# Patient Record
Sex: Female | Born: 1965
Health system: Southern US, Community
[De-identification: ages and names within clinical notes are randomized; demographics above are authoritative.]

## PROBLEM LIST (undated history)

## (undated) DIAGNOSIS — E7800 Pure hypercholesterolemia, unspecified: Secondary | ICD-10-CM

## (undated) DIAGNOSIS — R8789 Other abnormal findings in specimens from female genital organs: Secondary | ICD-10-CM

## (undated) DIAGNOSIS — M51369 Other intervertebral disc degeneration, lumbar region without mention of lumbar back pain or lower extremity pain: Secondary | ICD-10-CM

## (undated) DIAGNOSIS — M00062 Staphylococcal arthritis, left knee: Secondary | ICD-10-CM

## (undated) DIAGNOSIS — I2692 Saddle embolus of pulmonary artery without acute cor pulmonale: Secondary | ICD-10-CM

## (undated) DIAGNOSIS — M5136 Other intervertebral disc degeneration, lumbar region: Secondary | ICD-10-CM

## (undated) DIAGNOSIS — Z9289 Personal history of other medical treatment: Secondary | ICD-10-CM

## (undated) DIAGNOSIS — Z1211 Encounter for screening for malignant neoplasm of colon: Secondary | ICD-10-CM

## (undated) DIAGNOSIS — M5126 Other intervertebral disc displacement, lumbar region: Secondary | ICD-10-CM

## (undated) DIAGNOSIS — I10 Essential (primary) hypertension: Secondary | ICD-10-CM

## (undated) DIAGNOSIS — M1712 Unilateral primary osteoarthritis, left knee: Secondary | ICD-10-CM

## (undated) DIAGNOSIS — E118 Type 2 diabetes mellitus with unspecified complications: Secondary | ICD-10-CM

## (undated) DIAGNOSIS — I82409 Acute embolism and thrombosis of unspecified deep veins of unspecified lower extremity: Secondary | ICD-10-CM

## (undated) HISTORY — DX: Other intervertebral disc degeneration, lumbar region: M51.36

## (undated) HISTORY — DX: Other abnormal findings in specimens from female genital organs: R87.89

## (undated) HISTORY — DX: Other intervertebral disc degeneration, lumbar region without mention of lumbar back pain or lower extremity pain: M51.369

## (undated) HISTORY — DX: Encounter for screening for malignant neoplasm of colon: Z12.11

## (undated) HISTORY — DX: Essential (primary) hypertension: I10

## (undated) HISTORY — DX: Other intervertebral disc displacement, lumbar region: M51.26

## (undated) HISTORY — DX: Personal history of other medical treatment: Z92.89

---

## 2010-11-17 ENCOUNTER — Ambulatory Visit: Payer: Self-pay

## 2010-11-29 ENCOUNTER — Ambulatory Visit: Payer: Self-pay

## 2011-06-22 ENCOUNTER — Ambulatory Visit: Payer: Self-pay

## 2011-11-30 ENCOUNTER — Ambulatory Visit: Payer: Self-pay

## 2012-12-02 ENCOUNTER — Ambulatory Visit: Payer: Self-pay

## 2012-12-02 IMAGING — MG MM CAD DIAGNOSTIC MAMMO
1 series · 8 of 8 positions shown · non-contrast
Comparison: none

REASON FOR EXAM: LT BRST ASYMMETRY FU AND YRLY
COMMENTS:

PROCEDURE:     MAM - MAM DGTL DIAGNOSTIC MAMMO W/CAD  - [DATE]  [DATE]
RESULT:     COMPARISON:  [DATE], [DATE], [DATE]
TECHNIQUE: Digital screening mammograms were obtained. FDA approved
computer-aided detection (CAD) for mammography was utilized for this study.
BREAST COMPOSITION: The breast composition is SCATTERED FIBROGLANDULAR
TISSUE (glandular tissue is  25-50%)

[R CC · right · 8 of 8 slices shown]
[im 1/8]
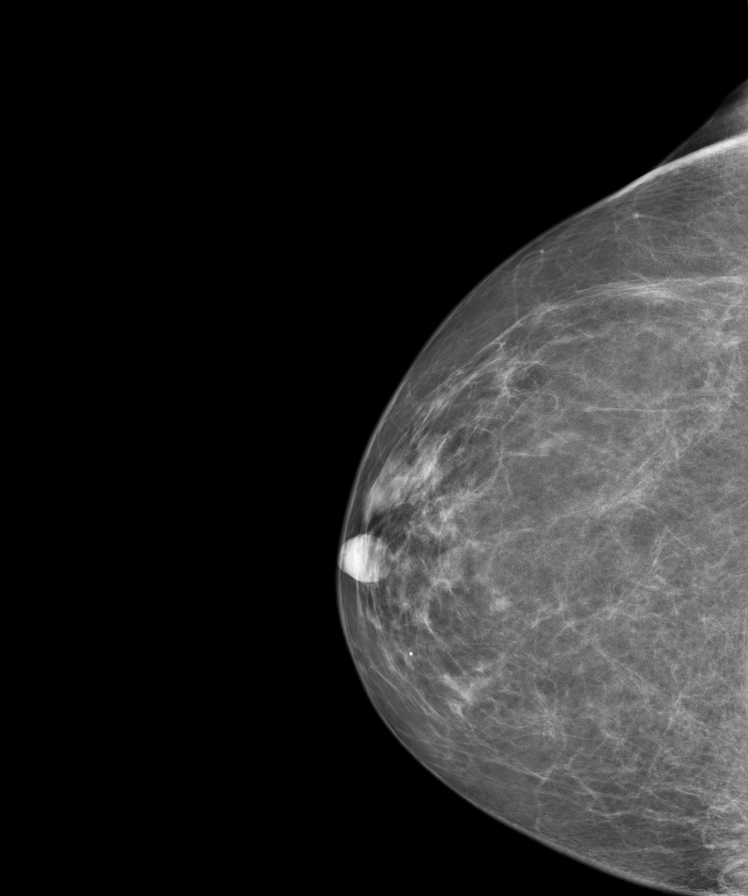
[im 2/8]
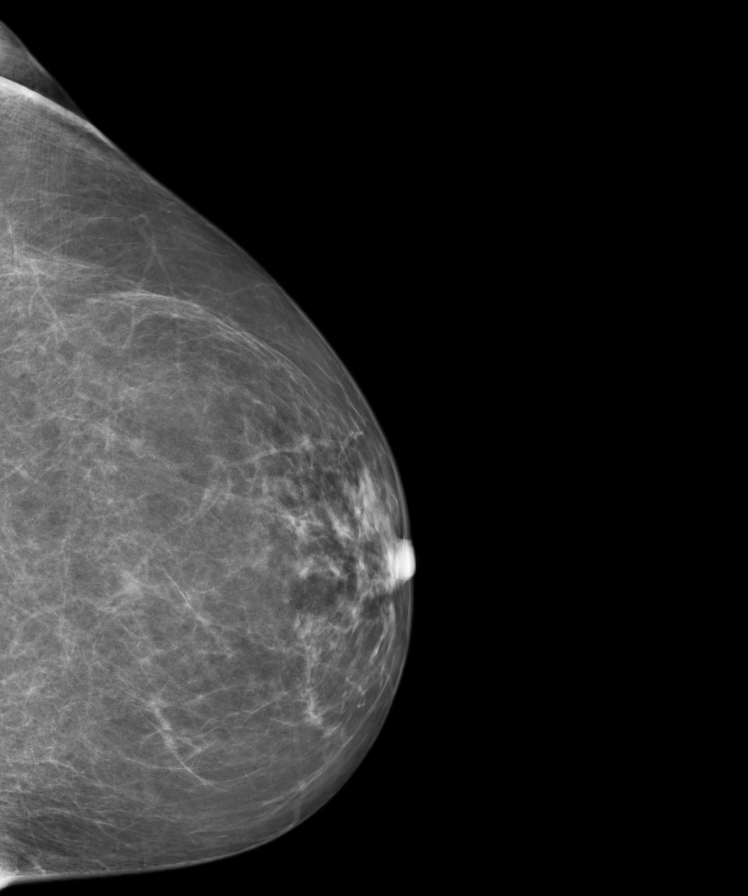
[im 3/8]
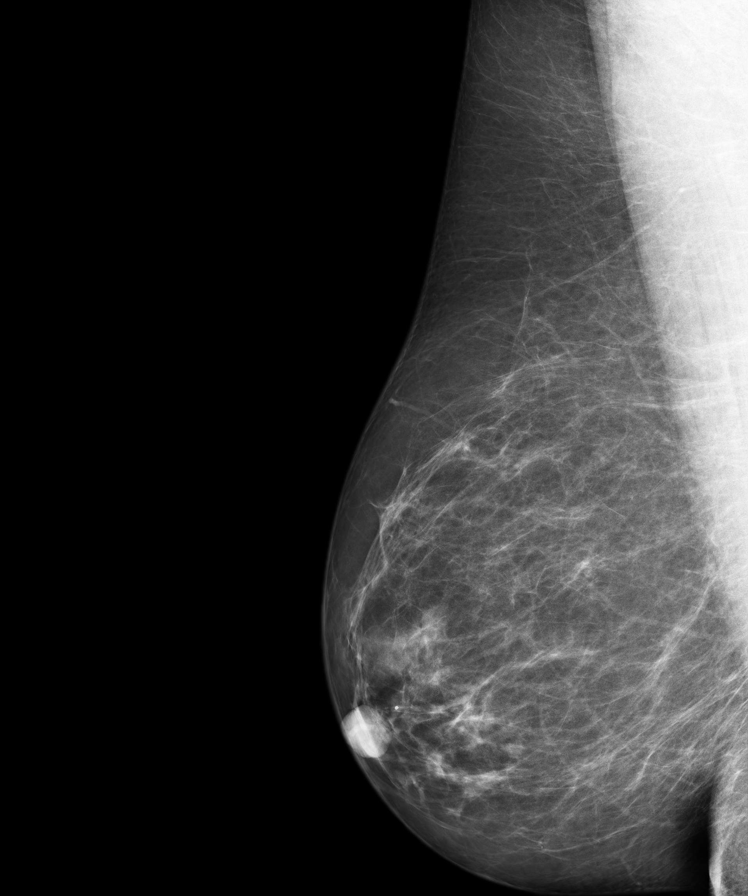
[im 4/8]
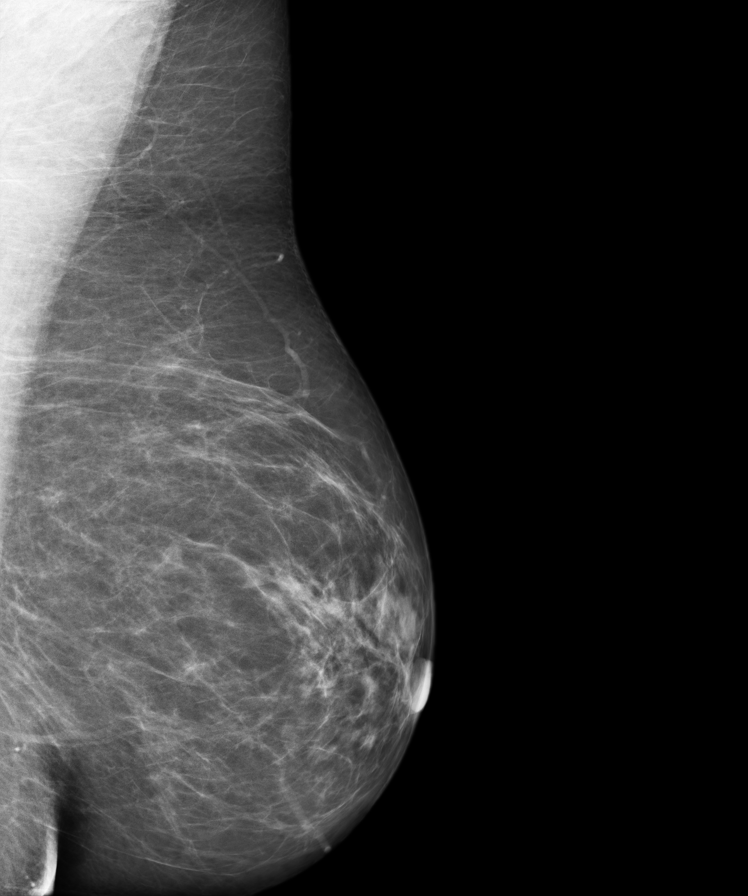
[im 5/8]
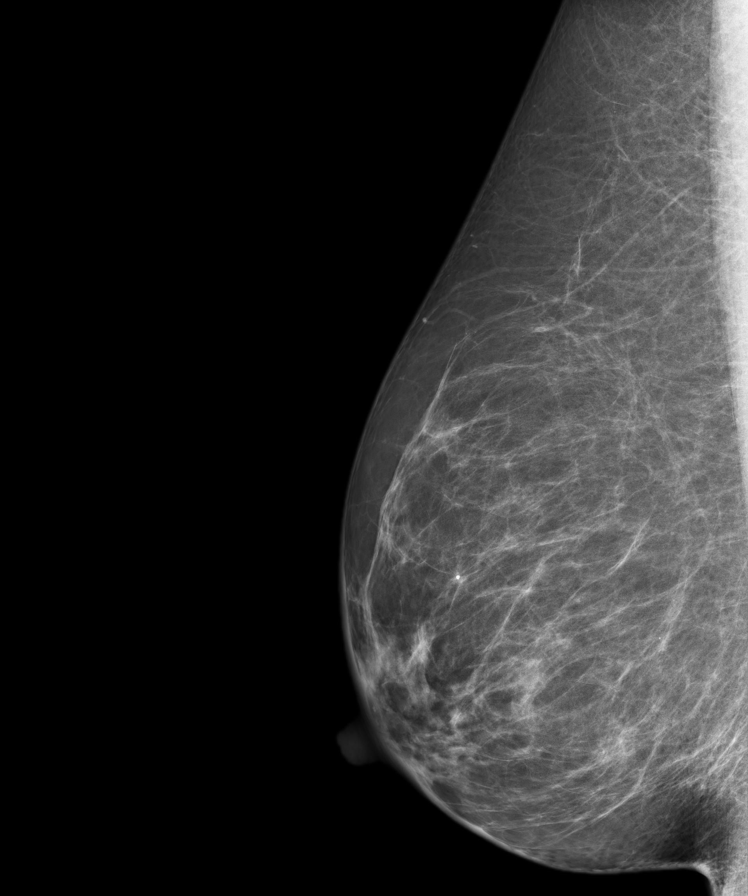
[im 6/8]
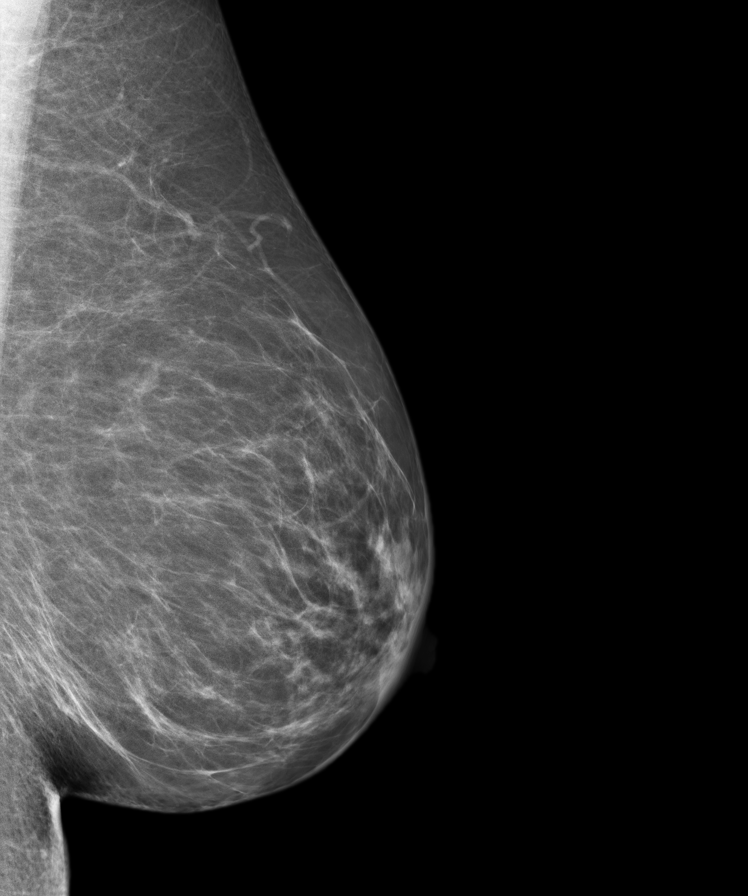
[im 7/8]
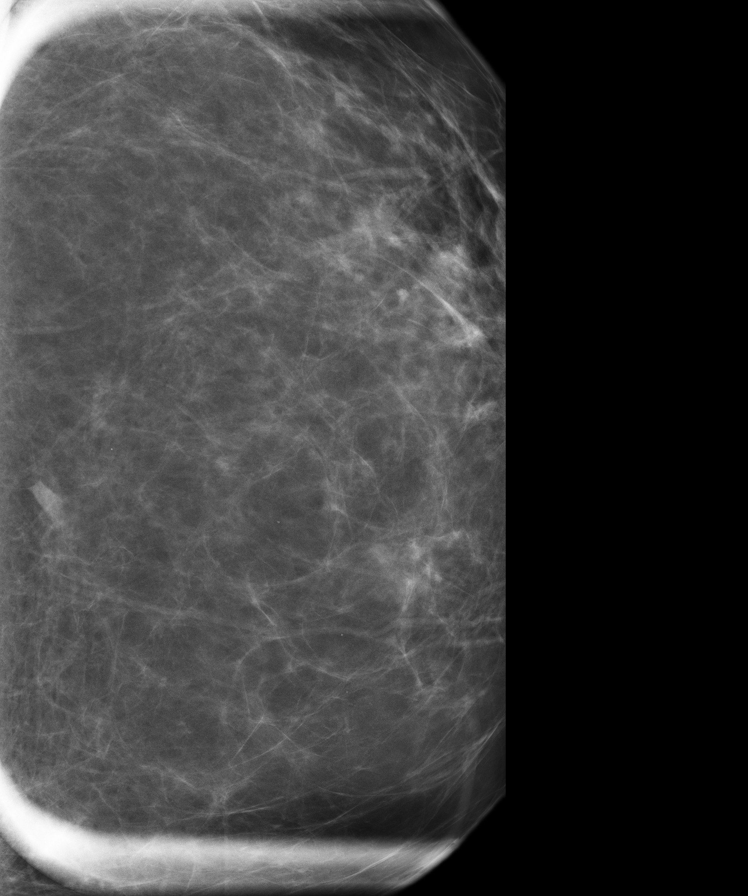
[im 8/8]
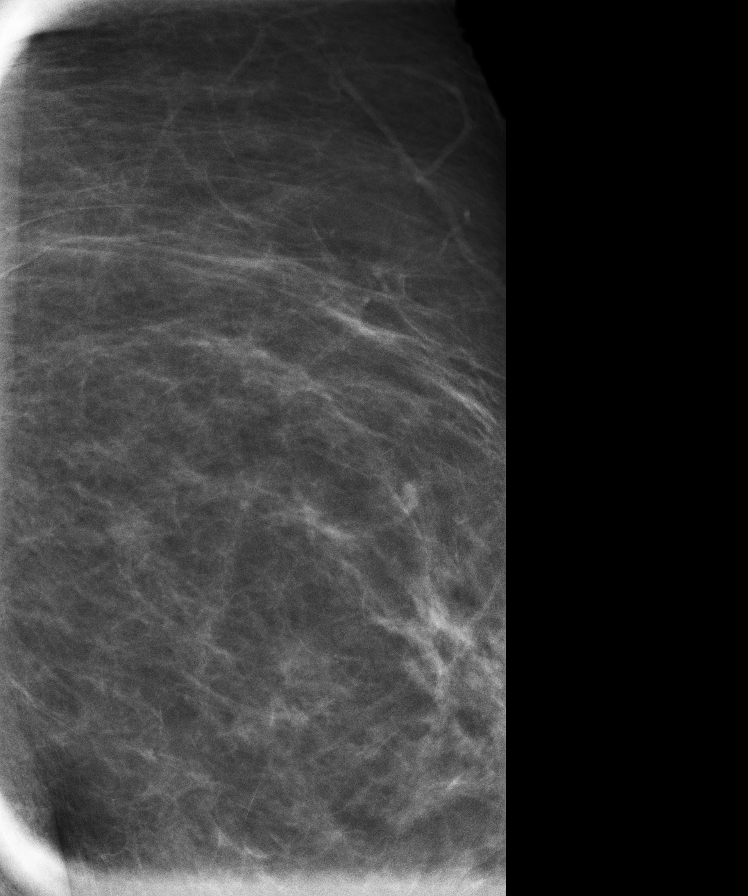

[8 of 8 positions shown; findings below may reference images not displayed]

FINDING: There is no dominant mass, architectural distortion or clusters of
suspicious microcalcifications.
IMPRESSION: 1.     Stable bilateral mammogram.
2.     Annual mammographic follow up recommended.

BI-RADS:  Category 1- Negative

A negative mammogram report does not preclude biopsy or other evaluation of
a clinically palpable or otherwise suspicious mass or lesion. Breast cancer
may not be detected by mammography in up to 10% of cases.

[REDACTED]

## 2014-05-31 ENCOUNTER — Ambulatory Visit: Payer: Self-pay | Admitting: Medical

## 2014-10-26 ENCOUNTER — Ambulatory Visit: Payer: Self-pay | Admitting: Podiatry

## 2014-11-04 ENCOUNTER — Ambulatory Visit (INDEPENDENT_AMBULATORY_CARE_PROVIDER_SITE_OTHER): Payer: BLUE CROSS/BLUE SHIELD

## 2014-11-04 ENCOUNTER — Encounter: Payer: Self-pay | Admitting: Podiatry

## 2014-11-04 ENCOUNTER — Ambulatory Visit (INDEPENDENT_AMBULATORY_CARE_PROVIDER_SITE_OTHER): Payer: BLUE CROSS/BLUE SHIELD | Admitting: Podiatry

## 2014-11-04 VITALS — BP 148/91 | HR 90 | Resp 16 | Ht 63.0 in | Wt 190.0 lb

## 2014-11-04 DIAGNOSIS — M2041 Other hammer toe(s) (acquired), right foot: Secondary | ICD-10-CM | POA: Diagnosis not present

## 2014-11-04 DIAGNOSIS — M21611 Bunion of right foot: Secondary | ICD-10-CM

## 2014-11-04 DIAGNOSIS — M2011 Hallux valgus (acquired), right foot: Secondary | ICD-10-CM

## 2014-11-04 DIAGNOSIS — I10 Essential (primary) hypertension: Secondary | ICD-10-CM | POA: Insufficient documentation

## 2014-11-04 NOTE — Progress Notes (Signed)
   Subjective:    Patient ID: Krista Blake, female    DOB: 1966/06/15, 49 y.o.   MRN: [id number]  HPI Comments: Right foot the second toe giving problems, i think my bunion is pushing my toe and now i have a hammertoe, been wrapping it   Foot Pain      Review of Systems  All other systems reviewed and are negative.      Objective:   Physical Exam: I have reviewed her past medical history medications allergies surgery social history and review of systems. Pulses are strongly palpable bilateral. Neurologic sensorium is intact per Semmes-Weinstein monofilament. Deep tendon reflexes intact bilateral and muscle strength +5 over 5 dorsiflexion plantar flexors and inverters and everters all intrinsic musculature is intact. Orthopedic evaluation demonstrates all joints distal to the ankle full range of motion without crepitation. 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 His hammertoe deformity is resulting in chronic deformity and early osteoarthritic changes of the PIPJ second digit right foot and that we can no longer straighten the toe to 180. Radiographic evaluation confirmed hallux abductovalgus deformity with an increase in the first intermetatarsal angle greater than normal value. An elongated metatarsal resulting in a plantarflexed second metatarsal and hammertoe deformity second right.        Assessment & Plan:  Assessment: Hallux abductovalgus deformity chronic capsulitis and hammertoe deformity second metatarsophalangeal joint and second toe right foot.  Plan: We discussed the etiology pathology conservative versus surgical therapies. We consented this patient today for an Grace Isaac with osteotomy with screw fixation a second metatarsal osteotomy with screw fixation and a hammertoe repair with screw fixation. I answered  all questions regarding these procedures to the best of my ability in layman's terms. We did discuss the possible postop complications which may include but are not limited to. Pain bleeding swelling infection overcorrection and under correction need for further surgery. I answered all of these questions. She was dispensed a cam walker today prior to her leaving and she would like to have this done in the near future. I will follow up with her as needed.

## 2014-11-05 ENCOUNTER — Telehealth: Payer: Self-pay | Admitting: *Deleted

## 2014-11-05 NOTE — Telephone Encounter (Signed)
"  I was in yesterday and scheduled a surgery for February 5th.  Anyway, I just have a couple more questions about the tingling, numbness sensation in my foot.  I didn't go over that with him at all.  I have questions about that, if someone would call me back sometime today.  I just had that on my mind, it's just a quick question."

## 2014-11-05 NOTE — Telephone Encounter (Signed)
Spoke to Krista Blake about the tingle in the toe , she wanted to know if that will ever go away, explained to her that it will go away after surgery , it may not happen right away but it will come back.  Her main concern was about the tingle,

## 2014-11-05 NOTE — Telephone Encounter (Signed)
"  I called and left a message earlier this morning and it has dawned on me that I have not heard from anyone.  It's about 3:00pm.  I'm just following up.  I had an appointment yesterday.  I had a few questions I forgot to ask."

## 2014-11-18 ENCOUNTER — Telehealth: Payer: Self-pay | Admitting: Podiatry

## 2014-11-18 NOTE — Telephone Encounter (Signed)
Patient called needing to cancel surgery for the time being. Will call back to reschedule.

## 2014-12-02 ENCOUNTER — Encounter: Payer: Self-pay | Admitting: Podiatry

## 2015-02-05 DIAGNOSIS — R87618 Other abnormal cytological findings on specimens from cervix uteri: Secondary | ICD-10-CM

## 2015-02-05 HISTORY — DX: Other abnormal cytological findings on specimens from cervix uteri: R87.618

## 2015-02-17 ENCOUNTER — Other Ambulatory Visit: Payer: Self-pay | Admitting: Obstetrics and Gynecology

## 2015-02-17 DIAGNOSIS — Z1231 Encounter for screening mammogram for malignant neoplasm of breast: Secondary | ICD-10-CM

## 2015-03-04 ENCOUNTER — Ambulatory Visit
Admission: RE | Admit: 2015-03-04 | Discharge: 2015-03-04 | Disposition: A | Discharge status: Home / Self Care | Payer: BLUE CROSS/BLUE SHIELD | Source: Ambulatory Visit | Attending: Obstetrics and Gynecology | Admitting: Obstetrics and Gynecology

## 2015-03-04 DIAGNOSIS — Z1231 Encounter for screening mammogram for malignant neoplasm of breast: Secondary | ICD-10-CM | POA: Insufficient documentation

## 2015-03-04 DIAGNOSIS — Z9289 Personal history of other medical treatment: Secondary | ICD-10-CM

## 2015-03-04 HISTORY — DX: Personal history of other medical treatment: Z92.89

## 2015-03-04 IMAGING — MG MM DIGITAL SCREENING BILAT W/ CAD
5 series · 5 of 5 positions shown · non-contrast
Comparison: Previous exam(s).

CLINICAL DATA: Screening.

EXAM:
DIGITAL SCREENING BILATERAL MAMMOGRAM WITH CAD

[L MLO (1 of 2)]
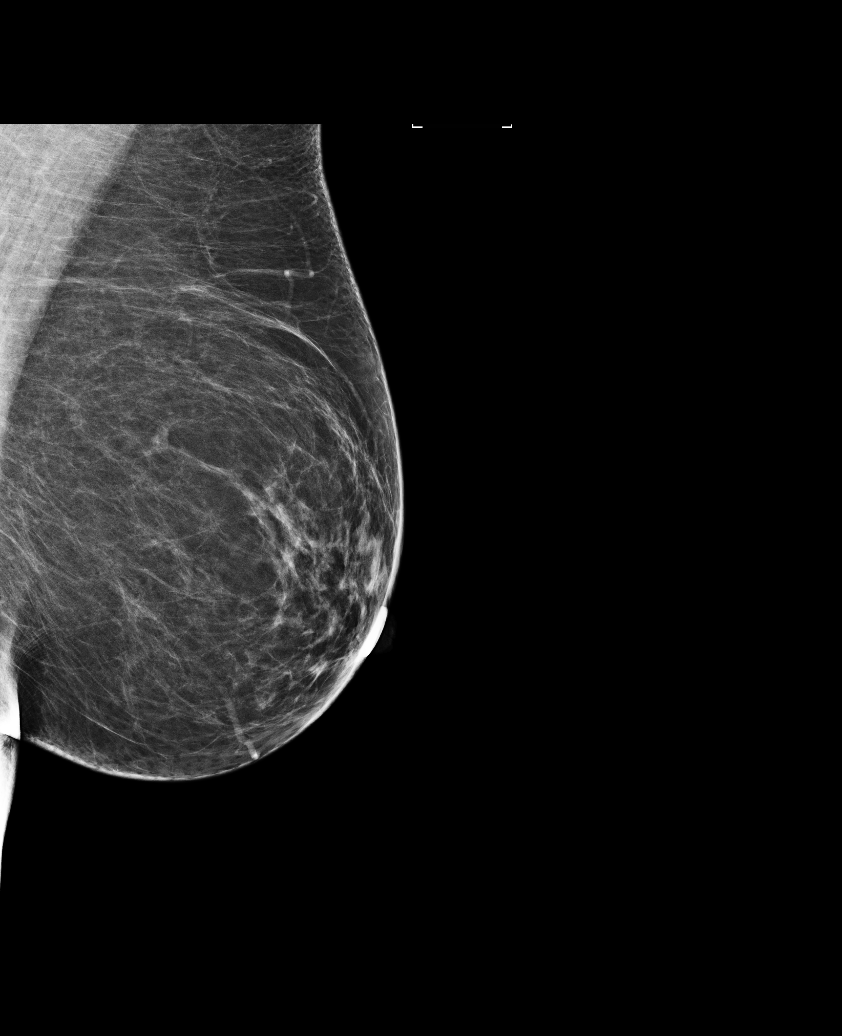

[R MLO]
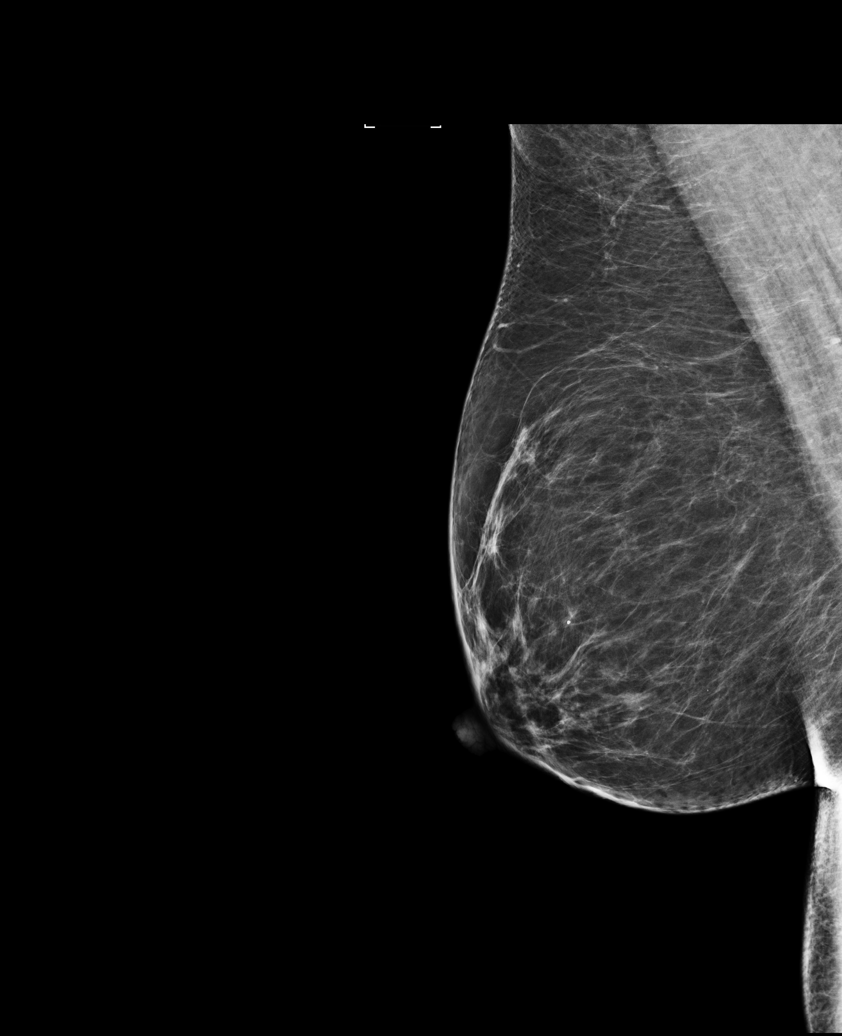

[L MLO (2 of 2)]
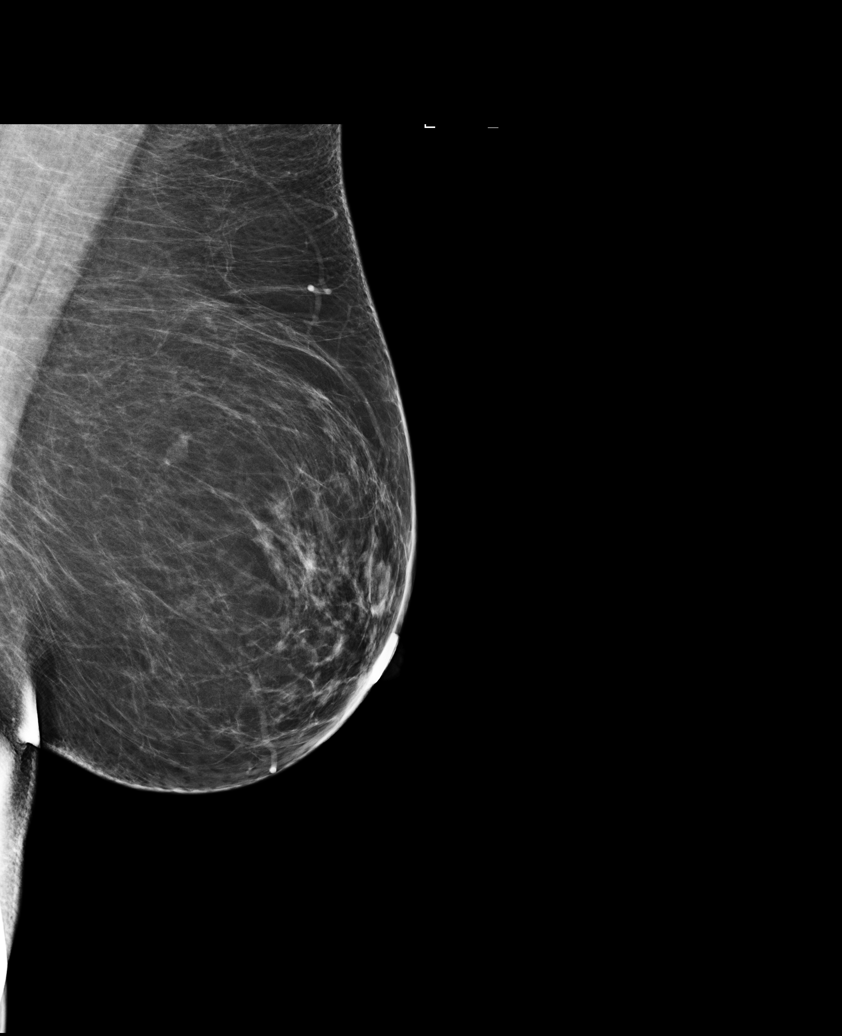

[R CC]
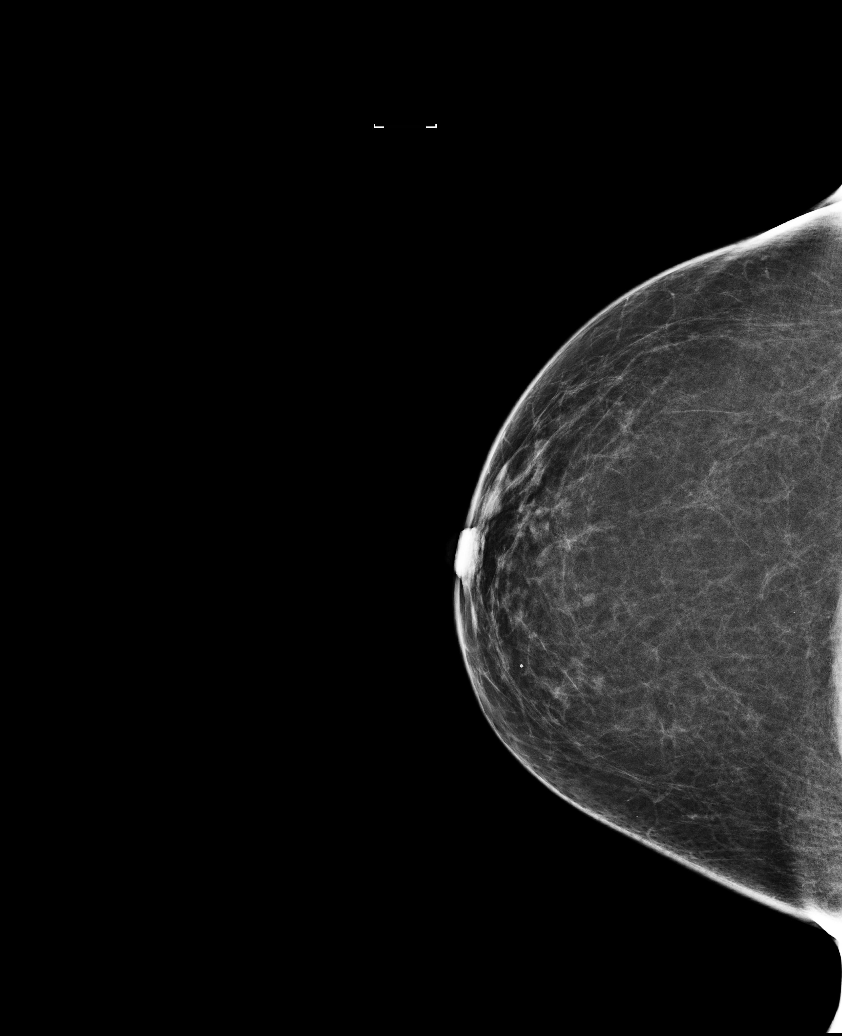

[L CC]
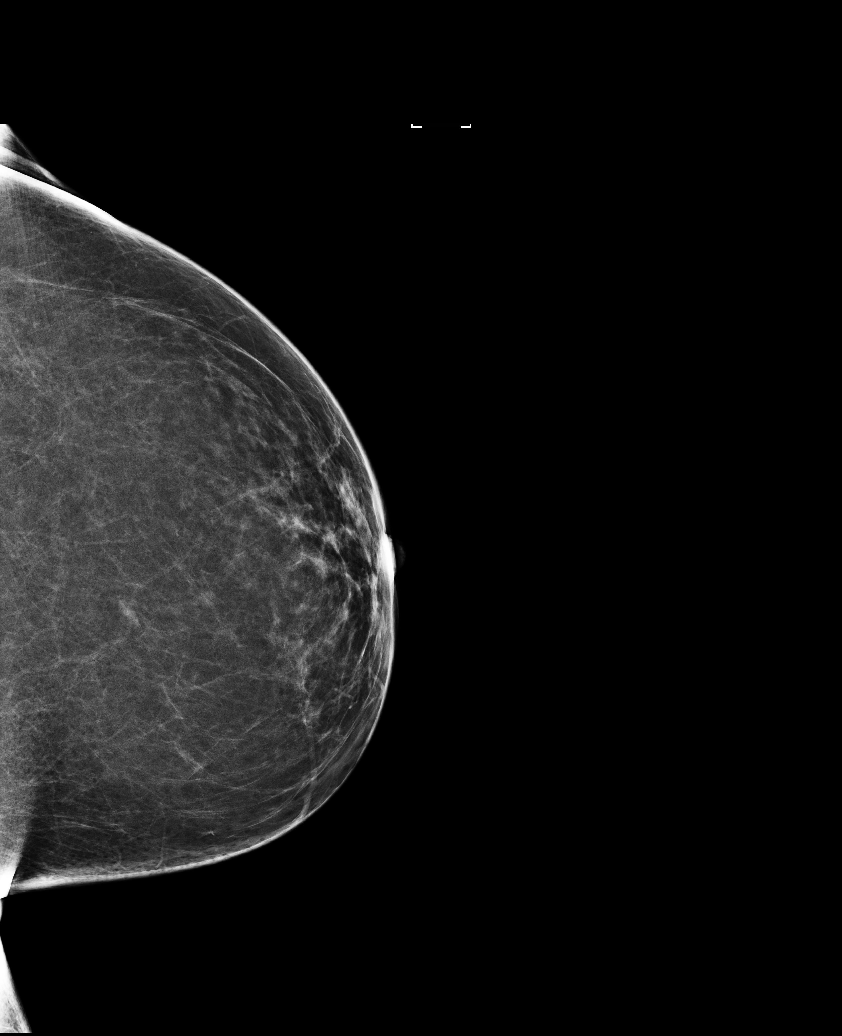

[5 of 5 positions shown; findings below may reference images not displayed]

ACR Breast Density Category b: There are scattered areas of
fibroglandular density.
FINDINGS: There are no findings suspicious for malignancy. Images were
processed with CAD.
IMPRESSION: No mammographic evidence of malignancy. A result letter of this
screening mammogram will be mailed directly to the patient.

RECOMMENDATION:
Screening mammogram in one year. (Code:[US])

BI-RADS CATEGORY  1: Negative.

## 2015-03-28 ENCOUNTER — Ambulatory Visit
Admission: EM | Admit: 2015-03-28 | Discharge: 2015-03-28 | Disposition: A | Discharge status: Home / Self Care | Payer: BLUE CROSS/BLUE SHIELD | Attending: Family Medicine | Admitting: Family Medicine

## 2015-03-28 DIAGNOSIS — M25512 Pain in left shoulder: Secondary | ICD-10-CM

## 2015-03-28 DIAGNOSIS — M7522 Bicipital tendinitis, left shoulder: Secondary | ICD-10-CM

## 2015-03-28 DIAGNOSIS — M6248 Contracture of muscle, other site: Secondary | ICD-10-CM | POA: Diagnosis not present

## 2015-03-28 DIAGNOSIS — M62838 Other muscle spasm: Secondary | ICD-10-CM

## 2015-03-28 MED ORDER — KETOROLAC TROMETHAMINE 60 MG/2ML IM SOLN
60.0000 mg | Freq: Once | INTRAMUSCULAR | Status: AC
  Administered 2015-03-28: 60 mg via INTRAMUSCULAR

## 2015-03-28 MED ORDER — NAPROXEN 500 MG PO TABS
500.0000 mg | ORAL_TABLET | Freq: Two times a day (BID) | ORAL | Status: DC
Start: 2015-03-28 — End: 2015-10-02

## 2015-03-28 MED ORDER — METAXALONE 800 MG PO TABS: 800.0000 mg | ORAL_TABLET | Freq: Three times a day (TID) | ORAL | Status: DC

## 2015-03-28 NOTE — ED Provider Notes (Signed)
CSN: [id number]     Arrival date & time 03/28/15  1146 History   First MD Initiated Contact with Patient 03/28/15 1252     Chief Complaint  Patient presents with  . 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  Has tried ice, massage, stretching, massage roller but still having trapezius spasms and shoulder/scapular pain with arm movement.  Crying due to pain at night.  Tingling down left arm.  Patient is a 49 y.o. female presenting with shoulder pain. The history is provided by the patient.  Shoulder Pain Location:  Shoulder Time since incident:  1 week Pain details:    Quality:  Aching, burning, tingling and shooting   Radiates to:  L arm and L fingers   Severity:  Moderate   Onset quality:  Gradual   Duration:  1 week   Timing:  Constant   Progression:  Worsening Chronicity:  New Handedness:  Right-handed Dislocation: no   Foreign body present:  No foreign bodies Tetanus status:  Out of date Prior injury to area:  No Relieved by:  Nothing Worsened by:  Movement Ineffective treatments:  Being still, NSAIDs, immobilization, rest and ice Associated symptoms: back pain, decreased range of motion, neck pain and tingling   Associated symptoms: no fatigue, no fever, no muscle weakness, no numbness, no stiffness and no swelling   Risk factors: no concern for non-accidental trauma, no known bone disorder, no frequent fractures and no recent illness     History reviewed. No pertinent past medical history. History reviewed. No pertinent past surgical history. Family History  Problem Relation Age of Onset  . Hypertension Father    History  Substance Use Topics  . Smoking status: Never Smoker   . Smokeless tobacco: Not on file  . Alcohol Use: 0.0 oz/week    0 Standard drinks or equivalent per week   OB History    No data  available     Review of Systems  Constitutional: Negative for fever, chills, diaphoresis, activity change, appetite change and fatigue.  HENT: Positive for postnasal drip. Negative for congestion, dental problem, drooling, ear discharge, ear pain, facial swelling, hearing loss, mouth sores, nosebleeds and trouble swallowing.   Eyes: Negative for pain, discharge, redness and itching.  Respiratory: Negative for cough, shortness of breath, wheezing and stridor.   Cardiovascular: Negative for chest pain, palpitations and leg swelling.  Gastrointestinal: Negative for nausea, vomiting, abdominal pain, diarrhea, constipation, blood in stool and abdominal distention.  Endocrine: Negative for cold intolerance and heat intolerance.  Genitourinary: Negative for hematuria and difficulty urinating.  Musculoskeletal: Positive for myalgias, back pain and neck pain. Negative for joint swelling, arthralgias, gait problem, stiffness and neck stiffness.  Allergic/Immunologic: Negative for environmental allergies and food allergies.  Neurological: Negative for dizziness, tremors, seizures, syncope, facial asymmetry, speech difficulty, weakness, light-headedness, numbness and headaches.  Hematological: Negative for adenopathy. Does not bruise/bleed easily.  Psychiatric/Behavioral: Positive for sleep disturbance. Negative for behavioral problems, confusion and agitation.    Allergies  Penicillins  Home Medications   Prior to Admission medications   Medication Sig Start Date End Date Taking? Authorizing Provider  CAMILA 0.35 MG tablet  10/29/14  Yes Historical Provider, MD  metaxalone (SKELAXIN) 800 MG tablet Take 1 tablet (800 mg total) by mouth 3 (three) times daily. 03/28/15   Olen Cordial, NP  naproxen (NAPROSYN) 500 MG tablet Take 1 tablet (500 mg total) by mouth 2 (two)  times daily with a meal. 03/28/15   Aura Fey Betancourt, NP   BP 141/91 mmHg  Pulse 80  Temp(Src) 98.4 F (36.9 C) (Tympanic)  Resp 16   Ht 5\' 4"  (2.951 m)  Wt 190 lb (86.183 kg)  BMI 32.60 kg/m2  SpO2 98%  LMP 02/22/2015 (Approximate) Physical Exam  Constitutional: She is oriented to person, place, and time. Vital signs are normal. She appears well-developed and well-nourished. No distress.  HENT:  Head: Normocephalic and atraumatic.  Right Ear: External ear normal.  Left Ear: External ear normal.  Nose: Nose normal.  Mouth/Throat: Oropharynx is clear and moist. No oropharyngeal exudate.  Eyes: Conjunctivae, EOM and lids are normal. Pupils are equal, round, and reactive to light. Right eye exhibits no discharge. Left eye exhibits no discharge. No scleral icterus.  Neck: Trachea normal and normal range of motion. Neck supple. No tracheal deviation present.  Cardiovascular: Normal rate, regular rhythm, normal heart sounds and intact distal pulses.  Exam reveals no gallop and no friction rub.   No murmur heard. Pulmonary/Chest: Effort normal and breath sounds normal. No respiratory distress. She has no wheezes. She has no rales. She exhibits no tenderness.  Abdominal: Soft. She exhibits no distension. There is no tenderness.  Musculoskeletal: She exhibits tenderness. She exhibits no edema.       Right shoulder: Normal.       Left shoulder: She exhibits decreased range of motion, tenderness and pain. She exhibits no bony tenderness, no swelling, no effusion, no crepitus, no deformity, no laceration, no spasm, normal pulse and normal strength.       Cervical back: She exhibits decreased range of motion, tenderness, pain and spasm. She exhibits no bony tenderness, no swelling, no edema, no deformity, no laceration and normal pulse.       Arms: ttp bilateral paraspinal muscles and trapezius muscles left greater than right; pain with abduction, adduction greater than 180, empty can left; pain with c-spine rotation right and decreased AROM  Lymphadenopathy:    She has no cervical adenopathy.  Neurological: She is alert and  oriented to person, place, and time. She has normal reflexes. She displays normal reflexes. Coordination normal.  Skin: Skin is warm, dry and intact. No rash noted. She is not diaphoretic. No erythema. No pallor.  Psychiatric: She has a normal mood and affect. Her speech is normal and behavior is normal. Judgment and thought content normal. Cognition and memory are normal.  Nursing note and vitals reviewed.   ED Course  Procedures (including critical care time) Labs Review Labs Reviewed - No data to display  Imaging Review No results found.  13:11 Medication Given LW  ketorolac (TORADOL) injection 60 mg - Dose: 60 mg ; Route: Intramuscular ; Site: Left Upper Outer Quadrant ; Scheduled Time: 8841      13:13:54 Orders Acknowledged LW  New - ketorolac (TORADOL) injection 60 mg     Patient reported decreased pain and feeling much better after toradol injection.  Discharged to home. MDM   1. Tendonitis, bicipital, left   2. Trapezius muscle spasm   3. Shoulder pain, acute, left    Naproxen 500mg  po BID x2 weeks.  Skelaxin po TID prn muscle spasms.  Home stretches demonstrated to patient-e.g. Arm circles, walking up wall, chest stretches, neck AROM, chin tucks, knee to chest and rock side to side on back.  Consider physical therapy referral if no improvement with prescribed therapy.  Ensure ergonomics correct desk at work avoid repetitive motions if  possible/holding phone/laptop in hand use desk/stand.  Patient was instructed to rest, ice, and ROM exercises.  Activity as tolerated.  Avoid alcohol intake while taking skelaxin.  Follow up if symptoms persist or worsen.  Patient verbalized agreement and understanding of treatment plan.  P2:  Injury Prevention and Fitness.    Olen Cordial, NP 03/28/15 1651

## 2015-03-28 NOTE — ED Notes (Signed)
Started last Sunday with left scapular pain radiating to left shoulder and radiates down to hand. Denies trauma

## 2015-03-28 NOTE — Discharge Instructions (Signed)
Shoulder Pain The shoulder is the joint that connects your arms to your body. The bones that form the shoulder joint include the upper arm bone (humerus), the shoulder blade (scapula), and the collarbone (clavicle). The top of the humerus is shaped like a ball and fits into a rather flat socket on the scapula (glenoid cavity). A combination of muscles and strong, fibrous tissues that connect muscles to bones (tendons) support your shoulder joint and hold the ball in the socket. Small, fluid-filled sacs (bursae) are located in different areas of the joint. They act as cushions between the bones and the overlying soft tissues and help reduce friction between the gliding tendons and the bone as you move your arm. Your shoulder joint allows a wide range of motion in your arm. This range of motion allows you to do things like scratch your back or throw a ball. However, this range of motion also makes your shoulder more prone to pain from overuse and injury. Causes of shoulder pain can originate from both injury and overuse and usually can be grouped in the following four categories:  Redness, swelling, and pain (inflammation) of the tendon (tendinitis) or the bursae (bursitis).  Instability, such as a dislocation of the joint.  Inflammation of the joint (arthritis).  Broken bone (fracture). HOME CARE INSTRUCTIONS   Apply ice to the sore area.  Put ice in a plastic bag.  Place a towel between your skin and the bag.  Leave the ice on for 15-20 minutes, 3-4 times per day for the first 2 days, or as directed by your health care provider.  Stop using cold packs if they do not help with the pain.  If you have a shoulder sling or immobilizer, wear it as long as your caregiver instructs. Only remove it to shower or bathe. Move your arm as little as possible, but keep your hand moving to prevent swelling.  Squeeze a soft ball or foam pad as much as possible to help prevent swelling.  Only take  over-the-counter or prescription medicines for pain, discomfort, or fever as directed by your caregiver. SEEK MEDICAL CARE IF:   Your shoulder pain increases, or new pain develops in your arm, hand, or fingers.  Your hand or fingers become cold and numb.  Your pain is not relieved with medicines. SEEK IMMEDIATE MEDICAL CARE IF:   Your arm, hand, or fingers are numb or tingling.  Your arm, hand, or fingers are significantly swollen or turn white or blue. MAKE SURE YOU:   Understand these instructions.  Will watch your condition.  Will get help right away if you are not doing well or get worse. Document Released: 07/19/2005 Document Revised: 02/23/2014 Document Reviewed: 09/23/2011 [facility] Patient Information 2015 [place], [place]. This information is not intended to replace advice given to you by your health care provider. Make sure you discuss any questions you have with your health care provider. Bicipital Tendonitis Bicipital tendonitis refers to redness, soreness, and swelling (inflammation) or irritation of the bicep tendon. The biceps muscle is located between the elbow and shoulder of the inner arm. The tendon heads, similar to pieces of rope, connect the bicep muscle to the shoulder socket. They are called short head and long head tendons. When tendonitis occurs, the long head tendon is inflamed and swollen, and may be thickened or partially torn.  Bicipital tendonitis can occur with other problems as well, such as arthritis in the shoulder or acromioclavicular joints, tears in the tendons, or other rotator  cuff problems.  CAUSES  Overuse of of the arms for overhead activities is the major cause of tendonitis. Many athletes, such as swimmers, baseball players, and tennis players are prone to bicipital tendonitis. Jobs that require manual labor or routine chores, especially chores involving overhead activities can result in overuse and tendonitis. SYMPTOMS Symptoms may  include:  Pain in and around the front of the shoulder. Pain may be worse with overhead motion.  Pain or aching that radiates down the arm.  Clicking or shifting sensations in the shoulder. DIAGNOSIS Your caregiver may perform the following:  Physical exam and tests of the biceps and shoulder to observe range of motion, strength, and stability.  X-rays or magnetic resonance imaging (MRI) to confirm the diagnosis. In most common cases, these tests are not necessary. Since other problems may exist in the shoulder or rotator cuff, additional tests may be recommended. TREATMENT Treatment may include the following:  Medications  Your caregiver may prescribe over-the-counter pain relievers.  Steroid injections, such as cortisone, may be recommended. These may help to reduce inflammation and pain.  Physical Therapy - Your caregiver may recommend gentle exercises with the arm. These can help restore strength and range of motion. They may be done at home or with a physical therapist's supervision and input.  Surgery - Arthroscopic or open surgery sometimes is necessary. Surgery may include:  Reattachment or repair of the tendon at the shoulder socket.  Removal of the damaged section of the tendon.  Anchoring the tendon to a different area of the shoulder (tenodesis). HOME CARE INSTRUCTIONS   Avoid overhead motion of the affected arm or any other motion that causes pain.  Take medication for pain as directed. Do not take these for more than 3 weeks, unless directed to do so by your caregiver.  Ice the affected area for 20 minutes at a time, 3-4 times per day. Place a towel on the skin over the painful area and the ice or cold pack over the towel. Do not place ice directly on the skin.  Perform gentle exercises at home as directed. These will increase strength and flexibility. PREVENTION  Modify your activities as much as possible to protect your arm. A physical therapist or sports  medicine physician can help you understand options for safe motion.  Avoid repetitive overhead pulling, lifting, reaching, and throwing until your caregiver tells you it is ok to resume these activities. SEEK MEDICAL CARE IF:  Your pain worsens.  You have difficulty moving the affected arm.  You have trouble performing any of the self-care instructions. MAKE SURE YOU:   Understand these instructions.  Will watch your condition.  Will get help right away if you are not doing well or get worse. Document Released: 11/11/2010 Document Revised: 01/01/2012 Document Reviewed: 11/11/2010 [facility] Patient Information 2015 [place], [place]. This information is not intended to replace advice given to you by your health care provider. Make sure you discuss any questions you have with your health care provider. Muscle Cramps and Spasms Muscle cramps and spasms occur when a muscle or muscles tighten and you have no control over this tightening (involuntary muscle contraction). They are a common problem and can develop in any muscle. The most common place is in the calf muscles of the leg. Both muscle cramps and muscle spasms are involuntary muscle contractions, but they also have differences:   Muscle cramps are sporadic and painful. They may last a few seconds to a quarter of an hour. Muscle cramps  are often more forceful and last longer than muscle spasms.  Muscle spasms may or may not be painful. They may also last just a few seconds or much longer. CAUSES  It is uncommon for cramps or spasms to be due to a serious underlying problem. In many cases, the cause of cramps or spasms is unknown. Some common causes are:   Overexertion.   Overuse from repetitive motions (doing the same thing over and over).   Remaining in a certain position for a long period of time.   Improper preparation, form, or technique while performing a sport or activity.   Dehydration.   Injury.   Side effects of  some medicines.   Abnormally low levels of the salts and ions in your blood (electrolytes), especially potassium and calcium. This could happen if you are taking water pills (diuretics) or you are pregnant.  Some underlying medical problems can make it more likely to develop cramps or spasms. These include, but are not limited to:   Diabetes.   Parkinson disease.   Hormone disorders, such as thyroid problems.   Alcohol abuse.   Diseases specific to muscles, joints, and bones.   Blood vessel disease where not enough blood is getting to the muscles.  HOME CARE INSTRUCTIONS   Stay well hydrated. Drink enough water and fluids to keep your urine clear or pale yellow.  It may be helpful to massage, stretch, and relax the affected muscle.  For tight or tense muscles, use a warm towel, heating pad, or hot shower water directed to the affected area.  If you are sore or have pain after a cramp or spasm, applying ice to the affected area may relieve discomfort.  Put ice in a plastic bag.  Place a towel between your skin and the bag.  Leave the ice on for 15-20 minutes, 03-04 times a day.  Medicines used to treat a known cause of cramps or spasms may help reduce their frequency or severity. Only take over-the-counter or prescription medicines as directed by your caregiver. SEEK MEDICAL CARE IF:  Your cramps or spasms get more severe, more frequent, or do not improve over time.  MAKE SURE YOU:   Understand these instructions.  Will watch your condition.  Will get help right away if you are not doing well or get worse. Document Released: 03/31/2002 Document Revised: 02/03/2013 Document Reviewed: 09/25/2012 [facility] Patient Information 2015 [place], [place]. This information is not intended to replace advice given to you by your health care provider. Make sure you discuss any questions you have with your health care provider.

## 2015-10-02 ENCOUNTER — Encounter: Payer: Self-pay | Admitting: Emergency Medicine

## 2015-10-02 ENCOUNTER — Ambulatory Visit
Admission: EM | Admit: 2015-10-02 | Discharge: 2015-10-02 | Disposition: A | Discharge status: Home / Self Care | Payer: BLUE CROSS/BLUE SHIELD | Attending: Family Medicine | Admitting: Family Medicine

## 2015-10-02 DIAGNOSIS — J019 Acute sinusitis, unspecified: Secondary | ICD-10-CM

## 2015-10-02 DIAGNOSIS — H6593 Unspecified nonsuppurative otitis media, bilateral: Secondary | ICD-10-CM

## 2015-10-02 DIAGNOSIS — N3090 Cystitis, unspecified without hematuria: Secondary | ICD-10-CM

## 2015-10-02 LAB — URINALYSIS COMPLETE WITH MICROSCOPIC (ARMC ONLY)
Glucose, UA: NEGATIVE mg/dL
KETONES UR: NEGATIVE mg/dL
Leukocytes, UA: NEGATIVE
Nitrite: NEGATIVE
PH: 7.5 (ref 5.0–8.0)
Specific Gravity, Urine: 1.02 (ref 1.005–1.030)
WBC, UA: NONE SEEN WBC/hpf (ref 0–5)

## 2015-10-02 MED ORDER — FLUTICASONE PROPIONATE 50 MCG/ACT NA SUSP: 1.0000 | Freq: Two times a day (BID) | NASAL | Status: DC

## 2015-10-02 MED ORDER — FLUCONAZOLE 200 MG PO TABS: 200.0000 mg | ORAL_TABLET | Freq: Every day | ORAL | Status: AC

## 2015-10-02 MED ORDER — SALINE SPRAY 0.65 % NA SOLN: 2.0000 | NASAL | Status: DC

## 2015-10-02 MED ORDER — ACETAMINOPHEN 500 MG PO TABS: 500.0000 mg | ORAL_TABLET | Freq: Four times a day (QID) | ORAL | Status: AC | PRN

## 2015-10-02 NOTE — ED Notes (Signed)
Patient c/o left lower abdominal pain that started yesterday.  Patient denies N/V/D.  Patient reports bowel movement yesterday and was soft not hard.  Patient denies fevers.

## 2015-10-02 NOTE — ED Provider Notes (Addendum)
CSN: [id number]     Arrival date & time 10/02/15  0804 History   First MD Initiated Contact with Patient 10/02/15 [phone]     Chief Complaint  Patient presents with  . Abdominal Pain   (Consider location/radiation/quality/duration/timing/severity/associated sxs/prior Treatment) HPI Comments: Single caucasian female here for evaluation of LLQ pain that came on suddenly 1 pm yesterday noticed increased bloating, belching and flatulence; had one stool unformed brown soft yesterday denied blood black or red "feels like you are wringing out/twisting a towel" "difficult to get in/out of car worsens pain"  Has been nonproductive cough for past week and sinus pain/pressure menstrual cycle due next week past few years has had intermittent cramps with menstrual cycle yesterday thought this was her menstrual cycle but not getting any better this am so came in for evaluation on oral contraceptives  Her stool pattern irregular every 3/5/or 7 days unsure how many days from previous BM this week usually formed/brown; pain worse at night denied urinary frequency/changes in urine color/smell/back pain/swelling hands/feet/fever/chills/rash/SOB PMHx kidney infections as a child  FHx: F-HTN, hyperlipidemia  Patient is a 49 y.o. female presenting with abdominal pain. The history is provided by the patient.  Abdominal Pain Pain location:  LLQ Pain quality: bloating, pressure, squeezing and tugging   Pain quality: not aching, not burning, not cramping, not dull, no fullness, not gnawing, not heavy, not sharp, not shooting, not stabbing, no stiffness, not tearing and not throbbing   Pain radiates to:  Does not radiate Pain severity:  Moderate Onset quality:  Sudden Duration:  1 day Timing:  Constant Progression:  Waxing and waning Chronicity:  New Context: alcohol use   Context: not awakening from sleep, not diet changes, not eating, not laxative use, not medication withdrawal, not previous surgeries, not recent  illness, not recent sexual activity, not recent travel, not retching, not sick contacts, not suspicious food intake and not trauma   Relieved by:  Nothing Worsened by:  Movement Ineffective treatments:  None tried Associated symptoms: belching, cough, diarrhea and flatus   Associated symptoms: no anorexia, no chest pain, no chills, no constipation, no dysuria, no fatigue, no fever, no hematemesis, no hematochezia, no hematuria, no melena, no nausea, no shortness of breath, no sore throat, no vaginal bleeding, no vaginal discharge and no vomiting   Cough:    Cough characteristics:  Non-productive   Severity:  Moderate   Duration:  1 week   Progression:  Unchanged   Chronicity:  Recurrent Diarrhea:    Quality:  Semi-solid   Number of occurrences:  1   Severity:  Moderate   Duration:  1 day   Timing:  Rare   Progression:  Resolved Risk factors: no alcohol abuse, no aspirin use, not elderly, has not had multiple surgeries, no NSAID use, not obese, not pregnant and no recent hospitalization     History reviewed. No pertinent past medical history. History reviewed. No pertinent past surgical history. Family History  Problem Relation Age of Onset  . Hypertension Father    Social History  Substance Use Topics  . Smoking status: Never Smoker   . Smokeless tobacco: None  . Alcohol Use: 0.0 oz/week    0 Standard drinks or equivalent per week   OB History    No data available     Review of Systems  Constitutional: Negative for fever, chills, diaphoresis, activity change, appetite change and fatigue.  HENT: Positive for congestion and sinus pressure. Negative for dental problem, drooling, ear discharge, ear  pain, facial swelling, hearing loss, mouth sores, nosebleeds, postnasal drip, rhinorrhea, sneezing, sore throat, tinnitus, trouble swallowing and voice change.   Eyes: Negative for photophobia, pain, discharge, redness, itching and visual disturbance.  Respiratory: Positive for  cough. Negative for choking, chest tightness, shortness of breath, wheezing and stridor.   Cardiovascular: Negative for chest pain and leg swelling.  Gastrointestinal: Positive for abdominal pain, diarrhea, abdominal distention and flatus. Negative for nausea, vomiting, constipation, blood in stool, melena, hematochezia, anal bleeding, rectal pain, anorexia and hematemesis.  Endocrine: Negative for cold intolerance and heat intolerance.  Genitourinary: Negative for dysuria, urgency, frequency, hematuria, flank pain, decreased urine volume, vaginal bleeding, vaginal discharge, enuresis, difficulty urinating, genital sores, vaginal pain, menstrual problem, pelvic pain and dyspareunia.  Musculoskeletal: Negative for myalgias, back pain, joint swelling, arthralgias, gait problem, neck pain and neck stiffness.  Skin: Negative for color change, pallor, rash and wound.  Allergic/Immunologic: Positive for environmental allergies. Negative for food allergies.  Neurological: Negative for dizziness, tremors, seizures, syncope, facial asymmetry, speech difficulty, weakness, light-headedness, numbness and headaches.  Hematological: Negative for adenopathy. Does not bruise/bleed easily.  Psychiatric/Behavioral: Negative for behavioral problems, confusion, sleep disturbance and agitation.    Allergies  Penicillins  Home Medications   Prior to Admission medications   Medication Sig Start Date End Date Taking? Authorizing Provider  Biotin 10 MG TABS Take 1 tablet by mouth daily.   Yes Historical Provider, MD  vitamin B-12 (CYANOCOBALAMIN) 1000 MCG tablet Take 1,000 mcg by mouth daily.   Yes Historical Provider, MD  acetaminophen (TYLENOL) 500 MG tablet Take 1 tablet (500 mg total) by mouth every 6 (six) hours as needed for mild pain or moderate pain. 10/02/15 10/08/15  Olen Cordial, NP  CAMILA 0.35 MG tablet  10/29/14   Historical Provider, MD  fluconazole (DIFLUCAN) 200 MG tablet Take 1 tablet (200 mg  total) by mouth daily. 10/02/15 10/16/15  Olen Cordial, NP  fluticasone (FLONASE) 50 MCG/ACT nasal spray Place 1 spray into both nostrils 2 (two) times daily. 10/02/15   Olen Cordial, NP  sodium chloride (OCEAN) 0.65 % SOLN nasal spray Place 2 sprays into both nostrils every 2 (two) hours while awake. 10/02/15   Olen Cordial, NP   Meds Ordered and Administered this Visit  Medications - No data to display  BP 137/82 mmHg  Pulse 88  Temp(Src) 97.9 F (36.6 C) (Tympanic)  Resp 16  Ht 5\' 4"  (1.626 m)  Wt 170 lb (77.111 kg)  BMI 29.17 kg/m2  SpO2 98%  LMP 09/11/2015 (Approximate) No data found.   Physical Exam  Constitutional: She is oriented to person, place, and time. Vital signs are normal. She appears well-developed and well-nourished. She is active and cooperative.  Non-toxic appearance. She does not have a sickly appearance. She does not appear ill. No distress.  HENT:  Head: Normocephalic and atraumatic.  Right Ear: Hearing, external ear and ear canal normal. A middle ear effusion is present.  Left Ear: Hearing, external ear and ear canal normal. A middle ear effusion is present.  Nose: Mucosal edema and rhinorrhea present. No nose lacerations, sinus tenderness, nasal deformity, septal deviation or nasal septal hematoma. No epistaxis.  No foreign bodies. Right sinus exhibits no maxillary sinus tenderness and no frontal sinus tenderness. Left sinus exhibits no maxillary sinus tenderness and no frontal sinus tenderness.  Mouth/Throat: Uvula is midline, oropharynx is clear and moist and mucous membranes are normal. Mucous membranes are not pale, not dry and not cyanotic.  She does not have dentures. No oral lesions. No trismus in the jaw. Normal dentition. No dental abscesses, uvula swelling, lacerations or dental caries. No oropharyngeal exudate or tonsillar abscesses.  Bilateral TMs with air fluid level; bilateral nasal turbiantes with edema/erythema clear discharge; slight  erythema oropharynx  Eyes: Conjunctivae, EOM and lids are normal. Pupils are equal, round, and reactive to light. Right eye exhibits no chemosis, no discharge, no exudate and no hordeolum. No foreign body present in the right eye. Left eye exhibits no chemosis, no discharge, no exudate and no hordeolum. No foreign body present in the left eye. Right conjunctiva is not injected. Right conjunctiva has no hemorrhage. Left conjunctiva is not injected. Left conjunctiva has no hemorrhage. No scleral icterus. Right eye exhibits normal extraocular motion and no nystagmus. Left eye exhibits normal extraocular motion and no nystagmus. Right pupil is round and reactive. Left pupil is round and reactive. Pupils are equal.  Neck: Trachea normal and normal range of motion. Neck supple. No tracheal tenderness, no spinous process tenderness and no muscular tenderness present. No rigidity. No tracheal deviation, no edema, no erythema and normal range of motion present. No thyroid mass and no thyromegaly present.  Cardiovascular: Normal rate, regular rhythm, S1 normal, S2 normal, normal heart sounds and intact distal pulses.  PMI is not displaced.  Exam reveals no gallop and no friction rub.   No murmur heard. Pulmonary/Chest: Effort normal and breath sounds normal. No accessory muscle usage or stridor. No respiratory distress. She has no decreased breath sounds. She has no wheezes. She has no rhonchi. She has no rales. She exhibits no tenderness.  Abdominal: Soft. She exhibits no shifting dullness, no distension, no pulsatile liver, no fluid wave, no abdominal bruit, no ascites, no pulsatile midline mass and no mass. Bowel sounds are decreased. There is no hepatosplenomegaly. There is tenderness in the left lower quadrant. There is no rigidity, no rebound, no guarding, no CVA tenderness, no tenderness at McBurney's point and negative Murphy's sign. No hernia. Hernia confirmed negative in the ventral area, confirmed negative in  the right inguinal area and confirmed negative in the left inguinal area.    Musculoskeletal: Normal range of motion. She exhibits no edema or tenderness.       Right shoulder: Normal.       Left shoulder: Normal.       Right hip: Normal.       Left hip: Normal.       Right knee: Normal.       Left knee: Normal.       Cervical back: Normal.       Right hand: Normal.       Left hand: Normal.  Lymphadenopathy:       Head (right side): No submental, no submandibular, no tonsillar, no preauricular, no posterior auricular and no occipital adenopathy present.       Head (left side): No submental, no submandibular, no tonsillar, no preauricular, no posterior auricular and no occipital adenopathy present.    She has no cervical adenopathy.       Right cervical: No superficial cervical, no deep cervical and no posterior cervical adenopathy present.      Left cervical: No superficial cervical, no deep cervical and no posterior cervical adenopathy present.  Neurological: She is alert and oriented to person, place, and time. She has normal strength. She is not disoriented. She displays no atrophy and no tremor. No cranial nerve deficit or sensory deficit. She exhibits normal muscle  tone. She displays no seizure activity. Coordination and gait normal. GCS eye subscore is 4. GCS verbal subscore is 5. GCS motor subscore is 6.  Skin: Skin is warm, dry and intact. No abrasion, no bruising, no burn, no ecchymosis, no laceration, no lesion, no petechiae and no rash noted. She is not diaphoretic. No cyanosis or erythema. No pallor. Nails show no clubbing.  Psychiatric: She has a normal mood and affect. Her speech is normal and behavior is normal. Judgment and thought content normal. Cognition and memory are normal.  Nursing note and vitals reviewed.   ED Course  Procedures (including critical care time)  Labs Review Labs Reviewed  URINALYSIS COMPLETEWITH MICROSCOPIC ([facility] ONLY) - Abnormal; Notable for the  following:    Bilirubin Urine 1+ (*)    Hgb urine dipstick TRACE (*)    Protein, ur PRESENT (*)    Bacteria, UA FEW (*)    Squamous Epithelial / LPF 6-30 (*)    All other components within normal limits  URINE CULTURE    Imaging Review No results found.  0900 Patient given copy of urinalysis results and discussed abnormal for protein, blood, bacteria, bilirubin and yeast.  Patient verbalized understanding of information/instructions, agreed with plan of care and had no further questions at this time.  MDM   1. Acute rhinosinusitis   2. Cystitis   3. Otitis media with effusion, bilateral    Medications as directed.  Candidiasis diflucan 200mg  po daily x 2 weeks per up to date.  Discussed avoid alcohol intake and use back up birth control.  Will call with urine culture results once available typically 48 hours.  Patient is also to push fluids.  Hydrate, avoid dehydration.  Avoid holding urine void on frequent basis every 4 to 6 hours.  If unable to void every 8 hours and/or worsening inguinal pain follow up for re-evaluation with PCM, urgent care or ER.   Call or return to clinic as needed if these symptoms worsen or fail to improve as anticipated. Discussed with patient kidney stones, ovarian cysts, diverticulitis can have similar symptoms and discussed evaluation/treatment options. Exitcare handout on cystitis, candidiasis given to patient Patient verbalized agreement and understanding of treatment plan and had no further questions at this time. P2:  Hydrate and cranberry juice  I have recommended clear fluids and bland diet.  Avoid dairy/spicy, fried and large portions of meat while having nausea.  If vomiting hold po intake x 1 hour.  Then sips clear fluids like broths, ginger ale, power ade, gatorade, pedialyte may advance to soft/bland if no vomiting x 24 hours and appetite returned otherwise hydration main focus.     Return to the clinic if symptoms persist or worsen; I have alerted the  patient to call if high fever, dehydration, marked weakness, fainting, increased abdominal pain, blood in stool or vomit (red or black).   Exitcare handout on abdomen pain given to patient. Patient verbalized agreement and understanding of treatment plan and had no further questions at this time.  Supportive treatment.   No evidence of invasive bacterial infection, non toxic and well hydrated.  This is most likely self limiting viral infection.  I do not see where any further testing or imaging is necessary at this time.   I will suggest supportive care, rest, good hygiene and encourage the patient to take adequate fluids.  The patient is to return to clinic or EMERGENCY ROOM if symptoms worsen or change significantly e.g. ear pain, fever, purulent discharge  from ears or bleeding.  Exitcare handout on otitis media with effusion given to patient.  Patient verbalized agreement and understanding of treatment plan.    Suspect Viral illness: no evidence of invasive bacterial infection, non toxic and well hydrated.  This is most likely self limiting viral infection.  I do not see where any further testing or imaging is necessary at this time.   I will suggest supportive care, rest, good hygiene and encourage the patient to take adequate fluids.  Does not require work excuse. Rx flonase 1 spray each nostril BID prn, nasal saline 1-2 sprays each nostril prn q2h, afrin 1 spray each nostril BID 72 hours maximum use discussed rebound swelling possible if used longer.  Discussed honey with lemon and salt water gargles for comfort also.  The patient is to return to clinic or EMERGENCY ROOM if symptoms worsen or change significantly e.g. fever, lethargy, SOB, wheezing.  Exitcare handout on viral illness given to patient.  Patient verbalized agreement and understanding of treatment plan.    No evidence of systemic bacterial infection, non toxic and well hydrated.  I do not see where any further testing or imaging is  necessary at this time.   I will suggest supportive care, rest, good hygiene and encourage the patient to take adequate fluids.  The patient is to return to clinic or EMERGENCY ROOM if symptoms worsen or change significantly.  Exitcare handout on sinusitis given to patient.  Patient verbalized agreement and understanding of treatment plan and had no further questions at this time.   P2:  Hand washing and cover cough   Olen Cordial, NP 10/02/15 0912  Olen Cordial, NP 10/02/15 [id number]  04 Oct 2015 at [place] patient notified urine culture negative/normal.  Patient reported feeling a little better still having some pain same area but not as bad taking diflucan as directed voiding normally no BM since Saturday.  Occasional coffee but not this weekend will drink cup to see if BM results today.  Discussed to keep up gentle activity to help facilitate BM also.  Denied fever, chills, nausea, vomiting, diarrhea, rash, bloating.  Patient to follow up if worsening pain, no resolution with completion of 2 week course diflucan, fever greater than 100.5.  Patient verbalized understanding of information/instructions, agreed with plan of care and had no further questions at this time.  Olen Cordial, NP 10/04/15 [phone]

## 2015-10-02 NOTE — Discharge Instructions (Signed)
Sinusitis, Adult °Sinusitis is redness, soreness, and inflammation of the paranasal sinuses. Paranasal sinuses are air pockets within the bones of your face. They are located beneath your eyes, in the middle of your forehead, and above your eyes. In healthy paranasal sinuses, mucus is able to drain out, and air is able to circulate through them by way of your nose. However, when your paranasal sinuses are inflamed, mucus and air can become trapped. This can allow bacteria and other germs to grow and cause infection. °Sinusitis can develop quickly and last only a short time (acute) or continue over a long period (chronic). Sinusitis that lasts for more than 12 weeks is considered chronic. °CAUSES °Causes of sinusitis include: °· Allergies. °· Structural abnormalities, such as displacement of the cartilage that separates your nostrils (deviated septum), which can decrease the air flow through your nose and sinuses and affect sinus drainage. °· Functional abnormalities, such as when the small hairs (cilia) that line your sinuses and help remove mucus do not work properly or are not present. °SIGNS AND SYMPTOMS °Symptoms of acute and chronic sinusitis are the same. The primary symptoms are pain and pressure around the affected sinuses. Other symptoms include: °· Upper toothache. °· Earache. °· Headache. °· Bad breath. °· Decreased sense of smell and taste. °· A cough, which worsens when you are lying flat. °· Fatigue. °· Fever. °· Thick drainage from your nose, which often is green and may contain pus (purulent). °· Swelling and warmth over the affected sinuses. °DIAGNOSIS °Your health care provider will perform a physical exam. During your exam, your health care provider may perform any of the following to help determine if you have acute sinusitis or chronic sinusitis: °· Look in your nose for signs of abnormal growths in your nostrils (nasal polyps). °· Tap over the affected sinus to check for signs of  infection. °· View the inside of your sinuses using an imaging device that has a light attached (endoscope). °If your health care provider suspects that you have chronic sinusitis, one or more of the following tests may be recommended: °· Allergy tests. °· Nasal culture. A sample of mucus is taken from your nose, sent to a lab, and screened for bacteria. °· Nasal cytology. A sample of mucus is taken from your nose and examined by your health care provider to determine if your sinusitis is related to an allergy. °TREATMENT °Most cases of acute sinusitis are related to a viral infection and will resolve on their own within 10 days. Sometimes, medicines are prescribed to help relieve symptoms of both acute and chronic sinusitis. These may include pain medicines, decongestants, nasal steroid sprays, or saline sprays. °However, for sinusitis related to a bacterial infection, your health care provider will prescribe antibiotic medicines. These are medicines that will help kill the bacteria causing the infection. °Rarely, sinusitis is caused by a fungal infection. In these cases, your health care provider will prescribe antifungal medicine. °For some cases of chronic sinusitis, surgery is needed. Generally, these are cases in which sinusitis recurs more than 3 times per year, despite other treatments. °HOME CARE INSTRUCTIONS °· Drink plenty of water. Water helps thin the mucus so your sinuses can drain more easily. °· Use a humidifier. °· Inhale steam 3-4 times a day (for example, sit in the bathroom with the shower running). °· Apply a warm, moist washcloth to your face 3-4 times a day, or as directed by your health care provider. °· Use saline nasal sprays to help   moisten and clean your sinuses.  Take medicines only as directed by your health care provider.  If you were prescribed either an antibiotic or antifungal medicine, finish it all even if you start to feel better. SEEK IMMEDIATE MEDICAL CARE IF:  You have  increasing pain or severe headaches.  You have nausea, vomiting, or drowsiness.  You have swelling around your face.  You have vision problems.  You have a stiff neck.  You have difficulty breathing.   This information is not intended to replace advice given to you by your health care provider. Make sure you discuss any questions you have with your health care provider.   Document Released: 10/09/2005 Document Revised: 10/30/2014 Document Reviewed: 10/24/2011 Elsevier Interactive Patient Education 2016 [employer]. Otitis Media With Effusion Otitis media with effusion is the presence of fluid in the middle ear. This is a common problem in children, which often follows ear infections. It may be present for weeks or longer after the infection. Unlike an acute ear infection, otitis media with effusion refers only to fluid behind the ear drum and not infection. Children with repeated ear and sinus infections and allergy problems are the most likely to get otitis media with effusion. CAUSES  The most frequent cause of the fluid buildup is dysfunction of the eustachian tubes. These are the tubes that drain fluid in the ears to the back of the nose (nasopharynx). SYMPTOMS   The main symptom of this condition is hearing loss. As a result, you or your child may:  Listen to the TV at a loud volume.  Not respond to questions.  Ask "what" often when spoken to.  Mistake or confuse one sound or word for another.  There may be a sensation of fullness or pressure but usually not pain. DIAGNOSIS   Your health care provider will diagnose this condition by examining you or your child's ears.  Your health care provider may test the pressure in you or your child's ear with a tympanometer.  A hearing test may be conducted if the problem persists. TREATMENT   Treatment depends on the duration and the effects of the effusion.  Antibiotics, decongestants, nose drops, and cortisone-type drugs  (tablets or nasal spray) may not be helpful.  Children with persistent ear effusions may have delayed language or behavioral problems. Children at risk for developmental delays in hearing, learning, and speech may require referral to a specialist earlier than children not at risk.  You or your child's health care provider may suggest a referral to an ear, nose, and throat surgeon for treatment. The following may help restore normal hearing:  Drainage of fluid.  Placement of ear tubes (tympanostomy tubes).  Removal of adenoids (adenoidectomy). HOME CARE INSTRUCTIONS   Avoid secondhand smoke.  Infants who are breastfed are less likely to have this condition.  Avoid feeding infants while they are lying flat.  Avoid known environmental allergens.  Avoid people who are sick. SEEK MEDICAL CARE IF:   Hearing is not better in 3 months.  Hearing is worse.  Ear pain.  Drainage from the ear.  Dizziness. MAKE SURE YOU:   Understand these instructions.  Will watch your condition.  Will get help right away if you are not doing well or get worse.   This information is not intended to replace advice given to you by your health care provider. Make sure you discuss any questions you have with your health care provider.   Document Released: 11/16/2004 Document Revised: 10/30/2014  Document Reviewed: 05/06/2013 Elsevier Interactive Patient Education 2016 Elsevier Inc. Monilial Vaginitis Vaginitis in a soreness, swelling and redness (inflammation) of the vagina and vulva. Monilial vaginitis is not a sexually transmitted infection. CAUSES  Yeast vaginitis is caused by yeast (candida) that is normally found in your vagina. With a yeast infection, the candida has overgrown in number to a point that upsets the chemical balance. SYMPTOMS   White, thick vaginal discharge.  Swelling, itching, redness and irritation of the vagina and possibly the lips of the vagina (vulva).  Burning or  painful urination.  Painful intercourse. DIAGNOSIS  Things that may contribute to monilial vaginitis are:  Postmenopausal and virginal states.  Pregnancy.  Infections.  Being tired, sick or stressed, especially if you had monilial vaginitis in the past.  Diabetes. Good control will help lower the chance.  Birth control pills.  Tight fitting garments.  Using bubble bath, feminine sprays, douches or deodorant tampons.  Taking certain medications that kill germs (antibiotics).  Sporadic recurrence can occur if you become ill. TREATMENT  Your caregiver will give you medication.  There are several kinds of anti monilial vaginal creams and suppositories specific for monilial vaginitis. For recurrent yeast infections, use a suppository or cream in the vagina 2 times a week, or as directed.  Anti-monilial or steroid cream for the itching or irritation of the vulva may also be used. Get your caregiver's permission.  Painting the vagina with methylene blue solution may help if the monilial cream does not work.  Eating yogurt may help prevent monilial vaginitis. HOME CARE INSTRUCTIONS   Finish all medication as prescribed.  Do not have sex until treatment is completed or after your caregiver tells you it is okay.  Take warm sitz baths.  Do not douche.  Do not use tampons, especially scented ones.  Wear cotton underwear.  Avoid tight pants and panty hose.  Tell your sexual partner that you have a yeast infection. They should go to their caregiver if they have symptoms such as mild rash or itching.  Your sexual partner should be treated as well if your infection is difficult to eliminate.  Practice safer sex. Use condoms.  Some vaginal medications cause latex condoms to fail. Vaginal medications that harm condoms are:  Cleocin cream.  Butoconazole (Femstat).  Terconazole (Terazol) vaginal suppository.  Miconazole (Monistat) (may be purchased over the  counter). SEEK MEDICAL CARE IF:   You have a temperature by mouth above 102 F (38.9 C).  The infection is getting worse after 2 days of treatment.  The infection is not getting better after 3 days of treatment.  You develop blisters in or around your vagina.  You develop vaginal bleeding, and it is not your menstrual period.  You have pain when you urinate.  You develop intestinal problems.  You have pain with sexual intercourse.   This information is not intended to replace advice given to you by your health care provider. Make sure you discuss any questions you have with your health care provider.   Document Released: 07/19/2005 Document Revised: 01/01/2012 Document Reviewed: 04/12/2015 Elsevier Interactive Patient Education 2016 Elsevier Inc. Abdominal Pain, Adult Many things can cause abdominal pain. Usually, abdominal pain is not caused by a disease and will improve without treatment. It can often be observed and treated at home. Your health care provider will do a physical exam and possibly order blood tests and X-rays to help determine the seriousness of your pain. However, in many cases, more time must  pass before a clear cause of the pain can be found. Before that point, your health care provider may not know if you need more testing or further treatment. HOME CARE INSTRUCTIONS Monitor your abdominal pain for any changes. The following actions may help to alleviate any discomfort you are experiencing:  Only take over-the-counter or prescription medicines as directed by your health care provider.  Do not take laxatives unless directed to do so by your health care provider.  Try a clear liquid diet (broth, tea, or water) as directed by your health care provider. Slowly move to a bland diet as tolerated. SEEK MEDICAL CARE IF:  You have unexplained abdominal pain.  You have abdominal pain associated with nausea or diarrhea.  You have pain when you urinate or have a  bowel movement.  You experience abdominal pain that wakes you in the night.  You have abdominal pain that is worsened or improved by eating food.  You have abdominal pain that is worsened with eating fatty foods.  You have a fever. SEEK IMMEDIATE MEDICAL CARE IF:  Your pain does not go away within 2 hours.  You keep throwing up (vomiting).  Your pain is felt only in portions of the abdomen, such as the right side or the left lower portion of the abdomen.  You pass bloody or black tarry stools. MAKE SURE YOU:  Understand these instructions.  Will watch your condition.  Will get help right away if you are not doing well or get worse.   This information is not intended to replace advice given to you by your health care provider. Make sure you discuss any questions you have with your health care provider.   Document Released: 07/19/2005 Document Revised: 06/30/2015 Document Reviewed: 06/18/2013 Elsevier Interactive Patient Education 2016 Elsevier Inc. Urinary Tract Infection Urinary tract infections (UTIs) can develop anywhere along your urinary tract. Your urinary tract is your body's drainage system for removing wastes and extra water. Your urinary tract includes two kidneys, two ureters, a bladder, and a urethra. Your kidneys are a pair of bean-shaped organs. Each kidney is about the size of your fist. They are located below your ribs, one on each side of your spine. CAUSES Infections are caused by microbes, which are microscopic organisms, including fungi, viruses, and bacteria. These organisms are so small that they can only be seen through a microscope. Bacteria are the microbes that most commonly cause UTIs. SYMPTOMS  Symptoms of UTIs may vary by age and gender of the patient and by the location of the infection. Symptoms in young women typically include a frequent and intense urge to urinate and a painful, burning feeling in the bladder or urethra during urination. Older women  and men are more likely to be tired, shaky, and weak and have muscle aches and abdominal pain. A fever may mean the infection is in your kidneys. Other symptoms of a kidney infection include pain in your back or sides below the ribs, nausea, and vomiting. DIAGNOSIS To diagnose a UTI, your caregiver will ask you about your symptoms. Your caregiver will also ask you to provide a urine sample. The urine sample will be tested for bacteria and white blood cells. White blood cells are made by your body to help fight infection. TREATMENT  Typically, UTIs can be treated with medication. Because most UTIs are caused by a bacterial infection, they usually can be treated with the use of antibiotics. The choice of antibiotic and length of treatment depend on your  symptoms and the type of bacteria causing your infection. HOME CARE INSTRUCTIONS  If you were prescribed antibiotics, take them exactly as your caregiver instructs you. Finish the medication even if you feel better after you have only taken some of the medication.  Drink enough water and fluids to keep your urine clear or pale yellow.  Avoid caffeine, tea, and carbonated beverages. They tend to irritate your bladder.  Empty your bladder often. Avoid holding urine for long periods of time.  Empty your bladder before and after sexual intercourse.  After a bowel movement, women should cleanse from front to back. Use each tissue only once. SEEK MEDICAL CARE IF:   You have back pain.  You develop a fever.  Your symptoms do not begin to resolve within 3 days. SEEK IMMEDIATE MEDICAL CARE IF:   You have severe back pain or lower abdominal pain.  You develop chills.  You have nausea or vomiting.  You have continued burning or discomfort with urination. MAKE SURE YOU:   Understand these instructions.  Will watch your condition.  Will get help right away if you are not doing well or get worse.   This information is not intended to replace  advice given to you by your health care provider. Make sure you discuss any questions you have with your health care provider.   Document Released: 07/19/2005 Document Revised: 06/30/2015 Document Reviewed: 11/17/2011 Elsevier Interactive Patient Education [employer].

## 2015-10-04 LAB — URINE CULTURE
CULTURE: NO GROWTH
SPECIAL REQUESTS: NORMAL

## 2016-02-09 DIAGNOSIS — Z124 Encounter for screening for malignant neoplasm of cervix: Secondary | ICD-10-CM | POA: Diagnosis not present

## 2016-02-09 DIAGNOSIS — Z9289 Personal history of other medical treatment: Secondary | ICD-10-CM

## 2016-02-09 DIAGNOSIS — Z9222 Personal history of monoclonal drug therapy: Secondary | ICD-10-CM | POA: Diagnosis not present

## 2016-02-09 DIAGNOSIS — Z1239 Encounter for other screening for malignant neoplasm of breast: Secondary | ICD-10-CM | POA: Diagnosis not present

## 2016-02-09 DIAGNOSIS — Z1151 Encounter for screening for human papillomavirus (HPV): Secondary | ICD-10-CM | POA: Diagnosis not present

## 2016-02-09 DIAGNOSIS — Z01419 Encounter for gynecological examination (general) (routine) without abnormal findings: Secondary | ICD-10-CM | POA: Diagnosis not present

## 2016-02-09 HISTORY — DX: Personal history of other medical treatment: Z92.89

## 2016-02-09 LAB — HM PAP SMEAR: HM PAP: NEGATIVE

## 2016-02-18 DIAGNOSIS — R197 Diarrhea, unspecified: Secondary | ICD-10-CM | POA: Diagnosis not present

## 2016-02-18 DIAGNOSIS — R112 Nausea with vomiting, unspecified: Secondary | ICD-10-CM | POA: Diagnosis not present

## 2016-04-13 DIAGNOSIS — H10021 Other mucopurulent conjunctivitis, right eye: Secondary | ICD-10-CM | POA: Diagnosis not present

## 2016-05-02 DIAGNOSIS — M5387 Other specified dorsopathies, lumbosacral region: Secondary | ICD-10-CM | POA: Diagnosis not present

## 2016-05-02 DIAGNOSIS — M9901 Segmental and somatic dysfunction of cervical region: Secondary | ICD-10-CM | POA: Diagnosis not present

## 2016-05-02 DIAGNOSIS — M531 Cervicobrachial syndrome: Secondary | ICD-10-CM | POA: Diagnosis not present

## 2016-05-30 DIAGNOSIS — M531 Cervicobrachial syndrome: Secondary | ICD-10-CM | POA: Diagnosis not present

## 2016-05-30 DIAGNOSIS — M9903 Segmental and somatic dysfunction of lumbar region: Secondary | ICD-10-CM | POA: Diagnosis not present

## 2016-05-30 DIAGNOSIS — M436 Torticollis: Secondary | ICD-10-CM | POA: Diagnosis not present

## 2016-08-08 DIAGNOSIS — S6991XA Unspecified injury of right wrist, hand and finger(s), initial encounter: Secondary | ICD-10-CM | POA: Diagnosis not present

## 2016-08-08 DIAGNOSIS — M79644 Pain in right finger(s): Secondary | ICD-10-CM | POA: Diagnosis not present

## 2016-11-07 DIAGNOSIS — M9903 Segmental and somatic dysfunction of lumbar region: Secondary | ICD-10-CM | POA: Diagnosis not present

## 2016-11-07 DIAGNOSIS — M531 Cervicobrachial syndrome: Secondary | ICD-10-CM | POA: Diagnosis not present

## 2016-11-07 DIAGNOSIS — M436 Torticollis: Secondary | ICD-10-CM | POA: Diagnosis not present

## 2016-12-08 DIAGNOSIS — M9908 Segmental and somatic dysfunction of rib cage: Secondary | ICD-10-CM | POA: Diagnosis not present

## 2016-12-08 DIAGNOSIS — S2341XA Sprain of ribs, initial encounter: Secondary | ICD-10-CM | POA: Diagnosis not present

## 2017-03-13 ENCOUNTER — Telehealth: Payer: Self-pay | Admitting: Obstetrics and Gynecology

## 2017-03-13 ENCOUNTER — Other Ambulatory Visit: Payer: Self-pay

## 2017-03-13 DIAGNOSIS — Z30011 Encounter for initial prescription of contraceptive pills: Secondary | ICD-10-CM

## 2017-03-13 MED ORDER — CAMILA 0.35 MG PO TABS: 1.0000 | ORAL_TABLET | Freq: Every day | ORAL | 1 refills | Status: DC

## 2017-03-13 NOTE — Telephone Encounter (Signed)
Pt aware.

## 2017-03-13 NOTE — Telephone Encounter (Signed)
Pt is schedule for June 28th for annual and needing an refill on her birthcontrol. [facility] [place].

## 2017-03-29 DIAGNOSIS — H18821 Corneal disorder due to contact lens, right eye: Secondary | ICD-10-CM | POA: Diagnosis not present

## 2017-04-02 DIAGNOSIS — H16101 Unspecified superficial keratitis, right eye: Secondary | ICD-10-CM | POA: Diagnosis not present

## 2017-04-08 ENCOUNTER — Encounter: Payer: Self-pay | Admitting: Gynecology

## 2017-04-08 ENCOUNTER — Ambulatory Visit
Admission: EM | Admit: 2017-04-08 | Discharge: 2017-04-08 | Disposition: A | Discharge status: Home / Self Care | Payer: BLUE CROSS/BLUE SHIELD | Attending: Internal Medicine | Admitting: Internal Medicine

## 2017-04-08 DIAGNOSIS — M25462 Effusion, left knee: Secondary | ICD-10-CM

## 2017-04-08 DIAGNOSIS — S86812A Strain of other muscle(s) and tendon(s) at lower leg level, left leg, initial encounter: Secondary | ICD-10-CM

## 2017-04-08 NOTE — ED Triage Notes (Signed)
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  Left knee "feels full" and aches x 2 days.

## 2017-04-08 NOTE — ED Provider Notes (Signed)
[facility]    CSN: [id number] Arrival date & time: 04/08/17  4098     History   Chief Complaint Chief Complaint  Patient presents with  . Knee Pain    HPI Krista Blake is a 51 y.o. female.   Pt complains of grinding in her left knee since starting a boot camp exercise class. No pain but admits to pressure/discomfort when flexes in a full squat. She does not remember specifically injuring her knee. She is able to run without pain.       History reviewed. No pertinent past medical history.  Patient Active Problem List   Diagnosis Date Noted  . BP (high blood pressure) 11/04/2014    History reviewed. No pertinent surgical history.  OB History    No data available       Home Medications    Prior to Admission medications   Medication Sig Start Date End Date Taking? Authorizing Provider  Biotin 10 MG TABS Take 1 tablet by mouth daily.   Yes [provider]  CAMILA 0.35 MG tablet Take 1 tablet (0.35 mg total) by mouth daily. 03/13/17  Yes Gae Dry, MD  fluticasone (FLONASE) 50 MCG/ACT nasal spray Place 1 spray into both nostrils 2 (two) times daily. 10/02/15  Yes Betancourt, Aura Fey, NP  sodium chloride (OCEAN) 0.65 % SOLN nasal spray Place 2 sprays into both nostrils every 2 (two) hours while awake. 10/02/15  Yes Betancourt, Aura Fey, NP  vitamin B-12 (CYANOCOBALAMIN) 1000 MCG tablet Take 1,000 mcg by mouth daily.   Yes [provider]    Family History Family History  Problem Relation Age of Onset  . Hypertension Father     Social History Social History  Substance Use Topics  . Smoking status: Never Smoker  . Smokeless tobacco: Never Used  . Alcohol use 0.0 oz/week     Comment: social     Allergies   Penicillins   Review of Systems Review of Systems  Constitutional: Negative for chills and fever.  HENT: Negative for sore throat and tinnitus.   Eyes: Negative for redness.  Respiratory: Negative for cough  and shortness of breath.   Cardiovascular: Negative for chest pain and palpitations.  Gastrointestinal: Negative for abdominal pain, diarrhea, nausea and vomiting.  Genitourinary: Negative for dysuria, frequency and urgency.  Musculoskeletal: Positive for joint swelling. Negative for myalgias.  Skin: Negative for rash.       No lesions  Neurological: Negative for weakness.  Hematological: Does not bruise/bleed easily.  Psychiatric/Behavioral: Negative for suicidal ideas.     Physical Exam Triage Vital Signs ED Triage Vitals  Enc Vitals Group     BP 04/08/17 0835 129/78     Pulse Rate 04/08/17 0835 74     Resp 04/08/17 0835 16     Temp 04/08/17 0835 98.6 F (37 C)     Temp Source 04/08/17 0835 Oral     SpO2 04/08/17 0835 99 %     Weight 04/08/17 0835 177 lb (80.3 kg)     Height 04/08/17 0835 5\' 4"  (1.626 m)     Head Circumference --      Peak Flow --      Pain Score 04/08/17 0836 8     Pain Loc --      Pain Edu? --      Excl. in [place]? --    No data found.   Updated Vital Signs BP 129/78 (BP Location: Left Arm)   Pulse  74   Temp 98.6 F (37 C) (Oral)   Resp 16   Ht 5\' 4"  (1.626 m)   Wt 177 lb (80.3 kg)   LMP 03/18/2017 (Exact Date)   SpO2 99%   BMI 30.38 kg/m   Visual Acuity Right Eye Distance:   Left Eye Distance:   Bilateral Distance:    Right Eye Near:   Left Eye Near:    Bilateral Near:     Physical Exam  Constitutional: She is oriented to person, place, and time. She appears well-developed and well-nourished. No distress.  HENT:  Head: Normocephalic and atraumatic.  Mouth/Throat: Oropharynx is clear and moist.  Eyes: Conjunctivae and EOM are normal. Pupils are equal, round, and reactive to light. No scleral icterus.  Neck: Normal range of motion. Neck supple. No JVD present. No tracheal deviation present. No thyromegaly present.  Cardiovascular: Normal rate, regular rhythm and normal heart sounds.  Exam reveals no gallop and no friction rub.   No  murmur heard. Pulmonary/Chest: Effort normal and breath sounds normal.  Abdominal: Soft. Bowel sounds are normal. She exhibits no distension. There is no tenderness.  Musculoskeletal: Normal range of motion. She exhibits no edema.       Left knee: She exhibits effusion (small). She exhibits normal range of motion, normal alignment, no LCL laxity and no MCL laxity. Tenderness found.  Anterior and posterior drawer negative; no laxity or pain on internal or external rotation  Lymphadenopathy:    She has no cervical adenopathy.  Neurological: She is alert and oriented to person, place, and time. No cranial nerve deficit.  Skin: Skin is warm and dry.  Psychiatric: She has a normal mood and affect. Her behavior is normal. Judgment and thought content normal.  Nursing note and vitals reviewed.    UC Treatments / Results  Labs (all labs ordered are listed, but only abnormal results are displayed) Labs Reviewed - No data to display  EKG  EKG Interpretation None       Radiology No results found.  Procedures Procedures (including critical care time)  Medications Ordered in UC Medications - No data to display   Initial Impression / Assessment and Plan / UC Course  I have reviewed the triage vital signs and the nursing notes.  Pertinent labs & imaging results that were available during my care of the patient were reviewed by me and considered in my medical decision making (see chart for details).     Likely mild patellar strain with associated effusion. RICE. Minimal flexion. Outlined limitations to exercise. NSAIDs to decrease swelling/inflammation.   Final Clinical Impressions(s) / UC Diagnoses   Final diagnoses:  Effusion of left knee  Patellar tendon strain, left, initial encounter    New Prescriptions New Prescriptions   No medications on file     Harrie Foreman, MD 04/08/17 [phone]

## 2017-04-19 ENCOUNTER — Ambulatory Visit (INDEPENDENT_AMBULATORY_CARE_PROVIDER_SITE_OTHER): Payer: BLUE CROSS/BLUE SHIELD | Admitting: Obstetrics and Gynecology

## 2017-04-19 ENCOUNTER — Encounter: Payer: Self-pay | Admitting: Obstetrics and Gynecology

## 2017-04-19 VITALS — BP 128/80 | HR 76 | Ht 64.0 in | Wt 179.0 lb

## 2017-04-19 DIAGNOSIS — Z1239 Encounter for other screening for malignant neoplasm of breast: Secondary | ICD-10-CM

## 2017-04-19 DIAGNOSIS — Z01419 Encounter for gynecological examination (general) (routine) without abnormal findings: Secondary | ICD-10-CM | POA: Diagnosis not present

## 2017-04-19 DIAGNOSIS — Z3041 Encounter for surveillance of contraceptive pills: Secondary | ICD-10-CM | POA: Diagnosis not present

## 2017-04-19 DIAGNOSIS — Z1231 Encounter for screening mammogram for malignant neoplasm of breast: Secondary | ICD-10-CM

## 2017-04-19 MED ORDER — CAMILA 0.35 MG PO TABS: 1.0000 | ORAL_TABLET | Freq: Every day | ORAL | 12 refills | Status: DC

## 2017-04-19 NOTE — Progress Notes (Signed)
Chief Complaint  Patient presents with  . Gynecologic Exam     HPI:      [name] is a 51 y.o. G0P0000 who LMP was Patient's last menstrual period was 04/13/2017., presents today for her annual examination.  Her menses are regular every 28-30 days, lasting 4 days.  Dysmenorrhea none. She does not have intermenstrual bleeding.  Sex activity: not sexually active. She is on OCPs and wants to continue them.  Last Pap: February 09, 2016  Results were: no abnormalities /neg HPV DNA  Hx of STDs: none  Last mammogram: Mar 04, 2015  Results were: normal--routine follow-up in 12 months There is no FH of breast cancer. There is no FH of ovarian cancer. The patient does not do self-breast exams.  Tobacco use: The patient denies current or previous tobacco use. Alcohol use: none Exercise: moderately active  She does not get adequate calcium and Vitamin D in her diet. She had normal labs 2016.   Past Medical History:  Diagnosis Date  . Bulging lumbar disc   . History of mammogram 03/04/2015   BIRAD 1  . History of Papanicolaou smear of cervix 02/09/2016   NIL/NEG  . Hypertension   . Pap smear abnormality of cervix/human papillomavirus (HPV) positive 02/05/2015   NIL Pap with positive HPV aptima.  HPV 16,18.45 neg    History reviewed. No pertinent surgical history.  Family History  Problem Relation Age of Onset  . Hypertension Father   . Diabetes Paternal Uncle   . Diabetes Paternal Grandfather   . Ovarian cancer Neg Hx   . Breast cancer Neg Hx   . Colon cancer Neg Hx     Social History   Social History  . Marital status: Single    Spouse name: N/A  . Number of children: N/A  . Years of education: N/A   Occupational History  . Not on file.   Social History Main Topics  . Smoking status: Never Smoker  . Smokeless tobacco: Never Used  . Alcohol use 0.0 oz/week     Comment: social  . Drug use: No  . Sexual activity: No   Other Topics Concern  . Not  on file   Social History Narrative  . No narrative on file     Current Outpatient Prescriptions:  .  Biotin 10 MG TABS, Take 1 tablet by mouth daily., Disp: , Rfl:  .  CAMILA 0.35 MG tablet, Take 1 tablet (0.35 mg total) by mouth daily., Disp: 1 Package, Rfl: 12 .  fluticasone (FLONASE) 50 MCG/ACT nasal spray, Place 1 spray into both nostrils 2 (two) times daily., Disp: 16 g, Rfl: 0 .  ofloxacin (OCUFLOX) 0.3 % ophthalmic solution, USE 1 DROP IN THE AFFECTED EYE THREE TIMES A DAY, Disp: , Rfl: 0 .  sodium chloride (OCEAN) 0.65 % SOLN nasal spray, Place 2 sprays into both nostrils every 2 (two) hours while awake., Disp: , Rfl: 0 .  vitamin B-12 (CYANOCOBALAMIN) 1000 MCG tablet, Take 1,000 mcg by mouth daily., Disp: , Rfl:   ROS:  Review of Systems  Constitutional: Negative for fatigue, fever and unexpected weight change.  Respiratory: Negative for cough, shortness of breath and wheezing.   Cardiovascular: Negative for chest pain, palpitations and leg swelling.  Gastrointestinal: Negative for blood in stool, constipation, diarrhea, nausea and vomiting.  Endocrine: Negative for cold intolerance, heat intolerance and polyuria.  Genitourinary: Negative for dyspareunia, dysuria, flank pain, frequency, genital sores, hematuria, menstrual problem, pelvic pain,  urgency, vaginal bleeding, vaginal discharge and vaginal pain.  Musculoskeletal: Negative for back pain, joint swelling and myalgias.  Skin: Negative for rash.  Neurological: Negative for dizziness, syncope, light-headedness, numbness and headaches.  Hematological: Negative for adenopathy.  Psychiatric/Behavioral: Negative for agitation, confusion, sleep disturbance and suicidal ideas. The patient is not nervous/anxious.      Objective: BP 128/80   Pulse 76   Ht 5\' 4"  (1.626 m)   Wt 179 lb (81.2 kg)   LMP 04/13/2017   BMI 30.73 kg/m    Physical Exam  Constitutional: She is oriented to person, place, and time. She appears  well-developed and well-nourished.  Genitourinary: Vagina normal and uterus normal. There is no rash or tenderness on the right labia. There is no rash or tenderness on the left labia. No erythema or tenderness in the vagina. No vaginal discharge found. Right adnexum does not display mass and does not display tenderness. Left adnexum does not display mass and does not display tenderness. Cervix does not exhibit motion tenderness or polyp. Uterus is not enlarged or tender.  Neck: Normal range of motion. No thyromegaly present.  Cardiovascular: Normal rate, regular rhythm and normal heart sounds.   No murmur heard. Pulmonary/Chest: Effort normal and breath sounds normal. Right breast exhibits no mass, no nipple discharge, no skin change and no tenderness. Left breast exhibits no mass, no nipple discharge, no skin change and no tenderness.  Abdominal: Soft. There is no tenderness. There is no guarding.  Musculoskeletal: Normal range of motion.  Neurological: She is alert and oriented to person, place, and time. No cranial nerve deficit.  Psychiatric: She has a normal mood and affect. Her behavior is normal.  Vitals reviewed.   Assessment/Plan: Encounter for annual routine gynecological examination  Screening for breast cancer - Pt to sched mammo. - Plan: MM DIGITAL SCREENING BILATERAL  Encounter for surveillance of contraceptive pills - OCP RF. - Plan: CAMILA 0.35 MG tablet             GYN counsel mammography screening, adequate intake of calcium and vitamin D, diet and exercise     F/U  Return in about 1 year (around 04/19/2018).  Alicia B. Copland, PA-C 04/19/2017 8:39 AM

## 2017-05-08 DIAGNOSIS — M531 Cervicobrachial syndrome: Secondary | ICD-10-CM | POA: Diagnosis not present

## 2017-05-08 DIAGNOSIS — M5386 Other specified dorsopathies, lumbar region: Secondary | ICD-10-CM | POA: Diagnosis not present

## 2017-08-14 DIAGNOSIS — M5386 Other specified dorsopathies, lumbar region: Secondary | ICD-10-CM | POA: Diagnosis not present

## 2017-08-14 DIAGNOSIS — M531 Cervicobrachial syndrome: Secondary | ICD-10-CM | POA: Diagnosis not present

## 2017-08-14 DIAGNOSIS — M23301 Other meniscus derangements, unspecified lateral meniscus, left knee: Secondary | ICD-10-CM | POA: Diagnosis not present

## 2017-08-14 DIAGNOSIS — M9902 Segmental and somatic dysfunction of thoracic region: Secondary | ICD-10-CM | POA: Diagnosis not present

## 2017-09-04 DIAGNOSIS — M9902 Segmental and somatic dysfunction of thoracic region: Secondary | ICD-10-CM | POA: Diagnosis not present

## 2017-09-04 DIAGNOSIS — M531 Cervicobrachial syndrome: Secondary | ICD-10-CM | POA: Diagnosis not present

## 2017-09-04 DIAGNOSIS — M23304 Other meniscus derangements, unspecified medial meniscus, left knee: Secondary | ICD-10-CM | POA: Diagnosis not present

## 2017-09-04 DIAGNOSIS — M5386 Other specified dorsopathies, lumbar region: Secondary | ICD-10-CM | POA: Diagnosis not present

## 2017-09-25 DIAGNOSIS — M5387 Other specified dorsopathies, lumbosacral region: Secondary | ICD-10-CM | POA: Diagnosis not present

## 2017-09-25 DIAGNOSIS — M23304 Other meniscus derangements, unspecified medial meniscus, left knee: Secondary | ICD-10-CM | POA: Diagnosis not present

## 2017-09-25 DIAGNOSIS — M531 Cervicobrachial syndrome: Secondary | ICD-10-CM | POA: Diagnosis not present

## 2017-09-25 DIAGNOSIS — M9902 Segmental and somatic dysfunction of thoracic region: Secondary | ICD-10-CM | POA: Diagnosis not present

## 2017-10-09 DIAGNOSIS — M5386 Other specified dorsopathies, lumbar region: Secondary | ICD-10-CM | POA: Diagnosis not present

## 2017-10-09 DIAGNOSIS — M23304 Other meniscus derangements, unspecified medial meniscus, left knee: Secondary | ICD-10-CM | POA: Diagnosis not present

## 2017-10-09 DIAGNOSIS — M9903 Segmental and somatic dysfunction of lumbar region: Secondary | ICD-10-CM | POA: Diagnosis not present

## 2017-12-18 DIAGNOSIS — M5387 Other specified dorsopathies, lumbosacral region: Secondary | ICD-10-CM | POA: Diagnosis not present

## 2017-12-18 DIAGNOSIS — M9902 Segmental and somatic dysfunction of thoracic region: Secondary | ICD-10-CM | POA: Diagnosis not present

## 2017-12-18 DIAGNOSIS — M9901 Segmental and somatic dysfunction of cervical region: Secondary | ICD-10-CM | POA: Diagnosis not present

## 2017-12-18 DIAGNOSIS — M25631 Stiffness of right wrist, not elsewhere classified: Secondary | ICD-10-CM | POA: Diagnosis not present

## 2017-12-24 DIAGNOSIS — M13841 Other specified arthritis, right hand: Secondary | ICD-10-CM | POA: Diagnosis not present

## 2017-12-24 DIAGNOSIS — M1712 Unilateral primary osteoarthritis, left knee: Secondary | ICD-10-CM | POA: Diagnosis not present

## 2017-12-25 DIAGNOSIS — M25532 Pain in left wrist: Secondary | ICD-10-CM | POA: Diagnosis not present

## 2017-12-25 DIAGNOSIS — M5387 Other specified dorsopathies, lumbosacral region: Secondary | ICD-10-CM | POA: Diagnosis not present

## 2017-12-25 DIAGNOSIS — M9902 Segmental and somatic dysfunction of thoracic region: Secondary | ICD-10-CM | POA: Diagnosis not present

## 2017-12-25 DIAGNOSIS — M9901 Segmental and somatic dysfunction of cervical region: Secondary | ICD-10-CM | POA: Diagnosis not present

## 2018-01-24 DIAGNOSIS — M25562 Pain in left knee: Secondary | ICD-10-CM | POA: Diagnosis not present

## 2018-02-12 DIAGNOSIS — M531 Cervicobrachial syndrome: Secondary | ICD-10-CM | POA: Diagnosis not present

## 2018-02-12 DIAGNOSIS — M5387 Other specified dorsopathies, lumbosacral region: Secondary | ICD-10-CM | POA: Diagnosis not present

## 2018-02-12 DIAGNOSIS — M722 Plantar fascial fibromatosis: Secondary | ICD-10-CM | POA: Diagnosis not present

## 2018-02-12 DIAGNOSIS — M9901 Segmental and somatic dysfunction of cervical region: Secondary | ICD-10-CM | POA: Diagnosis not present

## 2018-02-28 DIAGNOSIS — M1712 Unilateral primary osteoarthritis, left knee: Secondary | ICD-10-CM | POA: Diagnosis not present

## 2018-03-12 DIAGNOSIS — M25562 Pain in left knee: Secondary | ICD-10-CM | POA: Diagnosis not present

## 2018-04-08 DIAGNOSIS — M25562 Pain in left knee: Secondary | ICD-10-CM | POA: Diagnosis not present

## 2018-04-11 ENCOUNTER — Other Ambulatory Visit: Payer: Self-pay | Admitting: Obstetrics and Gynecology

## 2018-04-11 DIAGNOSIS — S83242A Other tear of medial meniscus, current injury, left knee, initial encounter: Secondary | ICD-10-CM | POA: Diagnosis not present

## 2018-04-11 DIAGNOSIS — Z3041 Encounter for surveillance of contraceptive pills: Secondary | ICD-10-CM

## 2018-04-30 DIAGNOSIS — M531 Cervicobrachial syndrome: Secondary | ICD-10-CM | POA: Diagnosis not present

## 2018-04-30 DIAGNOSIS — M9901 Segmental and somatic dysfunction of cervical region: Secondary | ICD-10-CM | POA: Diagnosis not present

## 2018-04-30 DIAGNOSIS — M722 Plantar fascial fibromatosis: Secondary | ICD-10-CM | POA: Diagnosis not present

## 2018-04-30 DIAGNOSIS — M5387 Other specified dorsopathies, lumbosacral region: Secondary | ICD-10-CM | POA: Diagnosis not present

## 2018-05-02 DIAGNOSIS — M25562 Pain in left knee: Secondary | ICD-10-CM | POA: Diagnosis not present

## 2018-05-02 DIAGNOSIS — M1712 Unilateral primary osteoarthritis, left knee: Secondary | ICD-10-CM | POA: Diagnosis not present

## 2018-05-07 ENCOUNTER — Other Ambulatory Visit: Payer: Self-pay | Admitting: Obstetrics and Gynecology

## 2018-05-07 DIAGNOSIS — Z3041 Encounter for surveillance of contraceptive pills: Secondary | ICD-10-CM

## 2018-05-09 ENCOUNTER — Telehealth: Payer: Self-pay

## 2018-05-09 DIAGNOSIS — Z3041 Encounter for surveillance of contraceptive pills: Secondary | ICD-10-CM

## 2018-05-09 MED ORDER — CAMILA 0.35 MG PO TABS: 1.0000 | ORAL_TABLET | Freq: Every day | ORAL | 12 refills | Status: DC

## 2018-05-10 NOTE — Telephone Encounter (Signed)
Patient is calling about her Birthcontrol needing an Refill .Patient is schedule 06/10/18 with [facility] for annual .  Please advise

## 2018-06-04 DIAGNOSIS — M531 Cervicobrachial syndrome: Secondary | ICD-10-CM | POA: Diagnosis not present

## 2018-06-04 DIAGNOSIS — M9901 Segmental and somatic dysfunction of cervical region: Secondary | ICD-10-CM | POA: Diagnosis not present

## 2018-06-10 ENCOUNTER — Ambulatory Visit (INDEPENDENT_AMBULATORY_CARE_PROVIDER_SITE_OTHER): Payer: BLUE CROSS/BLUE SHIELD | Admitting: Obstetrics and Gynecology

## 2018-06-10 ENCOUNTER — Encounter: Payer: Self-pay | Admitting: Obstetrics and Gynecology

## 2018-06-10 VITALS — BP 130/90 | HR 86 | Ht 64.0 in | Wt 191.0 lb

## 2018-06-10 DIAGNOSIS — Z3041 Encounter for surveillance of contraceptive pills: Secondary | ICD-10-CM | POA: Diagnosis not present

## 2018-06-10 DIAGNOSIS — Z1239 Encounter for other screening for malignant neoplasm of breast: Secondary | ICD-10-CM

## 2018-06-10 DIAGNOSIS — Z1211 Encounter for screening for malignant neoplasm of colon: Secondary | ICD-10-CM | POA: Diagnosis not present

## 2018-06-10 DIAGNOSIS — Z1231 Encounter for screening mammogram for malignant neoplasm of breast: Secondary | ICD-10-CM | POA: Diagnosis not present

## 2018-06-10 DIAGNOSIS — Z01419 Encounter for gynecological examination (general) (routine) without abnormal findings: Secondary | ICD-10-CM | POA: Diagnosis not present

## 2018-06-10 MED ORDER — NORETHINDRONE 0.35 MG PO TABS: 1.0000 | ORAL_TABLET | Freq: Every day | ORAL | 3 refills | Status: DC

## 2018-06-10 NOTE — Progress Notes (Signed)
Chief Complaint  Patient presents with  . Gynecologic Exam     HPI:      [name] is a 52 y.o. G0P0000 who LMP was Patient's last menstrual period was 05/27/2018 (exact date)., presents today for her annual examination.  Her menses are regular every 28-30 days, lasting 3-4 days, occas 12-14 days light bleeding on POPs.  Dysmenorrhea none. She does not have intermenstrual bleeding.  Sex activity: occas sexually active. She is on POPs and wants to continue them.  Last Pap: February 09, 2016  Results were: no abnormalities /neg HPV DNA  Hx of STDs: none  Last mammogram: Mar 04, 2015  Results were: normal--routine follow-up in 12 months There is no FH of breast cancer. There is no FH of ovarian cancer. The patient does not do self-breast exams.  Tobacco use: The patient denies current or previous tobacco use. Alcohol use: occas Exercise: moderately active but not recently with meniscus tear  She does not get adequate calcium and Vitamin D in her diet. She had normal labs 2016.   Past Medical History:  Diagnosis Date  . Bulging lumbar disc   . History of mammogram 03/04/2015   BIRAD 1  . History of Papanicolaou smear of cervix 02/09/2016   NIL/NEG  . Hypertension   . Pap smear abnormality of cervix/human papillomavirus (HPV) positive 02/05/2015   NIL Pap with positive HPV aptima.  HPV 16,18.45 neg    History reviewed. No pertinent surgical history.  Family History  Problem Relation Age of Onset  . Hypertension Father   . Diabetes Paternal Uncle   . Diabetes Paternal Grandfather   . Ovarian cancer Neg Hx   . Breast cancer Neg Hx   . Colon cancer Neg Hx     Social History   Socioeconomic History  . Marital status: Single    Spouse name: Not on file  . Number of children: Not on file  . Years of education: Not on file  . Highest education level: Not on file  Occupational History  . Not on file  Social Needs  . Financial resource strain: Not on file    . Food insecurity:    Worry: Not on file    Inability: Not on file  . Transportation needs:    Medical: Not on file    Non-medical: Not on file  Tobacco Use  . Smoking status: Never Smoker  . Smokeless tobacco: Never Used  Substance and Sexual Activity  . Alcohol use: Yes    Alcohol/week: 0.0 standard drinks    Comment: social  . Drug use: No  . Sexual activity: Yes    Birth control/protection: Pill  Lifestyle  . Physical activity:    Days per week: Not on file    Minutes per session: Not on file  . Stress: Not on file  Relationships  . Social connections:    Talks on phone: Not on file    Gets together: Not on file    Attends religious service: Not on file    Active member of club or organization: Not on file    Attends meetings of clubs or organizations: Not on file    Relationship status: Not on file  . Intimate partner violence:    Fear of current or ex partner: Not on file    Emotionally abused: Not on file    Physically abused: Not on file    Forced sexual activity: Not on file  Other Topics Concern  .  Not on file  Social History Narrative  . Not on file     Current Outpatient Medications:  .  norethindrone (CAMILA) 0.35 MG tablet, Take 1 tablet (0.35 mg total) by mouth daily., Disp: 3 Package, Rfl: 3  ROS:  Review of Systems  Constitutional: Negative for fatigue, fever and unexpected weight change.  Respiratory: Negative for cough, shortness of breath and wheezing.   Cardiovascular: Negative for chest pain, palpitations and leg swelling.  Gastrointestinal: Negative for blood in stool, constipation, diarrhea, nausea and vomiting.  Endocrine: Negative for cold intolerance, heat intolerance and polyuria.  Genitourinary: Negative for dyspareunia, dysuria, flank pain, frequency, genital sores, hematuria, menstrual problem, pelvic pain, urgency, vaginal bleeding, vaginal discharge and vaginal pain.  Musculoskeletal: Negative for back pain, joint swelling and  myalgias.  Skin: Negative for rash.  Neurological: Negative for dizziness, syncope, light-headedness, numbness and headaches.  Hematological: Negative for adenopathy.  Psychiatric/Behavioral: Negative for agitation, confusion, sleep disturbance and suicidal ideas. The patient is not nervous/anxious.      Objective: BP 130/90   Pulse 86   Ht 5\' 4"  (1.626 m)   Wt 191 lb (86.6 kg)   LMP 05/27/2018 (Exact Date)   BMI 32.79 kg/m    Physical Exam  Constitutional: She is oriented to person, place, and time. She appears well-developed and well-nourished.  Genitourinary: Vagina normal and uterus normal. There is no rash or tenderness on the right labia. There is no rash or tenderness on the left labia. No erythema or tenderness in the vagina. No vaginal discharge found. Right adnexum does not display mass and does not display tenderness. Left adnexum does not display mass and does not display tenderness. Cervix does not exhibit motion tenderness or polyp. Uterus is not enlarged or tender.  Neck: Normal range of motion. No thyromegaly present.  Cardiovascular: Normal rate, regular rhythm and normal heart sounds.  No murmur heard. Pulmonary/Chest: Effort normal and breath sounds normal. Right breast exhibits no mass, no nipple discharge, no skin change and no tenderness. Left breast exhibits no mass, no nipple discharge, no skin change and no tenderness.  Abdominal: Soft. There is no tenderness. There is no guarding.  Musculoskeletal: Normal range of motion.  Neurological: She is alert and oriented to person, place, and time. No cranial nerve deficit.  Psychiatric: She has a normal mood and affect. Her behavior is normal.  Vitals reviewed.   Assessment/Plan: Encounter for annual routine gynecological examination  Screening for breast cancer - Pt to sched mammo - Plan: MM DIGITAL SCREENING BILATERAL  Screening for colon cancer - Colonoscopy recommended. Pt would like to do Cologuard. Ref  sent.  - Plan: Cologuard  Encounter for surveillance of contraceptive pills - OCP RF. - Plan: norethindrone (CAMILA) 0.35 MG tablet             GYN counsel mammography screening, adequate intake of calcium and vitamin D, diet and exercise     F/U  Return in about 1 year (around 06/11/2019).  Baraa Tubbs B. Lacrecia Delval, PA-C 06/10/2018 5:14 PM

## 2018-06-10 NOTE — Patient Instructions (Signed)
I value your feedback and entrusting us with your care. If you get a [facility] patient survey, I would appreciate you taking the time to let us know about your experience today. Thank you! 

## 2018-06-18 DIAGNOSIS — M5387 Other specified dorsopathies, lumbosacral region: Secondary | ICD-10-CM | POA: Diagnosis not present

## 2018-06-18 DIAGNOSIS — M9901 Segmental and somatic dysfunction of cervical region: Secondary | ICD-10-CM | POA: Diagnosis not present

## 2018-06-18 DIAGNOSIS — M5386 Other specified dorsopathies, lumbar region: Secondary | ICD-10-CM | POA: Diagnosis not present

## 2018-06-18 DIAGNOSIS — M531 Cervicobrachial syndrome: Secondary | ICD-10-CM | POA: Diagnosis not present

## 2018-07-23 DIAGNOSIS — Z1211 Encounter for screening for malignant neoplasm of colon: Secondary | ICD-10-CM

## 2018-07-23 HISTORY — DX: Encounter for screening for malignant neoplasm of colon: Z12.11

## 2018-08-06 DIAGNOSIS — M9903 Segmental and somatic dysfunction of lumbar region: Secondary | ICD-10-CM | POA: Diagnosis not present

## 2018-08-06 DIAGNOSIS — M722 Plantar fascial fibromatosis: Secondary | ICD-10-CM | POA: Diagnosis not present

## 2018-08-06 DIAGNOSIS — M531 Cervicobrachial syndrome: Secondary | ICD-10-CM | POA: Diagnosis not present

## 2018-08-06 DIAGNOSIS — M5387 Other specified dorsopathies, lumbosacral region: Secondary | ICD-10-CM | POA: Diagnosis not present

## 2018-08-08 DIAGNOSIS — M2392 Unspecified internal derangement of left knee: Secondary | ICD-10-CM | POA: Diagnosis not present

## 2018-08-13 ENCOUNTER — Telehealth: Payer: Self-pay

## 2018-08-13 NOTE — Telephone Encounter (Signed)
Called pt to follow up on the Cologuard test, is she still going to do it? Left vm to call back.

## 2018-08-13 NOTE — Telephone Encounter (Signed)
Pt calling back and says she is going to do it. Says she had her period for a little too long, then she just got busy with other things, but states she will do it.

## 2018-08-14 DIAGNOSIS — Z1212 Encounter for screening for malignant neoplasm of rectum: Secondary | ICD-10-CM | POA: Diagnosis not present

## 2018-08-14 DIAGNOSIS — Z1211 Encounter for screening for malignant neoplasm of colon: Secondary | ICD-10-CM | POA: Diagnosis not present

## 2018-08-21 ENCOUNTER — Encounter: Payer: Self-pay | Admitting: Obstetrics and Gynecology

## 2018-08-21 ENCOUNTER — Telehealth: Payer: Self-pay | Admitting: Obstetrics and Gynecology

## 2018-08-21 NOTE — Telephone Encounter (Signed)
Pt aware of neg Cologuard results.

## 2018-09-03 DIAGNOSIS — M5387 Other specified dorsopathies, lumbosacral region: Secondary | ICD-10-CM | POA: Diagnosis not present

## 2018-09-03 DIAGNOSIS — M9903 Segmental and somatic dysfunction of lumbar region: Secondary | ICD-10-CM | POA: Diagnosis not present

## 2018-09-03 DIAGNOSIS — M9902 Segmental and somatic dysfunction of thoracic region: Secondary | ICD-10-CM | POA: Diagnosis not present

## 2018-09-03 DIAGNOSIS — M531 Cervicobrachial syndrome: Secondary | ICD-10-CM | POA: Diagnosis not present

## 2018-10-08 DIAGNOSIS — M9903 Segmental and somatic dysfunction of lumbar region: Secondary | ICD-10-CM | POA: Diagnosis not present

## 2018-10-08 DIAGNOSIS — M531 Cervicobrachial syndrome: Secondary | ICD-10-CM | POA: Diagnosis not present

## 2018-10-08 DIAGNOSIS — M5387 Other specified dorsopathies, lumbosacral region: Secondary | ICD-10-CM | POA: Diagnosis not present

## 2018-10-08 DIAGNOSIS — M9902 Segmental and somatic dysfunction of thoracic region: Secondary | ICD-10-CM | POA: Diagnosis not present

## 2018-12-03 DIAGNOSIS — M5387 Other specified dorsopathies, lumbosacral region: Secondary | ICD-10-CM | POA: Diagnosis not present

## 2018-12-03 DIAGNOSIS — M531 Cervicobrachial syndrome: Secondary | ICD-10-CM | POA: Diagnosis not present

## 2018-12-03 DIAGNOSIS — M9903 Segmental and somatic dysfunction of lumbar region: Secondary | ICD-10-CM | POA: Diagnosis not present

## 2019-03-04 DIAGNOSIS — M5387 Other specified dorsopathies, lumbosacral region: Secondary | ICD-10-CM | POA: Diagnosis not present

## 2019-03-04 DIAGNOSIS — M9908 Segmental and somatic dysfunction of rib cage: Secondary | ICD-10-CM | POA: Diagnosis not present

## 2019-03-04 DIAGNOSIS — M9903 Segmental and somatic dysfunction of lumbar region: Secondary | ICD-10-CM | POA: Diagnosis not present

## 2019-03-04 DIAGNOSIS — M531 Cervicobrachial syndrome: Secondary | ICD-10-CM | POA: Diagnosis not present

## 2019-03-19 ENCOUNTER — Other Ambulatory Visit: Payer: Self-pay | Admitting: Obstetrics and Gynecology

## 2019-03-19 DIAGNOSIS — Z3041 Encounter for surveillance of contraceptive pills: Secondary | ICD-10-CM

## 2019-04-22 DIAGNOSIS — M9902 Segmental and somatic dysfunction of thoracic region: Secondary | ICD-10-CM | POA: Diagnosis not present

## 2019-04-22 DIAGNOSIS — M436 Torticollis: Secondary | ICD-10-CM | POA: Diagnosis not present

## 2019-04-22 DIAGNOSIS — M9901 Segmental and somatic dysfunction of cervical region: Secondary | ICD-10-CM | POA: Diagnosis not present

## 2019-04-22 DIAGNOSIS — M5387 Other specified dorsopathies, lumbosacral region: Secondary | ICD-10-CM | POA: Diagnosis not present

## 2019-05-06 DIAGNOSIS — M7612 Psoas tendinitis, left hip: Secondary | ICD-10-CM | POA: Diagnosis not present

## 2019-05-06 DIAGNOSIS — M7611 Psoas tendinitis, right hip: Secondary | ICD-10-CM | POA: Diagnosis not present

## 2019-05-06 DIAGNOSIS — M5387 Other specified dorsopathies, lumbosacral region: Secondary | ICD-10-CM | POA: Diagnosis not present

## 2019-05-06 DIAGNOSIS — M531 Cervicobrachial syndrome: Secondary | ICD-10-CM | POA: Diagnosis not present

## 2019-05-13 DIAGNOSIS — M531 Cervicobrachial syndrome: Secondary | ICD-10-CM | POA: Diagnosis not present

## 2019-05-13 DIAGNOSIS — M5387 Other specified dorsopathies, lumbosacral region: Secondary | ICD-10-CM | POA: Diagnosis not present

## 2019-05-13 DIAGNOSIS — M7612 Psoas tendinitis, left hip: Secondary | ICD-10-CM | POA: Diagnosis not present

## 2019-05-13 DIAGNOSIS — M7611 Psoas tendinitis, right hip: Secondary | ICD-10-CM | POA: Diagnosis not present

## 2019-05-20 DIAGNOSIS — M9902 Segmental and somatic dysfunction of thoracic region: Secondary | ICD-10-CM | POA: Diagnosis not present

## 2019-05-20 DIAGNOSIS — M531 Cervicobrachial syndrome: Secondary | ICD-10-CM | POA: Diagnosis not present

## 2019-05-20 DIAGNOSIS — M530 Cervicocranial syndrome: Secondary | ICD-10-CM | POA: Diagnosis not present

## 2019-05-20 DIAGNOSIS — M5386 Other specified dorsopathies, lumbar region: Secondary | ICD-10-CM | POA: Diagnosis not present

## 2019-06-05 ENCOUNTER — Other Ambulatory Visit: Payer: Self-pay | Admitting: Obstetrics and Gynecology

## 2019-06-05 DIAGNOSIS — Z3041 Encounter for surveillance of contraceptive pills: Secondary | ICD-10-CM

## 2019-06-28 ENCOUNTER — Emergency Department
Admission: EM | Admit: 2019-06-28 | Discharge: 2019-06-28 | Disposition: A | Discharge status: Home / Self Care | Payer: BC Managed Care – PPO | Attending: Student in an Organized Health Care Education/Training Program | Admitting: Student in an Organized Health Care Education/Training Program

## 2019-06-28 ENCOUNTER — Other Ambulatory Visit: Payer: Self-pay

## 2019-06-28 ENCOUNTER — Encounter: Payer: Self-pay | Admitting: Emergency Medicine

## 2019-06-28 DIAGNOSIS — T63441A Toxic effect of venom of bees, accidental (unintentional), initial encounter: Secondary | ICD-10-CM

## 2019-06-28 DIAGNOSIS — Z79899 Other long term (current) drug therapy: Secondary | ICD-10-CM | POA: Insufficient documentation

## 2019-06-28 DIAGNOSIS — T63461A Toxic effect of venom of wasps, accidental (unintentional), initial encounter: Secondary | ICD-10-CM | POA: Diagnosis not present

## 2019-06-28 DIAGNOSIS — I10 Essential (primary) hypertension: Secondary | ICD-10-CM | POA: Insufficient documentation

## 2019-06-28 MED ORDER — PREDNISONE 20 MG PO TABS
60.0000 mg | ORAL_TABLET | Freq: Once | ORAL | Status: AC
  Administered 2019-06-28: 60 mg via ORAL
  Filled 2019-06-28: qty 3

## 2019-06-28 MED ORDER — ONDANSETRON 8 MG PO TBDP
8.0000 mg | ORAL_TABLET | Freq: Once | ORAL | Status: AC
  Administered 2019-06-28: 8 mg via ORAL
  Filled 2019-06-28: qty 1

## 2019-06-28 MED ORDER — HYDROCODONE-ACETAMINOPHEN 5-325 MG PO TABS
1.0000 | ORAL_TABLET | Freq: Once | ORAL | Status: AC
  Administered 2019-06-28: 1 via ORAL
  Filled 2019-06-28: qty 1

## 2019-06-28 MED ORDER — HYDROCODONE-ACETAMINOPHEN 5-325 MG PO TABS: 1.0000 | ORAL_TABLET | ORAL | 0 refills | Status: DC | PRN

## 2019-06-28 MED ORDER — CETIRIZINE HCL 10 MG PO TABS: 10.0000 mg | ORAL_TABLET | Freq: Every day | ORAL | 0 refills | Status: DC

## 2019-06-28 MED ORDER — LIDOCAINE 5 % EX OINT: 1.0000 "application " | TOPICAL_OINTMENT | CUTANEOUS | 1 refills | Status: DC | PRN

## 2019-06-28 MED ORDER — LIDOCAINE HCL URETHRAL/MUCOSAL 2 % EX GEL
1.0000 "application " | Freq: Once | CUTANEOUS | Status: AC
  Administered 2019-06-28: 1 via TOPICAL
  Filled 2019-06-28: qty 5

## 2019-06-28 NOTE — ED Provider Notes (Signed)
[facility] Emergency Department Provider Note  ____________________________________________  Time seen: Approximately 10:42 PM  I have reviewed the triage vital signs and the nursing notes.   HISTORY  Chief Complaint Insect Bite    HPI Krista Blake is a 53 y.o. female who presents the emergency department complaining of pain from yellowjacket stings.  Patient reports that she was in her flower bed, when she stepped on a "soft spot".  Patient reports that this happened to be a yellowjacket nest in the swarmed stinging her numerous times on the right foot, right leg, left chest, back and bilateral arms.  Patient is not allergic to any bee stings.  She states that she has adverse reaction to Benadryl with significant somnolence 4 days after the last time she took Benadryl.  Patient took some Motrin but states that the pain was severe.  She does not have any other medications.  She denies any rash other than whelps from yellowjacket stings, no swelling of the face, tongue, shortness of breath, wheezing.  Patient reports that she is nauseated from the pain but has not had emesis.         Past Medical History:  Diagnosis Date  . Bulging lumbar disc   . History of mammogram 03/04/2015   BIRAD 1  . History of Papanicolaou smear of cervix 02/09/2016   NIL/NEG  . Hypertension   . Pap smear abnormality of cervix/human papillomavirus (HPV) positive 02/05/2015   NIL Pap with positive HPV aptima.  HPV 16,18.45 neg  . Screening for colon cancer 07/2018   neg Cologuard; repeat in 3 yrs    Patient Active Problem List   Diagnosis Date Noted  . HTN (hypertension) 11/04/2014    History reviewed. No pertinent surgical history.  Prior to Admission medications   Medication Sig Start Date End Date Taking? Authorizing Provider  CAMILA 0.35 MG tablet TAKE 1 TABLET BY MOUTH EVERY DAY [id number]   Copland, Elmo Putt B, PA-C  cetirizine (ZYRTEC) 10 MG tablet Take 1 tablet  (10 mg total) by mouth daily. 06/28/19   Cereniti Curb, Charline Bills, PA-C  HYDROcodone-acetaminophen (NORCO/VICODIN) 5-325 MG tablet Take 1 tablet by mouth every 4 (four) hours as needed for moderate pain. 06/28/19   Gradie Ohm, Charline Bills, PA-C  lidocaine (XYLOCAINE) 5 % ointment Apply 1 application topically as needed. 06/28/19   Kateryna Grantham, Charline Bills, PA-C    Allergies Penicillins  Family History  Problem Relation Age of Onset  . Hypertension Father   . Diabetes Paternal Uncle   . Diabetes Paternal Grandfather   . Ovarian cancer Neg Hx   . Breast cancer Neg Hx   . Colon cancer Neg Hx     Social History Social History   Tobacco Use  . Smoking status: Never Smoker  . Smokeless tobacco: Never Used  Substance Use Topics  . Alcohol use: Yes    Alcohol/week: 0.0 standard drinks    Comment: social  . Drug use: No     Review of Systems  Constitutional: No fever/chills Eyes: No visual changes. No discharge ENT: No upper respiratory complaints. Cardiovascular: no chest pain. Respiratory: no cough. No SOB. Gastrointestinal: No abdominal pain.  No nausea, no vomiting.  No diarrhea.  No constipation. Musculoskeletal: Negative for musculoskeletal pain. Skin: Numerous bee stings to the right lower extremity, bilateral upper extremities, left chest wall and back. Neurological: Negative for headaches, focal weakness or numbness. 10-point ROS otherwise negative.  ____________________________________________   PHYSICAL EXAM:  VITAL SIGNS: ED Triage Vitals  Enc Vitals Group     BP 06/28/19 2212 (!) 156/98     Pulse Rate 06/28/19 2212 (!) 101     Resp 06/28/19 2212 18     Temp 06/28/19 2212 98.2 F (36.8 C)     Temp Source 06/28/19 2212 Oral     SpO2 06/28/19 2212 99 %     Weight 06/28/19 2213 200 lb (90.7 kg)     Height 06/28/19 2213 5\' 4"  (1.626 m)     Head Circumference --      Peak Flow --      Pain Score 06/28/19 2213 10     Pain Loc --      Pain Edu? --      Excl. in [place]? --       Constitutional: Alert and oriented. Well appearing and in no acute distress. Eyes: Conjunctivae are normal. PERRL. EOMI. Head: Atraumatic. ENT:      Ears:       Nose: No congestion/rhinnorhea.      Mouth/Throat: Mucous membranes are moist.  Neck: No stridor.    Cardiovascular: Normal rate, regular rhythm. Normal S1 and S2.  Good peripheral circulation. Respiratory: Normal respiratory effort without tachypnea or retractions. Lungs CTAB. Good air entry to the bases with no decreased or absent breath sounds. Musculoskeletal: Full range of motion to all extremities. No gross deformities appreciated. Neurologic:  Normal speech and language. No gross focal neurologic deficits are appreciated.  Skin:  Skin is warm, dry and intact.  Patient with numerous erythematous lesions consistent with yellowjacket stings noted to the right foot, right lower extremity, left chest wall, back, bilateral forearms.  No other hives or edema noted.  No facial edema.  No angioedema. Psychiatric: Mood and affect are normal. Speech and behavior are normal. Patient exhibits appropriate insight and judgement.   ____________________________________________   LABS (all labs ordered are listed, but only abnormal results are displayed)  Labs Reviewed - No data to display ____________________________________________  EKG   ____________________________________________  RADIOLOGY   No results found.  ____________________________________________    PROCEDURES  Procedure(s) performed:    Procedures    Medications  predniSONE (DELTASONE) tablet 60 mg (60 mg Oral Given 06/28/19 2253)  lidocaine (XYLOCAINE) 2 % jelly 1 application (1 application Topical Given 06/28/19 2254)  HYDROcodone-acetaminophen (NORCO/VICODIN) 5-325 MG per tablet 1 tablet (1 tablet Oral Given 06/28/19 2253)  ondansetron (ZOFRAN-ODT) disintegrating tablet 8 mg (8 mg Oral Given 06/28/19 2253)      ____________________________________________   INITIAL IMPRESSION / ASSESSMENT AND PLAN / ED COURSE  Pertinent labs & imaging results that were available during my care of the patient were reviewed by me and considered in my medical decision making (see chart for details).  Review of the [place] CSRS was performed in accordance of the [place] prior to dispensing any controlled drugs.           Patient's diagnosis is consistent with multiple yellowjacket stings.  Patient presented to the emergency department complaining of significant pain from yellowjacket stings.  Patient has been stung numerous times after stepping on a yellowjacket nest.  Patient has no evidence of anaphylaxis or allergic reaction other than localized reaction to the stings.  Patient is given Vicodin, topical lidocaine, 1 tablet of prednisone, Zofran here in the emergency department.  Patient is encouraged to use Zyrtec at home for antihistamine.  Patient will be prescribed limited pain medication and topical lidocaine to assist with pain relief..  Follow-up primary care as needed.  Patient is given ED precautions to return to the ED for any worsening or new symptoms.     ____________________________________________  FINAL CLINICAL IMPRESSION(S) / ED DIAGNOSES  Final diagnoses:  Bee sting, accidental or unintentional, initial encounter      NEW MEDICATIONS STARTED DURING THIS VISIT:  ED Discharge Orders         Ordered    HYDROcodone-acetaminophen (NORCO/VICODIN) 5-325 MG tablet  Every 4 hours PRN     06/28/19 2256    cetirizine (ZYRTEC) 10 MG tablet  Daily     06/28/19 2256    lidocaine (XYLOCAINE) 5 % ointment  As needed     06/28/19 2256              This chart was dictated using voice recognition software/Dragon. Despite best efforts to proofread, errors can occur which can change the meaning. Any change was purely unintentional.    Darletta Moll, PA-C 06/28/19 2256    Merlyn Lot, MD 06/28/19 [phone]

## 2019-06-28 NOTE — ED Notes (Signed)
Pt says she accidentally walked through a yellow jacket nest; has been stung numerous times; reports whelps; denies shortness of breath; denies oral swelling or difficulty swallowing

## 2019-06-28 NOTE — ED Triage Notes (Signed)
Patient states that she was cleaning out a flower bed about 3 hours ago and states that she got sting bu multiple yellow jackets. Patient states that she is having a lot of pain. Patient denies any difficulty breathing.

## 2019-07-03 ENCOUNTER — Ambulatory Visit: Payer: Self-pay

## 2019-07-03 NOTE — Telephone Encounter (Signed)
Summary: need advise   Pt stated she went to ER 9.5.20 b/c she stepped on a yellow jacket nest. Pt stated the areas are red and hot and need to speak to someone.      Patient reports the whelps are hot and red.Patient is having increased itching too. Advised with no PCP- UC would be best for follow up and additional treatment as needed.  Reason for Disposition . [1] Red or very tender (to touch) area AND [2] getting larger over 48 hours after the sting  Answer Assessment - Initial Assessment Questions 1. TYPE: "What type of sting was it?" (bee, yellow jacket, etc.)      Yellow jacket nest 2. ONSET: "When did it occur?"      Saturday night 3. LOCATION: "Where is the sting located?"  "How many stings?"     All over body 4. SWELLING SIZE: "How big is the swelling?" (e.g., inches or cm)     Only the whelps are swollen 5. REDNESS: "Is the area red or pink?" If so, ask "What size is area of redness?" (e.g., inches or cm). "When did the redness start?"     Red and hot- largest area of concern is abdomen- egg size to football size 6. PAIN: "Is there any pain?" If so, ask: "How bad is it?"  (Scale 1-10; or mild, moderate, severe)     No pain- sore and more itching 7. ITCHING: "Is there any itching?" If so, ask: "How bad is it?"      Yes- it is itching 8. RESPIRATORY DISTRESS: "Describe your breathing."     No problem with breathing 9. PRIOR REACTIONS: "Have you had any severe allergic reactions to stings in the past?" if yes, ask: "What happened?"     no 10. OTHER SYMPTOMS: "Do you have any other symptoms?" (e.g., abdominal pain, face or tongue swelling, new rash elsewhere, vomiting)       no 11. PREGNANCY: "Is there any chance you are pregnant?" "When was your last menstrual period?"       n/a  Protocols used: BEE OR YELLOW JACKET STING-A-AH

## 2019-07-03 NOTE — Telephone Encounter (Signed)
Attempted to contact patient about her yellow jacket stings that she was see in Er 06/25/19. I was unable to leave message. Mailbox is full.

## 2019-07-04 ENCOUNTER — Other Ambulatory Visit: Payer: Self-pay

## 2019-07-04 ENCOUNTER — Other Ambulatory Visit: Payer: Self-pay | Admitting: Obstetrics and Gynecology

## 2019-07-04 DIAGNOSIS — Z3041 Encounter for surveillance of contraceptive pills: Secondary | ICD-10-CM

## 2019-07-04 DIAGNOSIS — Z1231 Encounter for screening mammogram for malignant neoplasm of breast: Secondary | ICD-10-CM

## 2019-07-04 MED ORDER — CAMILA 0.35 MG PO TABS: 1.0000 | ORAL_TABLET | Freq: Every day | ORAL | 0 refills | Status: DC

## 2019-07-10 ENCOUNTER — Ambulatory Visit
Admission: RE | Admit: 2019-07-10 | Discharge: 2019-07-10 | Disposition: A | Discharge status: Home / Self Care | Payer: BC Managed Care – PPO | Source: Ambulatory Visit | Attending: Obstetrics and Gynecology | Admitting: Obstetrics and Gynecology

## 2019-07-10 DIAGNOSIS — Z1231 Encounter for screening mammogram for malignant neoplasm of breast: Secondary | ICD-10-CM | POA: Diagnosis not present

## 2019-07-10 IMAGING — MG MM DIGITAL SCREENING BILAT W/ TOMO W/ CAD
8 series · 8 of 24 positions shown · non-contrast
Comparison: Previous exam(s).

CLINICAL DATA: Screening.

EXAM:
DIGITAL SCREENING BILATERAL MAMMOGRAM WITH TOMO AND CAD

[R CC synth-2D]
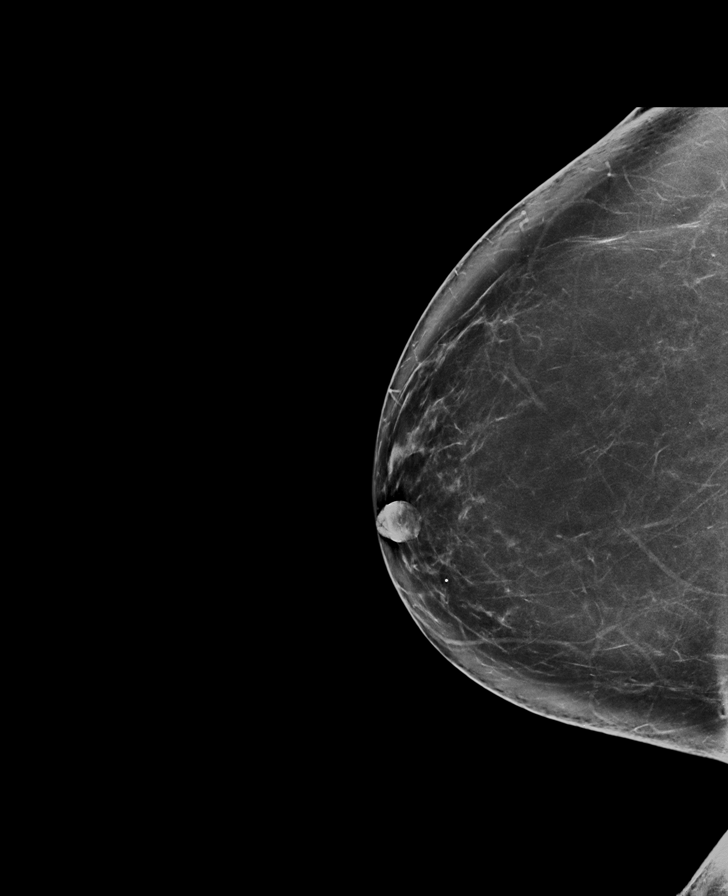

[L CC synth-2D]
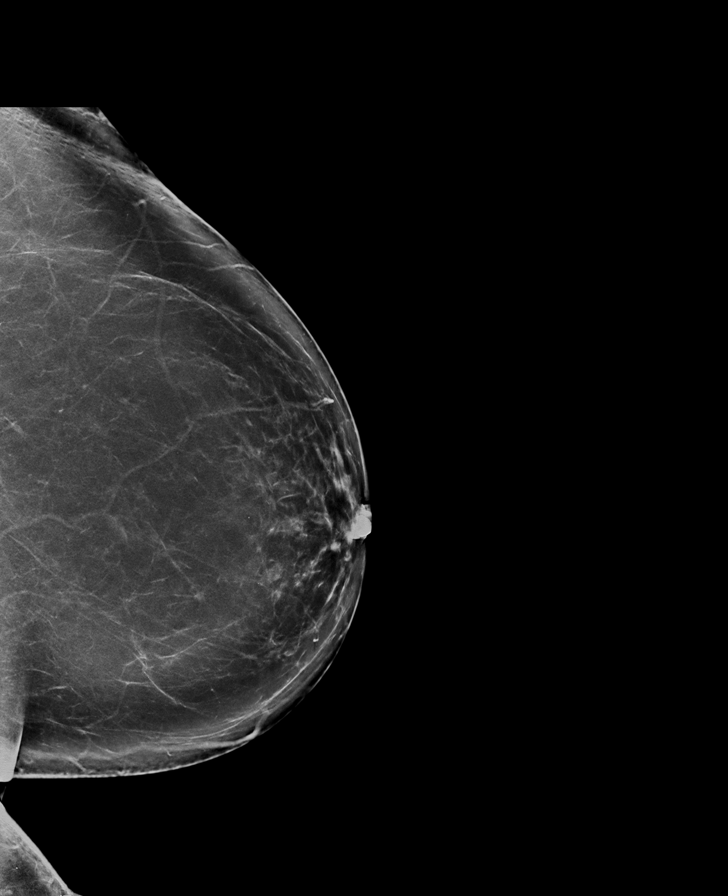

[R MLO synth-2D]
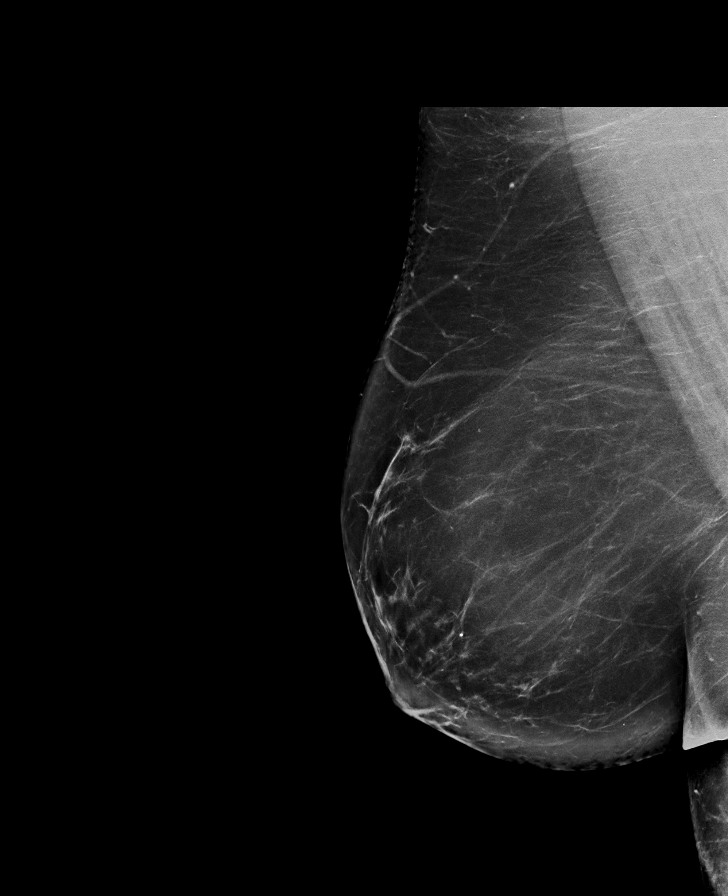

[L MLO synth-2D]
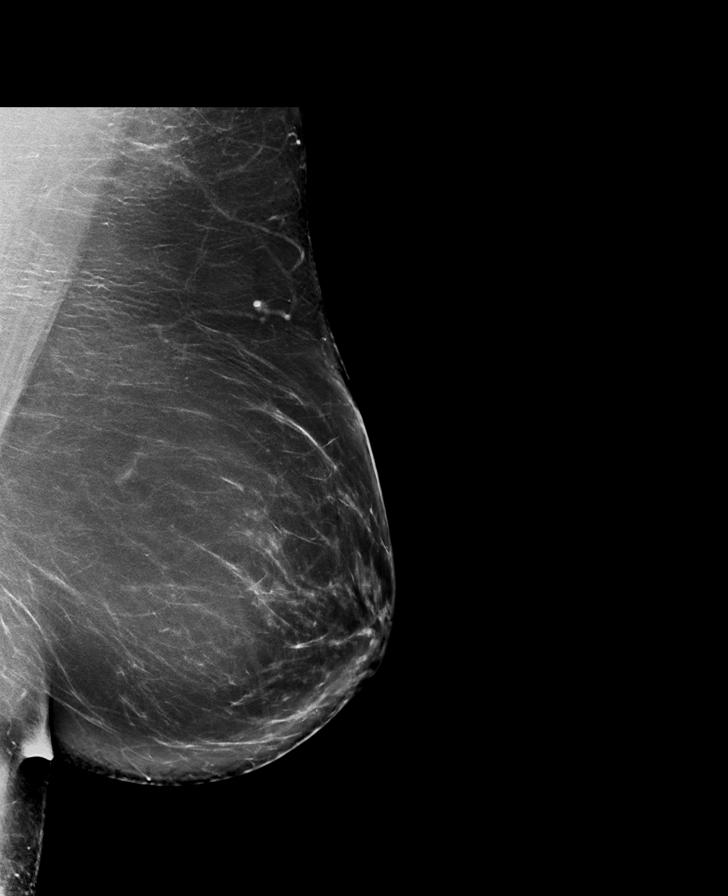

[L MLO tomo · tomo slice 56/111.0]
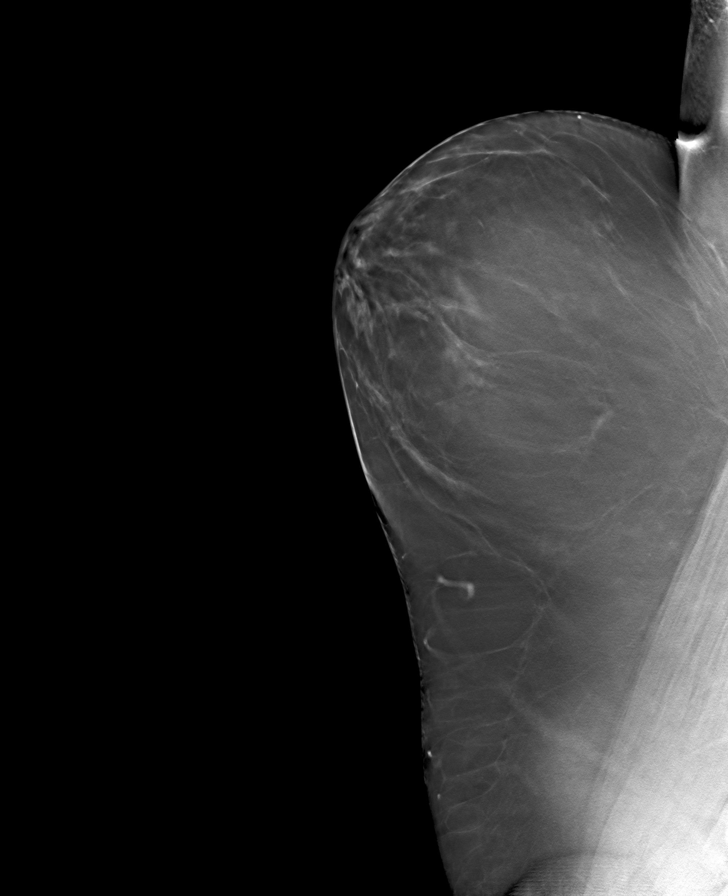

[L CC tomo · tomo slice 48/95.0]
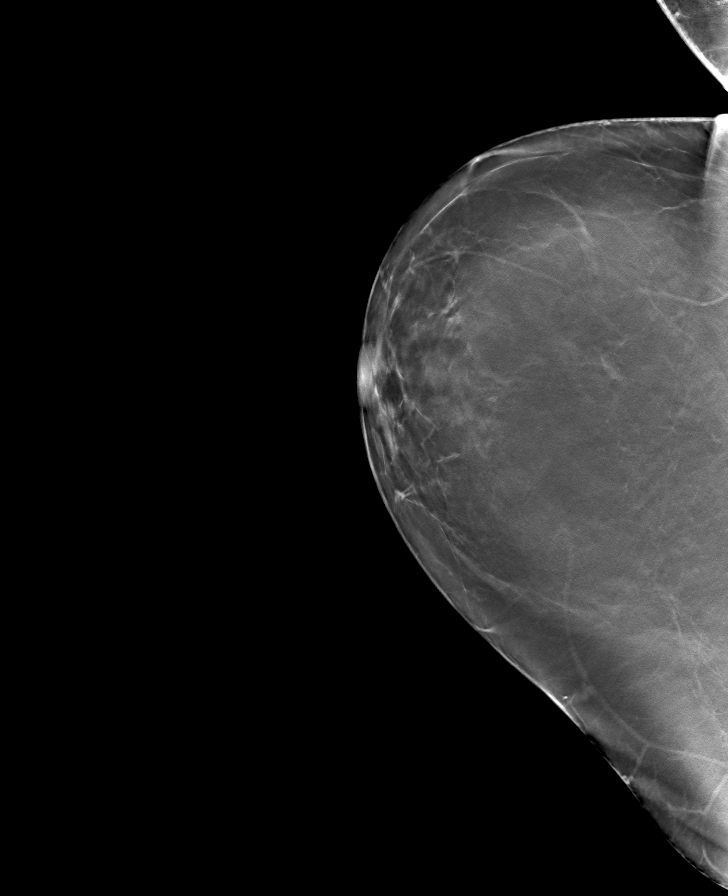

[R CC tomo · tomo slice 47/92.0]
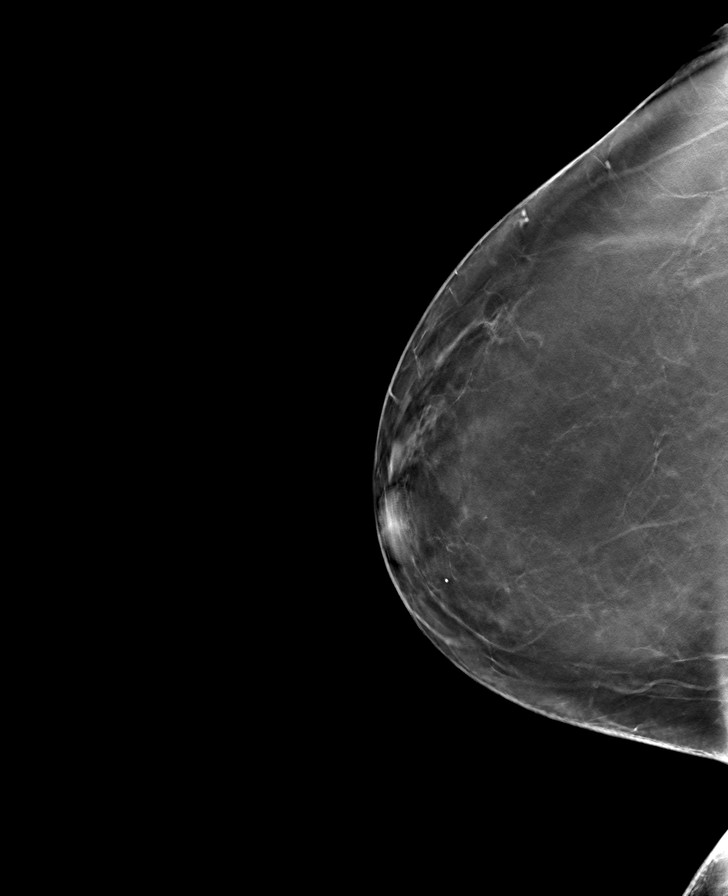

[R MLO tomo · tomo slice 53/106.0]
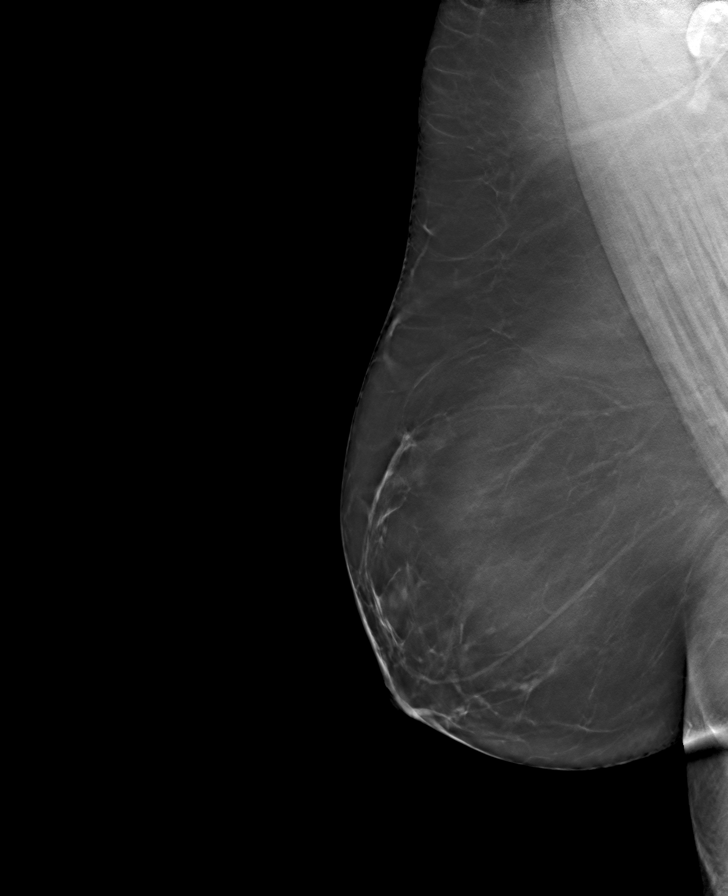

[8 of 24 positions shown; findings below may reference images not displayed]

ACR Breast Density Category b: There are scattered areas of
fibroglandular density.
FINDINGS: There are no findings suspicious for malignancy. Images were
processed with CAD.
IMPRESSION: No mammographic evidence of malignancy. A result letter of this
screening mammogram will be mailed directly to the patient.

RECOMMENDATION:
Screening mammogram in one year. (Code:[TQ])

BI-RADS CATEGORY  1: Negative.

## 2019-07-11 ENCOUNTER — Encounter: Payer: Self-pay | Admitting: Obstetrics and Gynecology

## 2019-07-26 ENCOUNTER — Other Ambulatory Visit: Payer: Self-pay | Admitting: Obstetrics and Gynecology

## 2019-07-26 DIAGNOSIS — Z3041 Encounter for surveillance of contraceptive pills: Secondary | ICD-10-CM

## 2019-08-05 ENCOUNTER — Ambulatory Visit (INDEPENDENT_AMBULATORY_CARE_PROVIDER_SITE_OTHER): Payer: BC Managed Care – PPO | Admitting: Obstetrics and Gynecology

## 2019-08-05 ENCOUNTER — Encounter: Payer: Self-pay | Admitting: Obstetrics and Gynecology

## 2019-08-05 ENCOUNTER — Other Ambulatory Visit: Payer: Self-pay

## 2019-08-05 VITALS — BP 140/108 | Ht 64.0 in | Wt 224.0 lb

## 2019-08-05 DIAGNOSIS — Z13220 Encounter for screening for lipoid disorders: Secondary | ICD-10-CM

## 2019-08-05 DIAGNOSIS — Z01419 Encounter for gynecological examination (general) (routine) without abnormal findings: Secondary | ICD-10-CM | POA: Diagnosis not present

## 2019-08-05 DIAGNOSIS — Z131 Encounter for screening for diabetes mellitus: Secondary | ICD-10-CM

## 2019-08-05 DIAGNOSIS — Z3041 Encounter for surveillance of contraceptive pills: Secondary | ICD-10-CM

## 2019-08-05 DIAGNOSIS — Z0000 Encounter for general adult medical examination without abnormal findings: Secondary | ICD-10-CM

## 2019-08-05 DIAGNOSIS — R030 Elevated blood-pressure reading, without diagnosis of hypertension: Secondary | ICD-10-CM

## 2019-08-05 MED ORDER — CAMILA 0.35 MG PO TABS: 1.0000 | ORAL_TABLET | Freq: Every day | ORAL | 3 refills | Status: DC

## 2019-08-05 NOTE — Patient Instructions (Signed)
I value your feedback and entrusting us with your care. If you get a [facility] patient survey, I would appreciate you taking the time to let us know about your experience today. Thank you! 

## 2019-08-05 NOTE — Progress Notes (Signed)
Chief Complaint  Patient presents with  . Gynecologic Exam     HPI:      [name] is a 53 y.o. G0P0000 who LMP was No LMP recorded (lmp unknown). (Menstrual status: Irregular Periods)., presents today for her annual examination.  Her menses are irregular now due to perimenopause, about Q1-3 months, lasting 7-10 days light bleeding on POPs.  Dysmenorrhea none. She does not have intermenstrual bleeding.  Sex activity: not sexually active. She is on POPs and wants to continue them.  Last Pap: February 09, 2016  Results were: no abnormalities /neg HPV DNA  Hx of STDs: none  Last mammogram: 07/10/19  Results were: normal--routine follow-up in 12 months There is no FH of breast cancer. There is no FH of ovarian cancer. The patient does not do self-breast exams.  Tobacco use: The patient denies current or previous tobacco use. Alcohol use: occas No drug use Exercise: not active due to meniscus tear; has had wt gain due to lack of exercise, increased calories with sodas/tea  She does not get adequate calcium and Vitamin D in her diet. She had normal labs 2016. Due for labs this yr.   Cologuard: neg 2019; repeat after 3 yrs  Past Medical History:  Diagnosis Date  . Bulging lumbar disc   . History of mammogram 03/04/2015   BIRAD 1  . History of Papanicolaou smear of cervix 02/09/2016   NIL/NEG  . Hypertension   . Pap smear abnormality of cervix/human papillomavirus (HPV) positive 02/05/2015   NIL Pap with positive HPV aptima.  HPV 16,18.45 neg  . Screening for colon cancer 07/2018   neg Cologuard; repeat in 3 yrs    History reviewed. No pertinent surgical history.  Family History  Problem Relation Age of Onset  . Hypertension Father   . Diabetes Paternal Uncle   . Diabetes Paternal Grandfather   . Ovarian cancer Neg Hx   . Breast cancer Neg Hx   . Colon cancer Neg Hx     Social History   Socioeconomic History  . Marital status: Single    Spouse name: Not  on file  . Number of children: Not on file  . Years of education: Not on file  . Highest education level: Not on file  Occupational History  . Not on file  Social Needs  . Financial resource strain: Not on file  . Food insecurity    Worry: Not on file    Inability: Not on file  . Transportation needs    Medical: Not on file    Non-medical: Not on file  Tobacco Use  . Smoking status: Never Smoker  . Smokeless tobacco: Never Used  Substance and Sexual Activity  . Alcohol use: Yes    Alcohol/week: 0.0 standard drinks    Comment: social  . Drug use: No  . Sexual activity: Not Currently    Birth control/protection: Pill  Lifestyle  . Physical activity    Days per week: Not on file    Minutes per session: Not on file  . Stress: Not on file  Relationships  . Social Herbalist on phone: Not on file    Gets together: Not on file    Attends religious service: Not on file    Active member of club or organization: Not on file    Attends meetings of clubs or organizations: Not on file    Relationship status: Not on file  . Intimate partner violence  Fear of current or ex partner: Not on file    Emotionally abused: Not on file    Physically abused: Not on file    Forced sexual activity: Not on file  Other Topics Concern  . Not on file  Social History Narrative  . Not on file     Current Outpatient Medications:  .  CAMILA 0.35 MG tablet, Take 1 tablet (0.35 mg total) by mouth daily., Disp: 84 tablet, Rfl: 3 .  cetirizine (ZYRTEC) 10 MG tablet, Take 1 tablet (10 mg total) by mouth daily. (Patient not taking: Reported on 08/05/2019), Disp: 30 tablet, Rfl: 0 .  HYDROcodone-acetaminophen (NORCO/VICODIN) 5-325 MG tablet, Take 1 tablet by mouth every 4 (four) hours as needed for moderate pain. (Patient not taking: Reported on 08/05/2019), Disp: 10 tablet, Rfl: 0 .  lidocaine (XYLOCAINE) 5 % ointment, Apply 1 application topically as needed. (Patient not taking: Reported on  08/05/2019), Disp: 30 g, Rfl: 1  ROS:  Review of Systems  Constitutional: Negative for fatigue, fever and unexpected weight change.  Respiratory: Negative for cough, shortness of breath and wheezing.   Cardiovascular: Negative for chest pain, palpitations and leg swelling.  Gastrointestinal: Negative for blood in stool, constipation, diarrhea, nausea and vomiting.  Endocrine: Negative for cold intolerance, heat intolerance and polyuria.  Genitourinary: Negative for dyspareunia, dysuria, flank pain, frequency, genital sores, hematuria, menstrual problem, pelvic pain, urgency, vaginal bleeding, vaginal discharge and vaginal pain.  Musculoskeletal: Negative for back pain, joint swelling and myalgias.  Skin: Negative for rash.  Neurological: Negative for dizziness, syncope, light-headedness, numbness and headaches.  Hematological: Negative for adenopathy.  Psychiatric/Behavioral: Negative for agitation, confusion, sleep disturbance and suicidal ideas. The patient is not nervous/anxious.      Objective: BP (!) 140/108   Ht 5\' 4"  (1.626 m)   Wt 224 lb (101.6 kg)   LMP  (LMP Unknown)   BMI 38.45 kg/m    Physical Exam Constitutional:      Appearance: She is well-developed.  Genitourinary:     Vulva, vagina, uterus, right adnexa and left adnexa normal.     No vulval lesion or tenderness noted.     No vaginal discharge, erythema or tenderness.     No cervical motion tenderness or polyp.     Uterus is not enlarged or tender.     No right or left adnexal mass present.     Right adnexa not tender.     Left adnexa not tender.  Neck:     Musculoskeletal: Normal range of motion.     Thyroid: No thyromegaly.  Cardiovascular:     Rate and Rhythm: Normal rate and regular rhythm.     Heart sounds: Normal heart sounds. No murmur.  Pulmonary:     Effort: Pulmonary effort is normal.     Breath sounds: Normal breath sounds.  Chest:     Breasts:        Right: No mass, nipple discharge, skin  change or tenderness.        Left: No mass, nipple discharge, skin change or tenderness.  Abdominal:     Palpations: Abdomen is soft.     Tenderness: There is no abdominal tenderness. There is no guarding.  Musculoskeletal: Normal range of motion.  Neurological:     General: No focal deficit present.     Mental Status: She is alert and oriented to person, place, and time.     Cranial Nerves: No cranial nerve deficit.  Skin:    General:  Skin is warm and dry.  Psychiatric:        Mood and Affect: Mood normal.        Behavior: Behavior normal.        Thought Content: Thought content normal.        Judgment: Judgment normal.  Vitals signs reviewed.     Assessment/Plan: Encounter for annual routine gynecological examination  Encounter for surveillance of contraceptive pills - OCP RF. - Plan: CAMILA 0.35 MG tablet  Blood tests for routine general physical examination - Plan: Comprehensive metabolic panel, Lipid panel, Hemoglobin A1c  Screening cholesterol level - Plan: Lipid panel  Screening for diabetes mellitus - Plan: Hemoglobin A1c  Elevated BP without dx of HTN--rechk at lab appt in 2 days.  Meds ordered this encounter  Medications  . CAMILA 0.35 MG tablet    Sig: Take 1 tablet (0.35 mg total) by mouth daily.    Dispense:  84 tablet    Refill:  3    Order Specific Question:   Supervising Provider    Answer:   Gae Dry [id number]              GYN counsel mammography screening, adequate intake of calcium and vitamin D, diet and exercise     F/U  Return in about 2 days (around 08/07/2019) for lab appt. / 1 yr annual  [place] B. Ovila Lepage, PA-C 08/05/2019 4:31 PM

## 2019-08-07 ENCOUNTER — Other Ambulatory Visit: Payer: Self-pay

## 2019-08-07 ENCOUNTER — Other Ambulatory Visit: Payer: BC Managed Care – PPO

## 2019-08-07 ENCOUNTER — Telehealth: Payer: Self-pay | Admitting: Obstetrics and Gynecology

## 2019-08-07 DIAGNOSIS — Z13220 Encounter for screening for lipoid disorders: Secondary | ICD-10-CM

## 2019-08-07 DIAGNOSIS — I10 Essential (primary) hypertension: Secondary | ICD-10-CM

## 2019-08-07 DIAGNOSIS — Z0000 Encounter for general adult medical examination without abnormal findings: Secondary | ICD-10-CM

## 2019-08-07 DIAGNOSIS — Z131 Encounter for screening for diabetes mellitus: Secondary | ICD-10-CM

## 2019-08-07 MED ORDER — HYDROCHLOROTHIAZIDE 12.5 MG PO CAPS: 12.5000 mg | ORAL_CAPSULE | Freq: Every day | ORAL | 0 refills | Status: DC

## 2019-08-07 NOTE — Telephone Encounter (Signed)
Pt came in this AM for labs and repeat BP chck . BP=150/88. Pt with hx of HTN in past treated with HCTZ 25 mg. Pt lost wt and was able to come off BP meds. Has gained about 33# since last yr due to diet changes and inability to exercise due to torn meniscus. Will restart HCTZ at 12.5 mg dose. RTO in 4 wks for BP check. Keep BP journal at home. Diet/exercise/wt loss changes.

## 2019-08-08 LAB — COMPREHENSIVE METABOLIC PANEL
ALT: 17 IU/L (ref 0–32)
AST: 17 IU/L (ref 0–40)
Albumin/Globulin Ratio: 1.8 (ref 1.2–2.2)
Albumin: 4.4 g/dL (ref 3.8–4.9)
Alkaline Phosphatase: 112 IU/L (ref 39–117)
BUN/Creatinine Ratio: 15 (ref 9–23)
BUN: 12 mg/dL (ref 6–24)
Bilirubin Total: 0.4 mg/dL (ref 0.0–1.2)
CO2: 23 mmol/L (ref 20–29)
Calcium: 9.1 mg/dL (ref 8.7–10.2)
Chloride: 103 mmol/L (ref 96–106)
Creatinine, Ser: 0.78 mg/dL (ref 0.57–1.00)
GFR calc Af Amer: 102 mL/min/{1.73_m2} (ref >59)
GFR calc non Af Amer: 88 mL/min/{1.73_m2} (ref >59)
Globulin, Total: 2.5 g/dL (ref 1.5–4.5)
Glucose: 104 mg/dL — ABNORMAL HIGH (ref 65–99)
Potassium: 4.1 mmol/L (ref 3.5–5.2)
Sodium: 140 mmol/L (ref 134–144)
Total Protein: 6.9 g/dL (ref 6.0–8.5)

## 2019-08-08 LAB — LIPID PANEL
Chol/HDL Ratio: 4 ratio (ref 0.0–4.4)
Cholesterol, Total: 190 mg/dL (ref 100–199)
HDL: 48 mg/dL (ref >39)
LDL Chol Calc (NIH): 126 mg/dL — ABNORMAL HIGH (ref 0–99)
Triglycerides: 89 mg/dL (ref 0–149)
VLDL Cholesterol Cal: 16 mg/dL (ref 5–40)

## 2019-08-08 LAB — HEMOGLOBIN A1C
Est. average glucose Bld gHb Est-mCnc: 117 mg/dL
Hgb A1c MFr Bld: 5.7 % — ABNORMAL HIGH (ref 4.8–5.6)

## 2019-08-29 ENCOUNTER — Other Ambulatory Visit: Payer: Self-pay | Admitting: Obstetrics and Gynecology

## 2019-08-29 DIAGNOSIS — I10 Essential (primary) hypertension: Secondary | ICD-10-CM

## 2019-09-02 DIAGNOSIS — M5387 Other specified dorsopathies, lumbosacral region: Secondary | ICD-10-CM | POA: Diagnosis not present

## 2019-09-02 DIAGNOSIS — M9902 Segmental and somatic dysfunction of thoracic region: Secondary | ICD-10-CM | POA: Diagnosis not present

## 2019-09-02 DIAGNOSIS — M25531 Pain in right wrist: Secondary | ICD-10-CM | POA: Diagnosis not present

## 2019-09-02 DIAGNOSIS — M531 Cervicobrachial syndrome: Secondary | ICD-10-CM | POA: Diagnosis not present

## 2019-09-07 NOTE — Progress Notes (Deleted)
System, Pcp Not In   No chief complaint on file.   HPI:      [name] is a 53 y.o. G0P0000 who LMP was No LMP recorded. (Menstrual status: Irregular Periods)., presents today for BP f/u. Also POP f/u  10/20 BP=150/88. Pt with hx of HTN in past treated with HCTZ 25 mg. Pt lost wt and was able to come off BP meds. Has gained about 33# since last yr due to diet changes and inability to exercise due to torn meniscus. Will restart HCTZ at 12.5 mg dose. RTO in 4 wks for BP check  Patient Active Problem List   Diagnosis Date Noted  . Elevated blood pressure reading without diagnosis of hypertension 08/05/2019  . HTN (hypertension) 11/04/2014    No past surgical history on file.  Family History  Problem Relation Age of Onset  . Hypertension Father   . Diabetes Paternal Uncle   . Diabetes Paternal Grandfather   . Ovarian cancer Neg Hx   . Breast cancer Neg Hx   . Colon cancer Neg Hx     Social History   Socioeconomic History  . Marital status: Single    Spouse name: Not on file  . Number of children: Not on file  . Years of education: Not on file  . Highest education level: Not on file  Occupational History  . Not on file  Social Needs  . Financial resource strain: Not on file  . Food insecurity    Worry: Not on file    Inability: Not on file  . Transportation needs    Medical: Not on file    Non-medical: Not on file  Tobacco Use  . Smoking status: Never Smoker  . Smokeless tobacco: Never Used  Substance and Sexual Activity  . Alcohol use: Yes    Alcohol/week: 0.0 standard drinks    Comment: social  . Drug use: No  . Sexual activity: Not Currently    Birth control/protection: Pill  Lifestyle  . Physical activity    Days per week: Not on file    Minutes per session: Not on file  . Stress: Not on file  Relationships  . Social Herbalist on phone: Not on file    Gets together: Not on file    Attends religious service: Not on file     Active member of club or organization: Not on file    Attends meetings of clubs or organizations: Not on file    Relationship status: Not on file  . Intimate partner violence    Fear of current or ex partner: Not on file    Emotionally abused: Not on file    Physically abused: Not on file    Forced sexual activity: Not on file  Other Topics Concern  . Not on file  Social History Narrative  . Not on file    Outpatient Medications Prior to Visit  Medication Sig Dispense Refill  . CAMILA 0.35 MG tablet Take 1 tablet (0.35 mg total) by mouth daily. 84 tablet 3  . cetirizine (ZYRTEC) 10 MG tablet Take 1 tablet (10 mg total) by mouth daily. (Patient not taking: Reported on 08/05/2019) 30 tablet 0  . hydrochlorothiazide (MICROZIDE) 12.5 MG capsule Take 1 capsule (12.5 mg total) by mouth daily. 30 capsule 0  . HYDROcodone-acetaminophen (NORCO/VICODIN) 5-325 MG tablet Take 1 tablet by mouth every 4 (four) hours as needed for moderate pain. (Patient not taking: Reported on  08/05/2019) 10 tablet 0  . lidocaine (XYLOCAINE) 5 % ointment Apply 1 application topically as needed. (Patient not taking: Reported on 08/05/2019) 30 g 1   No facility-administered medications prior to visit.       ROS:  Review of Systems BREAST: No symptoms   OBJECTIVE:   Vitals:  There were no vitals taken for this visit.  Physical Exam  Results: No results found for this or any previous visit (from the past 24 hour(s)).   Assessment/Plan: No diagnosis found.    No orders of the defined types were placed in this encounter.     No follow-ups on file.   B. , PA-C 09/07/2019 6:29 PM

## 2019-09-08 ENCOUNTER — Ambulatory Visit: Payer: BC Managed Care – PPO | Admitting: Obstetrics and Gynecology

## 2019-09-16 DIAGNOSIS — M79644 Pain in right finger(s): Secondary | ICD-10-CM | POA: Diagnosis not present

## 2019-09-16 DIAGNOSIS — M2392 Unspecified internal derangement of left knee: Secondary | ICD-10-CM | POA: Diagnosis not present

## 2019-09-27 ENCOUNTER — Other Ambulatory Visit: Payer: Self-pay | Admitting: Obstetrics and Gynecology

## 2019-09-27 DIAGNOSIS — I10 Essential (primary) hypertension: Secondary | ICD-10-CM

## 2019-10-08 ENCOUNTER — Ambulatory Visit: Payer: BC Managed Care – PPO | Admitting: Obstetrics and Gynecology

## 2019-10-22 ENCOUNTER — Other Ambulatory Visit: Payer: Self-pay | Admitting: Obstetrics and Gynecology

## 2019-10-22 DIAGNOSIS — I10 Essential (primary) hypertension: Secondary | ICD-10-CM

## 2019-10-28 DIAGNOSIS — I872 Venous insufficiency (chronic) (peripheral): Secondary | ICD-10-CM | POA: Diagnosis not present

## 2019-11-01 DIAGNOSIS — R110 Nausea: Secondary | ICD-10-CM | POA: Diagnosis not present

## 2019-11-01 DIAGNOSIS — I10 Essential (primary) hypertension: Secondary | ICD-10-CM | POA: Diagnosis not present

## 2019-11-01 DIAGNOSIS — L739 Follicular disorder, unspecified: Secondary | ICD-10-CM | POA: Diagnosis not present

## 2019-11-11 ENCOUNTER — Other Ambulatory Visit: Payer: Self-pay | Admitting: Internal Medicine

## 2019-11-11 DIAGNOSIS — N951 Menopausal and female climacteric states: Secondary | ICD-10-CM | POA: Diagnosis not present

## 2019-11-11 DIAGNOSIS — Z79899 Other long term (current) drug therapy: Secondary | ICD-10-CM | POA: Diagnosis not present

## 2019-11-11 DIAGNOSIS — R7303 Prediabetes: Secondary | ICD-10-CM | POA: Diagnosis not present

## 2019-11-11 DIAGNOSIS — Z0000 Encounter for general adult medical examination without abnormal findings: Secondary | ICD-10-CM | POA: Diagnosis not present

## 2019-11-11 DIAGNOSIS — I10 Essential (primary) hypertension: Secondary | ICD-10-CM | POA: Diagnosis not present

## 2019-11-11 DIAGNOSIS — M7989 Other specified soft tissue disorders: Secondary | ICD-10-CM

## 2019-11-11 DIAGNOSIS — E7800 Pure hypercholesterolemia, unspecified: Secondary | ICD-10-CM | POA: Diagnosis not present

## 2019-11-13 DIAGNOSIS — M7989 Other specified soft tissue disorders: Secondary | ICD-10-CM | POA: Diagnosis not present

## 2019-11-13 DIAGNOSIS — I10 Essential (primary) hypertension: Secondary | ICD-10-CM | POA: Diagnosis not present

## 2019-11-14 ENCOUNTER — Other Ambulatory Visit: Payer: Self-pay

## 2019-11-14 ENCOUNTER — Ambulatory Visit
Admission: RE | Admit: 2019-11-14 | Discharge: 2019-11-14 | Disposition: A | Discharge status: Home / Self Care | Payer: BC Managed Care – PPO | Source: Ambulatory Visit | Attending: Internal Medicine | Admitting: Internal Medicine

## 2019-11-14 DIAGNOSIS — M7989 Other specified soft tissue disorders: Secondary | ICD-10-CM | POA: Insufficient documentation

## 2019-11-14 IMAGING — US US EXTREM LOW VENOUS*R*
1 series · 14 of 24 positions shown · non-contrast
Comparison: None.

CLINICAL DATA: Right leg swelling

EXAM:
RIGHT LOWER EXTREMITY VENOUS DOPPLER ULTRASOUND
TECHNIQUE: Gray-scale sonography with compression, as well as color and duplex
ultrasound, were performed to evaluate the deep venous system(s)
from the level of the common femoral vein through the popliteal and
proximal calf veins.

[Series 1: us extrem low venous*right* · 14 of 36 slices shown]
[im 1/36]
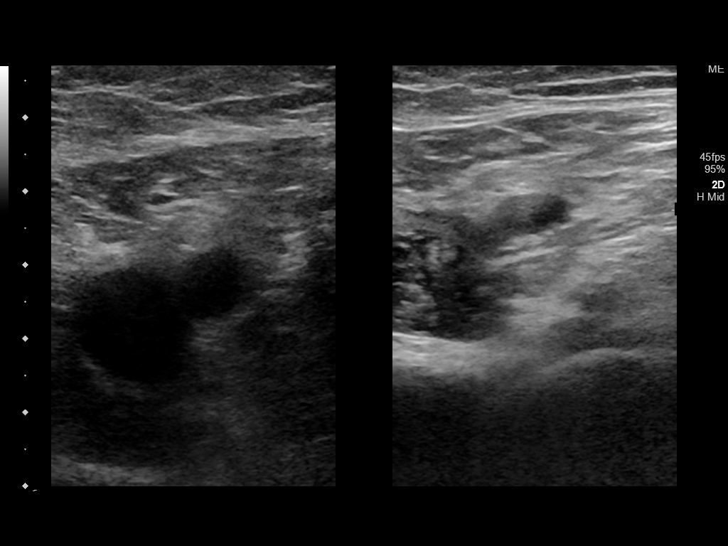
[im 4/36]
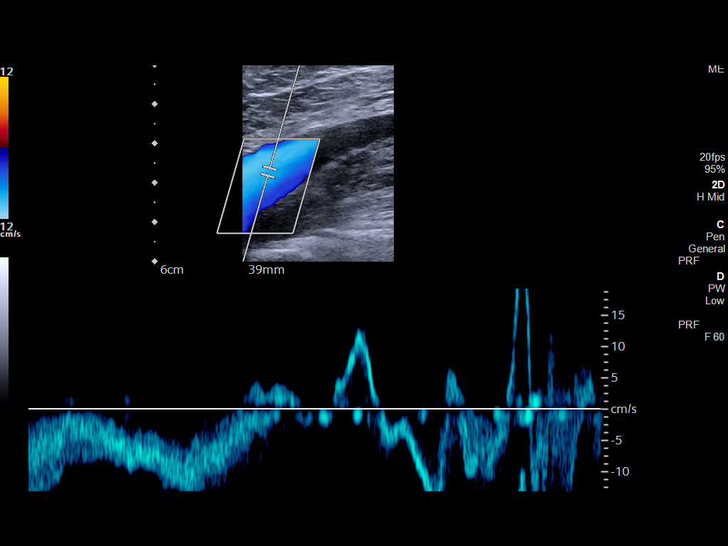
[im 7/36]
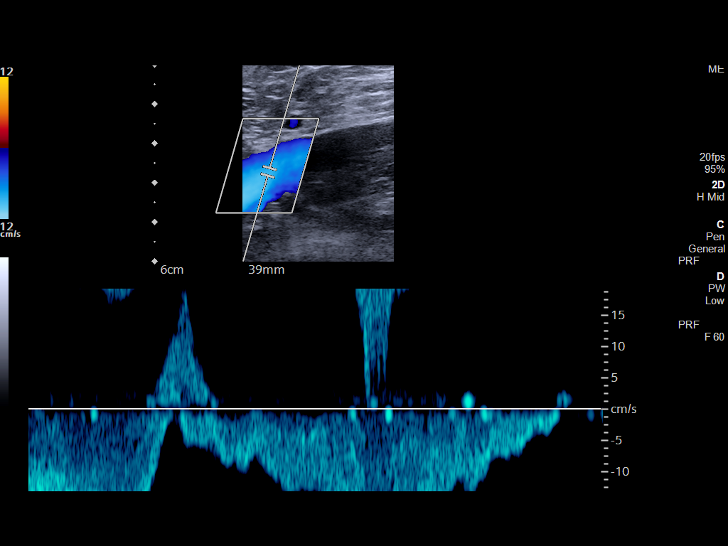
[im 10/36]
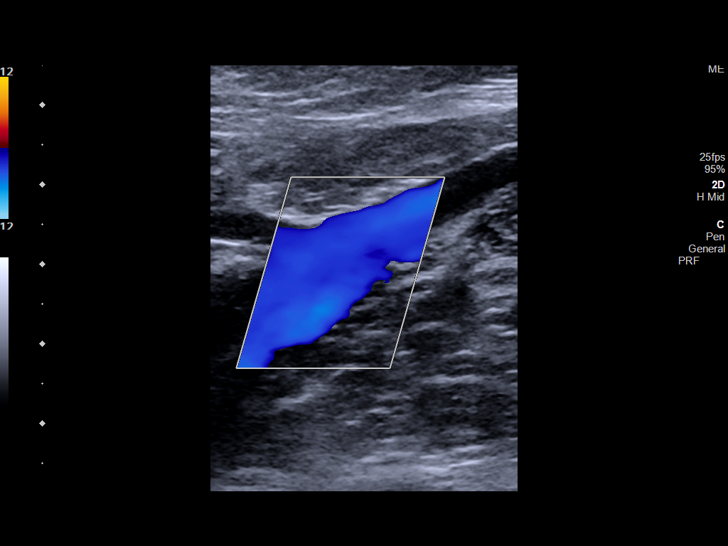
[im 11/36]
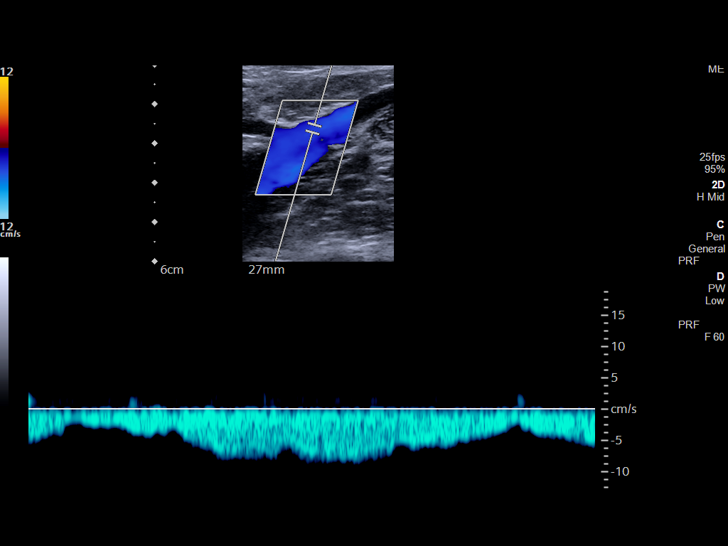
[im 14/36]
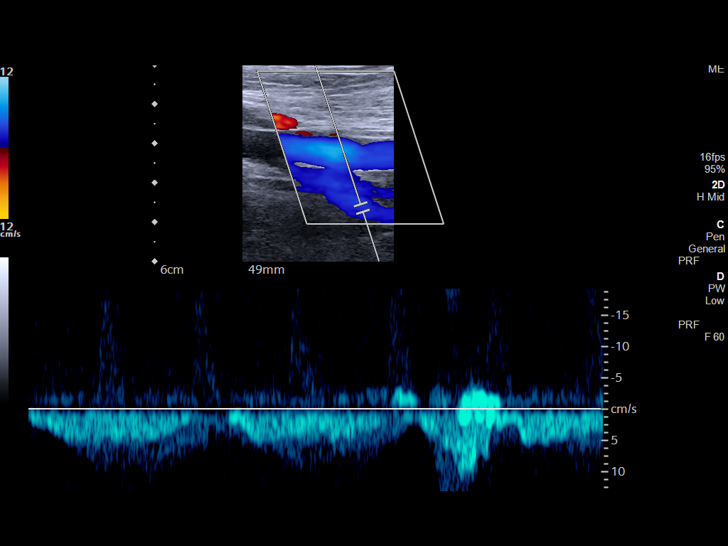
[im 17/36]
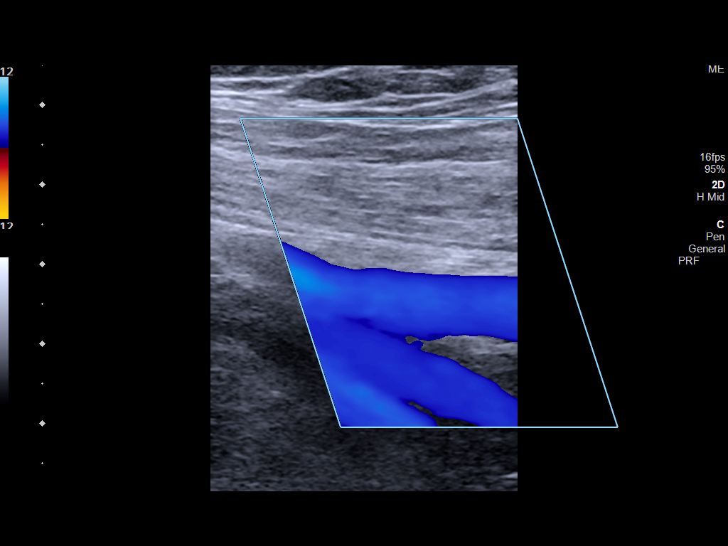
[im 19/36]
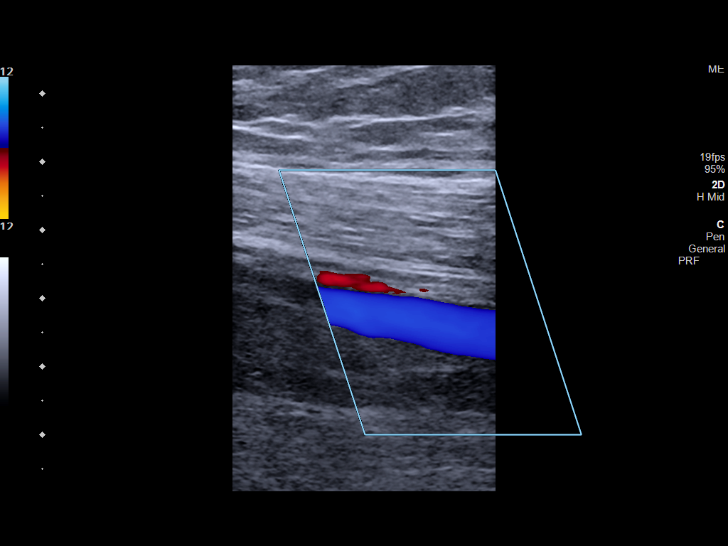
[im 22/36]
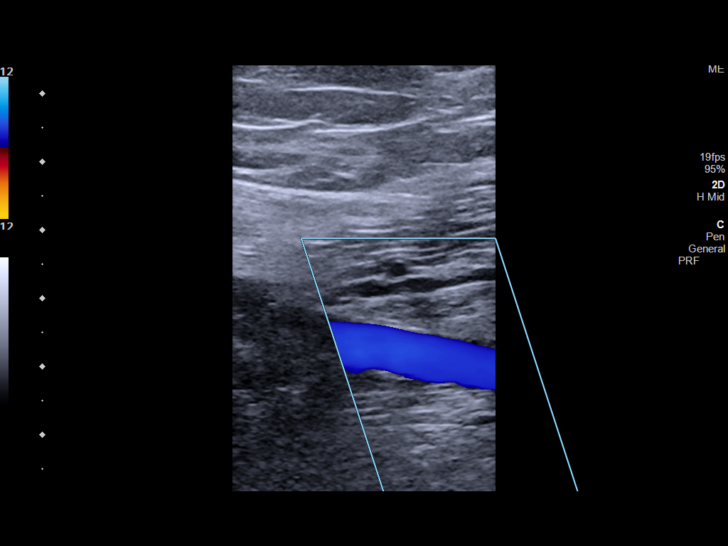
[im 25/36]
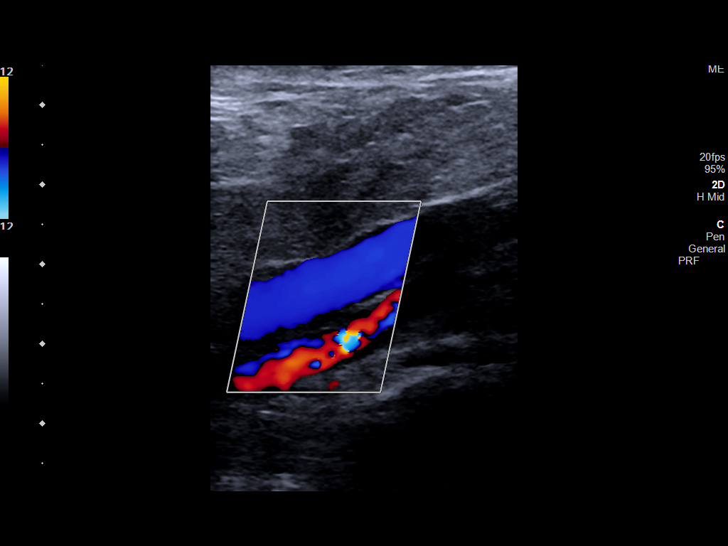
[im 28/36]
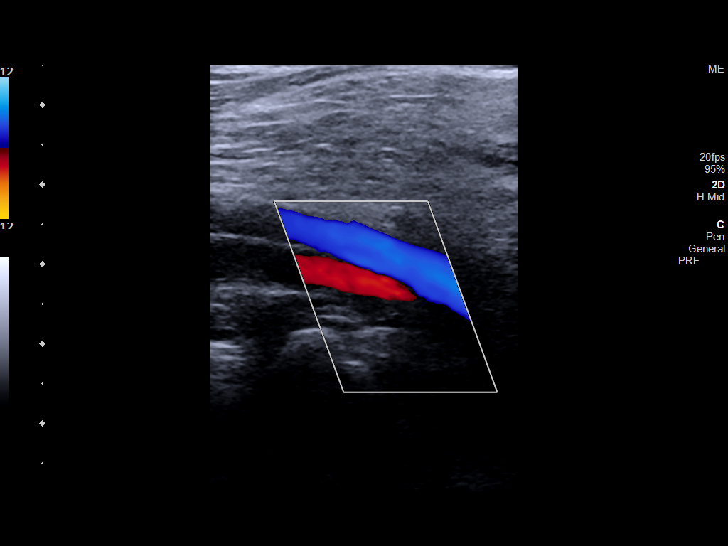
[im 29/36]
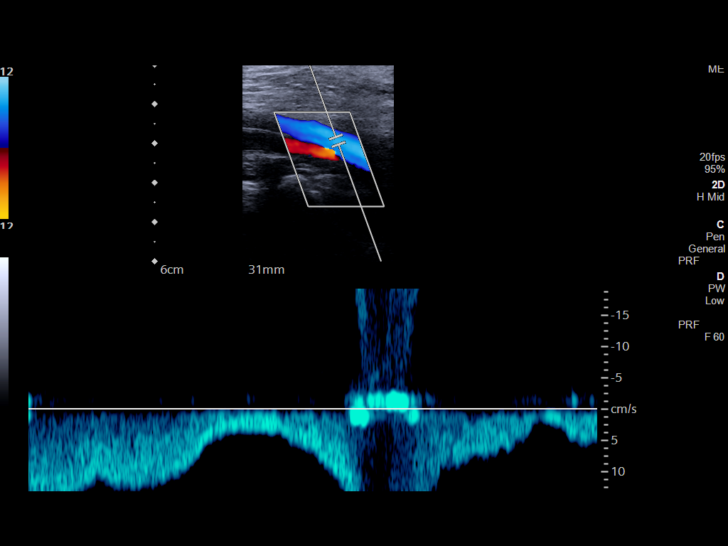
[im 32/36]
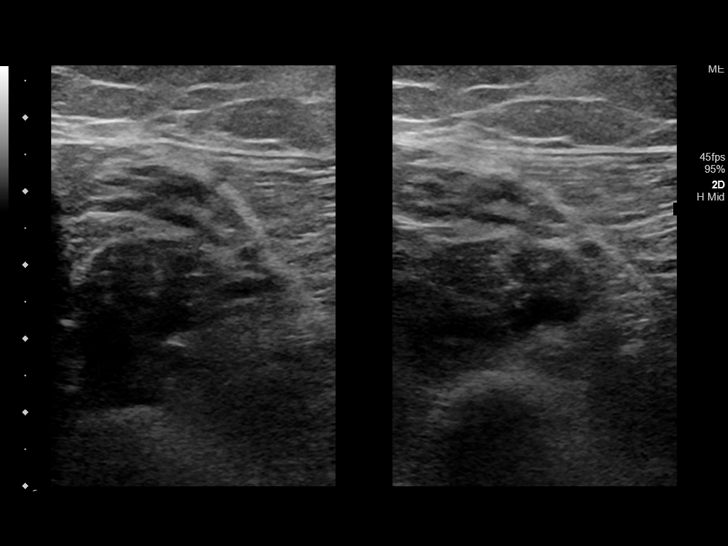
[im 36/36]
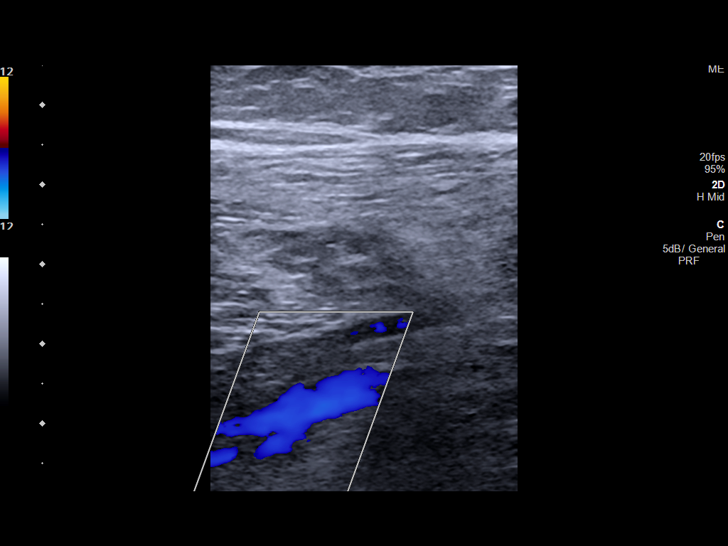

[14 of 24 positions shown; findings below may reference images not displayed]

FINDINGS: VENOUS

Normal compressibility of the common femoral, superficial femoral,
and popliteal veins, as well as the visualized calf veins.
Visualized portions of profunda femoral vein and great saphenous
vein unremarkable. No filling defects to suggest DVT on grayscale or
color Doppler imaging. Doppler waveforms show normal direction of
venous flow, normal respiratory phasicity and response to
augmentation.

Limited views of the contralateral common femoral vein are
unremarkable.

OTHER

None.

Limitations: none
IMPRESSION: No femoropopliteal DVT nor evidence of DVT within the visualized
calf veins.

If clinical symptoms are inconsistent or if there are persistent or
worsening symptoms, further imaging (possibly involving the iliac
veins) may be warranted.

## 2019-11-18 ENCOUNTER — Ambulatory Visit: Payer: BC Managed Care – PPO | Admitting: Obstetrics and Gynecology

## 2019-12-22 DIAGNOSIS — R21 Rash and other nonspecific skin eruption: Secondary | ICD-10-CM | POA: Diagnosis not present

## 2019-12-22 DIAGNOSIS — I10 Essential (primary) hypertension: Secondary | ICD-10-CM | POA: Diagnosis not present

## 2019-12-22 DIAGNOSIS — R7303 Prediabetes: Secondary | ICD-10-CM | POA: Diagnosis not present

## 2020-04-21 ENCOUNTER — Ambulatory Visit: Payer: BC Managed Care – PPO | Admitting: Dermatology

## 2020-05-03 ENCOUNTER — Other Ambulatory Visit: Payer: Self-pay

## 2020-05-03 ENCOUNTER — Ambulatory Visit: Payer: BC Managed Care – PPO | Admitting: Dermatology

## 2020-05-03 DIAGNOSIS — L409 Psoriasis, unspecified: Secondary | ICD-10-CM

## 2020-05-03 DIAGNOSIS — D239 Other benign neoplasm of skin, unspecified: Secondary | ICD-10-CM

## 2020-05-03 DIAGNOSIS — D2371 Other benign neoplasm of skin of right lower limb, including hip: Secondary | ICD-10-CM

## 2020-05-03 DIAGNOSIS — M793 Panniculitis, unspecified: Secondary | ICD-10-CM

## 2020-05-03 DIAGNOSIS — I8311 Varicose veins of right lower extremity with inflammation: Secondary | ICD-10-CM | POA: Diagnosis not present

## 2020-05-03 DIAGNOSIS — R21 Rash and other nonspecific skin eruption: Secondary | ICD-10-CM

## 2020-05-03 MED ORDER — EUCRISA 2 % EX OINT: TOPICAL_OINTMENT | CUTANEOUS | 3 refills | Status: DC

## 2020-05-03 NOTE — Progress Notes (Signed)
   New Patient Visit  Subjective  Krista Blake is a 54 y.o. female who presents for the following: Rash (that started a few months ago on the arms and legs, itchy at times. She did try to [facility] 0.1% cream but didn't find it helpful. Patient was stung by yellow jackets last year and since then her skin has been more sensitive).  The following portions of the chart were reviewed this encounter and updated as appropriate:  Tobacco  Allergies  Meds  Problems  Med Hx  Surg Hx  Fam Hx     Review of Systems:  No other skin or systemic complaints except as noted in HPI or Assessment and Plan.  Objective  Well appearing patient in no apparent distress; mood and affect are within normal limits.  A focused examination was performed including the arms and legs. Relevant physical exam findings are noted in the Assessment and Plan.  Objective  B/L leg: Multiple crusts and excoriations on the lower legs and arms with some spotty dyspigmentation and scarring.   Images              Objective  R med calf: Firm pink/brown papulenodule with dimple sign.   Objective  B/L elbow: Roughness thickness and pinkness of the elbows. Knees are clear. Nails are clear.   Objective  R distal lat pretibial above the ankle: Firm deep papule of the right distal lat pretibial above the ankle 2.0 cm   Assessment & Plan    Rash and other nonspecific skin eruption B/L leg Eczema vs neurodermatitis with atopic neurodermatitis possibly exacerbated by yellow jacket sting = Hypersensitivity Reaction? Start Eucrisa 2% ointment BID. f not improved at follow up appointment recommend biopsy.  Crisaborole (EUCRISA) 2 % OINT - B/L leg  Dermatofibroma R med calf Benign, observe.   Psoriasis vs Atopic Dermatitis B/L elbow No known family history of psorasis - may flare during times of stress and cooler weather  Start Eucrisa 2% ointment BID.   Lipodermatosclerosis of right lower  extremity R distal lat pretibial above the ankle Recent Imaging studies negative for blood clots or other abnormalities  Benign, observe.   Return in about 6 weeks (around 06/14/2020).  Luther Redo, CMA, am acting as scribe for Sarina Ser, MD .  Documentation: I have reviewed the above documentation for accuracy and completeness, and I agree with the above.  Sarina Ser, MD

## 2020-05-04 ENCOUNTER — Other Ambulatory Visit: Payer: Self-pay | Admitting: Obstetrics and Gynecology

## 2020-05-04 ENCOUNTER — Encounter: Payer: Self-pay | Admitting: Dermatology

## 2020-05-04 ENCOUNTER — Other Ambulatory Visit: Payer: Self-pay

## 2020-05-04 DIAGNOSIS — Z3041 Encounter for surveillance of contraceptive pills: Secondary | ICD-10-CM

## 2020-05-04 DIAGNOSIS — M79671 Pain in right foot: Secondary | ICD-10-CM | POA: Diagnosis not present

## 2020-05-04 DIAGNOSIS — R21 Rash and other nonspecific skin eruption: Secondary | ICD-10-CM

## 2020-05-04 MED ORDER — EUCRISA 2 % EX OINT: TOPICAL_OINTMENT | CUTANEOUS | 2 refills | Status: DC

## 2020-05-18 DIAGNOSIS — M722 Plantar fascial fibromatosis: Secondary | ICD-10-CM | POA: Diagnosis not present

## 2020-05-31 DIAGNOSIS — M9901 Segmental and somatic dysfunction of cervical region: Secondary | ICD-10-CM | POA: Diagnosis not present

## 2020-05-31 DIAGNOSIS — M4603 Spinal enthesopathy, cervicothoracic region: Secondary | ICD-10-CM | POA: Diagnosis not present

## 2020-05-31 DIAGNOSIS — M9902 Segmental and somatic dysfunction of thoracic region: Secondary | ICD-10-CM | POA: Diagnosis not present

## 2020-05-31 DIAGNOSIS — M9903 Segmental and somatic dysfunction of lumbar region: Secondary | ICD-10-CM | POA: Diagnosis not present

## 2020-06-08 DIAGNOSIS — M722 Plantar fascial fibromatosis: Secondary | ICD-10-CM | POA: Diagnosis not present

## 2020-06-14 ENCOUNTER — Other Ambulatory Visit: Payer: Self-pay

## 2020-06-14 ENCOUNTER — Other Ambulatory Visit: Payer: Self-pay | Admitting: Dermatology

## 2020-06-14 ENCOUNTER — Ambulatory Visit: Payer: BC Managed Care – PPO | Admitting: Dermatology

## 2020-06-14 DIAGNOSIS — L280 Lichen simplex chronicus: Secondary | ICD-10-CM | POA: Diagnosis not present

## 2020-06-14 DIAGNOSIS — L98499 Non-pressure chronic ulcer of skin of other sites with unspecified severity: Secondary | ICD-10-CM | POA: Diagnosis not present

## 2020-06-14 DIAGNOSIS — D485 Neoplasm of uncertain behavior of skin: Secondary | ICD-10-CM | POA: Diagnosis not present

## 2020-06-14 DIAGNOSIS — L409 Psoriasis, unspecified: Secondary | ICD-10-CM | POA: Diagnosis not present

## 2020-06-14 NOTE — Progress Notes (Signed)
   Follow-Up Visit   Subjective  Krista Blake is a 54 y.o. female who presents for the following: Rash (on the arms and legs - patient unsure if she has noticed an improvement since the last visit. She is still using the Eucrisa 2% ointment but not everyday).  The following portions of the chart were reviewed this encounter and updated as appropriate:  Tobacco  Allergies  Meds  Problems  Med Hx  Surg Hx  Fam Hx     Review of Systems:  No other skin or systemic complaints except as noted in HPI or Assessment and Plan.  Objective  Well appearing patient in no apparent distress; mood and affect are within normal limits.  A focused examination was performed including the arms and legs. Relevant physical exam findings are noted in the Assessment and Plan.  Objective  L elbow: Roughness thickness and pinkness of the elbows. Knees are clear. Nails are clear.   Images      Objective  L thigh: Crusted papules on the arms and legs  Images     Assessment & Plan  Psoriasis versus hypertrophic psoriasis versus lichen simplex versus prurigo nodularis versus other - Not improved on current treatment L elbow  Skin / nail biopsy - L elbow Type of biopsy: punch   Informed consent: discussed and consent obtained   Timeout: patient name, date of birth, surgical site, and procedure verified   Procedure prep:  Patient was prepped and draped in usual sterile fashion (the patient was cleaned and prepped) Prep type:  Isopropyl alcohol Anesthesia: the lesion was anesthetized in a standard fashion   Anesthetic:  1% lidocaine w/ epinephrine 1-100,000 buffered w/ 8.4% NaHCO3 Punch size:  4 mm Suture size:  4-0 Suture type: nylon   Hemostasis achieved with: suture, pressure and aluminum chloride   Outcome: patient tolerated procedure well   Post-procedure details: sterile dressing applied and wound care instructions given   Dressing type: bandage, petrolatum and pressure  dressing    Specimen 1 - Surgical pathology Differential Diagnosis: D48.5 r/o psoriasis vs neurodermatitis vs other Check Margins: No Roughness thickness and pinkness of the elbows. Knees are clear. Nails are clear.  Neurodermatitis/prurigo nodularis versus other- Not improved on current treatment L thigh  Skin / nail biopsy - L thigh Type of biopsy: punch   Informed consent: discussed and consent obtained   Timeout: patient name, date of birth, surgical site, and procedure verified   Procedure prep:  Patient was prepped and draped in usual sterile fashion (the patient was cleaned and prepped) Prep type:  Isopropyl alcohol Anesthesia: the lesion was anesthetized in a standard fashion   Anesthetic:  1% lidocaine w/ epinephrine 1-100,000 buffered w/ 8.4% NaHCO3 Punch size:  4 mm Suture size:  4-0 Suture type: nylon   Hemostasis achieved with: suture, pressure and aluminum chloride   Outcome: patient tolerated procedure well   Post-procedure details: sterile dressing applied and wound care instructions given   Dressing type: bandage, petrolatum and pressure dressing    Specimen 2 - Surgical pathology Differential Diagnosis: D48.5 r/o psoriasis vs neurodermatitis  Check Margins: No  Other Related Medications Pimozide 1 MG TABS  Return in about 1 week (around 06/21/2020).  Luther Redo, CMA, am acting as scribe for Sarina Ser, MD .  Documentation: I have reviewed the above documentation for accuracy and completeness, and I agree with the above.  Sarina Ser, MD

## 2020-06-14 NOTE — Patient Instructions (Signed)

## 2020-06-17 ENCOUNTER — Telehealth: Payer: Self-pay

## 2020-06-17 MED ORDER — MUPIROCIN 2 % EX OINT: 1.0000 "application " | TOPICAL_OINTMENT | Freq: Two times a day (BID) | CUTANEOUS | 0 refills | Status: DC

## 2020-06-17 MED ORDER — DOXYCYCLINE HYCLATE 100 MG PO TABS: 100.0000 mg | ORAL_TABLET | Freq: Two times a day (BID) | ORAL | 0 refills | Status: AC

## 2020-06-17 NOTE — Telephone Encounter (Signed)
Patient called in stating area from punch BX on Monday is now red around the stitches. She does not know if this place is infected but she does not want this to get worse over the weekend.

## 2020-06-17 NOTE — Telephone Encounter (Signed)
Per Dr. Nehemiah Massed send in Doxycycline 100mg  BID with food and water. Also send in Mupirocin BID to the affected areas. Prescriptions sent in and left patient detailed voicemail.

## 2020-06-21 ENCOUNTER — Ambulatory Visit: Payer: BC Managed Care – PPO | Admitting: Dermatology

## 2020-06-21 ENCOUNTER — Other Ambulatory Visit: Payer: Self-pay

## 2020-06-21 DIAGNOSIS — L281 Prurigo nodularis: Secondary | ICD-10-CM | POA: Diagnosis not present

## 2020-06-21 DIAGNOSIS — L280 Lichen simplex chronicus: Secondary | ICD-10-CM

## 2020-06-21 MED ORDER — PIMOZIDE 1 MG PO TABS: ORAL_TABLET | ORAL | 0 refills | Status: DC

## 2020-06-21 NOTE — Progress Notes (Signed)
   Follow-Up Visit   Subjective  Krista Blake is a 54 y.o. female who presents for the following: suture removal - discuss pathology results (patient is here today to discuss pathology results and to have her sutures removed).  The following portions of the chart were reviewed this encounter and updated as appropriate:  Tobacco  Allergies  Meds  Problems  Med Hx  Surg Hx  Fam Hx     Review of Systems:  No other skin or systemic complaints except as noted in HPI or Assessment and Plan.  Objective  Well appearing patient in no apparent distress; mood and affect are within normal limits.  A focused examination was performed including the extremities. Relevant physical exam findings are noted in the Assessment and Plan.  Objective  Trunk, extremities: Crusted papules on the arms and legs.  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   Discussed in detail pathology results and advised patient that condition is difficult to treat and may be exacerbated by stress.   Start Pimozide 1mg  po QHS. Recheck patient in one week and consider increasing gradually not to exceed 6 mg. Patient to call and report condition next week and we will discuss increasing dosage at that time.  Discussed side effects of Pimozide.  She may discontinue if she is having any untoward side effects.  Encounter for Removal of Sutures - Incision site at the left elbow and left thigh is clean, dry and intact - Wound cleansed, sutures removed, wound cleansed and steri strips applied.  - Discussed pathology results showing neurodermatitis with lichen simplex chronicus and prurigo nodularis - Patient advised to keep steri-strips dry until they fall off. - Scars remodel for a full year. - Once steri-strips fall off, patient can apply over-the-counter silicone scar cream each night to help with scar remodeling if desired. -  Patient advised to call with any concerns or if they notice any new or changing lesions.   Pimozide 1 MG TABS - Trunk, extremities  Other treatment options may include Dupixent.  Return for follow up - pending patient phone call next week.  Luther Redo, CMA, am acting as scribe for Sarina Ser, MD .  Documentation: I have reviewed the above documentation for accuracy and completeness, and I agree with the above.  Sarina Ser, MD

## 2020-06-22 ENCOUNTER — Encounter: Payer: Self-pay | Admitting: Dermatology

## 2020-06-22 ENCOUNTER — Other Ambulatory Visit: Payer: Self-pay | Admitting: Obstetrics and Gynecology

## 2020-06-22 DIAGNOSIS — Z3041 Encounter for surveillance of contraceptive pills: Secondary | ICD-10-CM

## 2020-06-25 ENCOUNTER — Encounter: Payer: Self-pay | Admitting: Dermatology

## 2020-06-29 DIAGNOSIS — M722 Plantar fascial fibromatosis: Secondary | ICD-10-CM | POA: Diagnosis not present

## 2020-07-07 ENCOUNTER — Telehealth: Payer: Self-pay

## 2020-07-07 ENCOUNTER — Other Ambulatory Visit: Payer: Self-pay

## 2020-07-07 DIAGNOSIS — L280 Lichen simplex chronicus: Secondary | ICD-10-CM

## 2020-07-07 MED ORDER — PIMOZIDE 1 MG PO TABS: ORAL_TABLET | ORAL | 0 refills | Status: DC

## 2020-07-07 NOTE — Telephone Encounter (Signed)
If no significant side effects, may increase Pimozide 1 mg -- take 2 at bedtime. Call in another week to tell us how doing. Make appt for 2 weeks and we will see how doing.

## 2020-07-07 NOTE — Telephone Encounter (Signed)
Pt called in to update [place] on her neurodermatitis after being on Pimozide 1 mg po qhs for a little over two weeks to discuss increasing dose if needed. She reports some of the brownness has cleared up. She has noticed a few new spots.

## 2020-07-07 NOTE — Telephone Encounter (Signed)
Pt notified of dosage increase. Pt advised to call in one week with update. Appt scheduled. Refill sent to pharmacy to cover increase until appt in 2 weeks.

## 2020-07-12 ENCOUNTER — Other Ambulatory Visit: Payer: Self-pay

## 2020-07-12 DIAGNOSIS — L280 Lichen simplex chronicus: Secondary | ICD-10-CM

## 2020-07-12 MED ORDER — PIMOZIDE 1 MG PO TABS: ORAL_TABLET | ORAL | 0 refills | Status: DC

## 2020-07-12 NOTE — Progress Notes (Signed)
Correcting RX from previous directions.

## 2020-07-19 ENCOUNTER — Other Ambulatory Visit: Payer: Self-pay

## 2020-07-19 ENCOUNTER — Ambulatory Visit: Payer: BC Managed Care – PPO | Admitting: Dermatology

## 2020-07-19 DIAGNOSIS — L280 Lichen simplex chronicus: Secondary | ICD-10-CM

## 2020-07-19 DIAGNOSIS — Z79899 Other long term (current) drug therapy: Secondary | ICD-10-CM

## 2020-07-19 NOTE — Progress Notes (Signed)
   Follow-Up Visit   Subjective  Krista Blake is a 54 y.o. female who presents for the following: neurodermatitis (patient has been using Pimozide 1mg  2 po QHS for a little under two weeks now). Georga Hacking was expensive but she was able to get it from a mail order pharmacy for a much cheaper price. She has not been using the [place] because she thought she was told to stop it during her previous office visit.   The following portions of the chart were reviewed this encounter and updated as appropriate:  Tobacco  Allergies  Meds  Problems  Med Hx  Surg Hx  Fam Hx     Review of Systems:  No other skin or systemic complaints except as noted in HPI or Assessment and Plan.  Objective  Well appearing patient in no apparent distress; mood and affect are within normal limits.  A focused examination was performed including the arms and legs. Relevant physical exam findings are noted in the Assessment and Plan.  Objective  trunk, extremities: Crusted papules on the arms and legs.  Assessment & Plan  Neurodermatitis trunk, extremities With lichen simplex chronicus and prurigo nodularis vs atopic neurodermatitis- pathology proven.  Some improvement evident on Pimozide.  If not getting significant results on continued Pimozide with gradually increasing dosing or if side effect to Pimozide,  consider systemic Dupixent injections.   Continue Pimozide 1mg  2 po QHS for the next week. Then increase dose to 1mg  3 po QHS for 2 weeks.   Restart Eucrisa 2% ointment to aa's BID.   LONG TERM MEDICATION MANAGEMENT  Recheck at follow up appointment. Dupxient pamphlet given.   Other Related Medications Pimozide 1 MG TABS  Return in about 2 weeks (around 08/02/2020).  Luther Redo, CMA, am acting as scribe for Sarina Ser, MD .  Documentation: I have reviewed the above documentation for accuracy and completeness, and I agree with the above.  Sarina Ser, MD

## 2020-07-20 ENCOUNTER — Encounter: Payer: Self-pay | Admitting: Dermatology

## 2020-08-02 ENCOUNTER — Telehealth: Payer: Self-pay

## 2020-08-02 NOTE — Telephone Encounter (Signed)
Please advise pt to stay off medication (Pimozide). If she is improving her side effects after being off, she may wait until follow up appt 10/18. If she feels she needs seen before, then make appt for this week. May start trial of Dupixent shots.

## 2020-08-02 NOTE — Telephone Encounter (Signed)
Pt notified. Advised to call if not feeling better before appt on 08/09/20.

## 2020-08-02 NOTE — Telephone Encounter (Signed)
Pt is calling about her increase in the Pimozide. Pt was crying on the phone. She stated that she hasn't called before today because she is having trouble describing how she is feeling. She states that she has been experiencing extreme fatigue and a feeling of dread when having to get out of bed to do anything. Pt states that she has just felt very emotional and can't get herself to stop crying at work. She states that she did stop the medication. She is unsure of exact day but thinks it was 07/30/20. Pt has f/u scheduled on 08/09/20.

## 2020-08-04 DIAGNOSIS — I10 Essential (primary) hypertension: Secondary | ICD-10-CM | POA: Diagnosis not present

## 2020-08-04 DIAGNOSIS — R7303 Prediabetes: Secondary | ICD-10-CM | POA: Diagnosis not present

## 2020-08-04 DIAGNOSIS — E7800 Pure hypercholesterolemia, unspecified: Secondary | ICD-10-CM | POA: Diagnosis not present

## 2020-08-04 DIAGNOSIS — Z79899 Other long term (current) drug therapy: Secondary | ICD-10-CM | POA: Diagnosis not present

## 2020-08-05 ENCOUNTER — Other Ambulatory Visit: Payer: Self-pay | Admitting: Internal Medicine

## 2020-08-05 DIAGNOSIS — Z1231 Encounter for screening mammogram for malignant neoplasm of breast: Secondary | ICD-10-CM

## 2020-08-09 ENCOUNTER — Other Ambulatory Visit: Payer: Self-pay

## 2020-08-09 ENCOUNTER — Ambulatory Visit: Payer: BC Managed Care – PPO | Admitting: Dermatology

## 2020-08-09 DIAGNOSIS — L280 Lichen simplex chronicus: Secondary | ICD-10-CM | POA: Diagnosis not present

## 2020-08-09 NOTE — Progress Notes (Signed)
   Follow-Up Visit   Subjective  Krista Blake is a 54 y.o. female who presents for the following: neurodermatitis. Patient stopped the Pimozide around 07/30/20 due to extreme fatigue, exhaustion, and uncontrollable crying. Since stopping the medication the patient has noticed she is starting to feel a little better each day. Dr. Doy Hutching recently started the patient on Paxil.   The following portions of the chart were reviewed this encounter and updated as appropriate:  Tobacco  Allergies  Meds  Problems  Med Hx  Surg Hx  Fam Hx     Review of Systems:  No other skin or systemic complaints except as noted in HPI or Assessment and Plan.  Objective  Well appearing patient in no apparent distress; mood and affect are within normal limits.  A focused examination was performed including the arms and legs. Relevant physical exam findings are noted in the Assessment and Plan.  Objective  B/L arms and legs: Crusted papules on the arms and legs.  Assessment & Plan  Neurodermatitis with Excoriations B/L arms and legs With lichen simplex chronicus and prurigo nodularis vs atopic neurodermatitis. Pathology showed Lichen Simplex changes with ulceration suggestive of Factitial dermatitis. If skin condition doesn't improving using Paxil will plan to start Dupixent trial. No known interactions between Paxil and Dupixent.   D/C Pimozide (already done) and stay off this due to side effects.  Return in about 4 weeks (around 09/06/2020) for F/U appt. - start Dupixent trial injections .  Luther Redo, CMA, am acting as scribe for Sarina Ser, MD .  Documentation: I have reviewed the above documentation for accuracy and completeness, and I agree with the above.  Sarina Ser, MD

## 2020-08-10 ENCOUNTER — Encounter: Payer: Self-pay | Admitting: Dermatology

## 2020-09-01 ENCOUNTER — Ambulatory Visit: Payer: BC Managed Care – PPO | Admitting: Dermatology

## 2020-09-01 DIAGNOSIS — E7800 Pure hypercholesterolemia, unspecified: Secondary | ICD-10-CM | POA: Diagnosis not present

## 2020-09-01 DIAGNOSIS — R7303 Prediabetes: Secondary | ICD-10-CM | POA: Diagnosis not present

## 2020-09-01 DIAGNOSIS — I10 Essential (primary) hypertension: Secondary | ICD-10-CM | POA: Diagnosis not present

## 2020-09-01 DIAGNOSIS — K5909 Other constipation: Secondary | ICD-10-CM | POA: Diagnosis not present

## 2020-09-28 DIAGNOSIS — R053 Chronic cough: Secondary | ICD-10-CM | POA: Diagnosis not present

## 2020-09-28 DIAGNOSIS — Z20822 Contact with and (suspected) exposure to covid-19: Secondary | ICD-10-CM | POA: Diagnosis not present

## 2020-09-28 DIAGNOSIS — R5381 Other malaise: Secondary | ICD-10-CM | POA: Diagnosis not present

## 2020-09-28 DIAGNOSIS — R5383 Other fatigue: Secondary | ICD-10-CM | POA: Diagnosis not present

## 2020-11-02 ENCOUNTER — Ambulatory Visit
Admission: RE | Admit: 2020-11-02 | Discharge: 2020-11-02 | Disposition: A | Discharge status: Home / Self Care | Payer: BC Managed Care – PPO | Source: Ambulatory Visit | Attending: Internal Medicine | Admitting: Internal Medicine

## 2020-11-02 ENCOUNTER — Other Ambulatory Visit: Payer: Self-pay

## 2020-11-02 DIAGNOSIS — Z1231 Encounter for screening mammogram for malignant neoplasm of breast: Secondary | ICD-10-CM | POA: Insufficient documentation

## 2020-11-02 IMAGING — MG DIGITAL SCREENING BILAT W/ TOMO W/ CAD
8 series · 8 of 24 positions shown · non-contrast
Comparison: Previous exam(s).

ACR Breast Density Category a: The breast tissue is almost entirely
fatty.

CLINICAL DATA: Screening.

EXAM:
DIGITAL SCREENING BILATERAL MAMMOGRAM WITH TOMO AND CAD

[L MLO synth-2D]
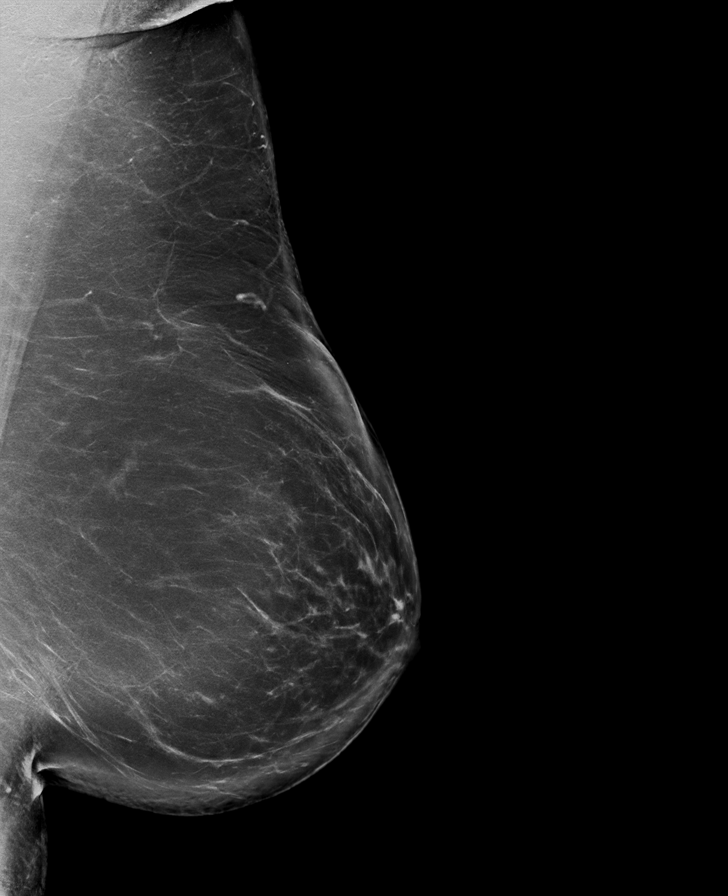

[L CC synth-2D]
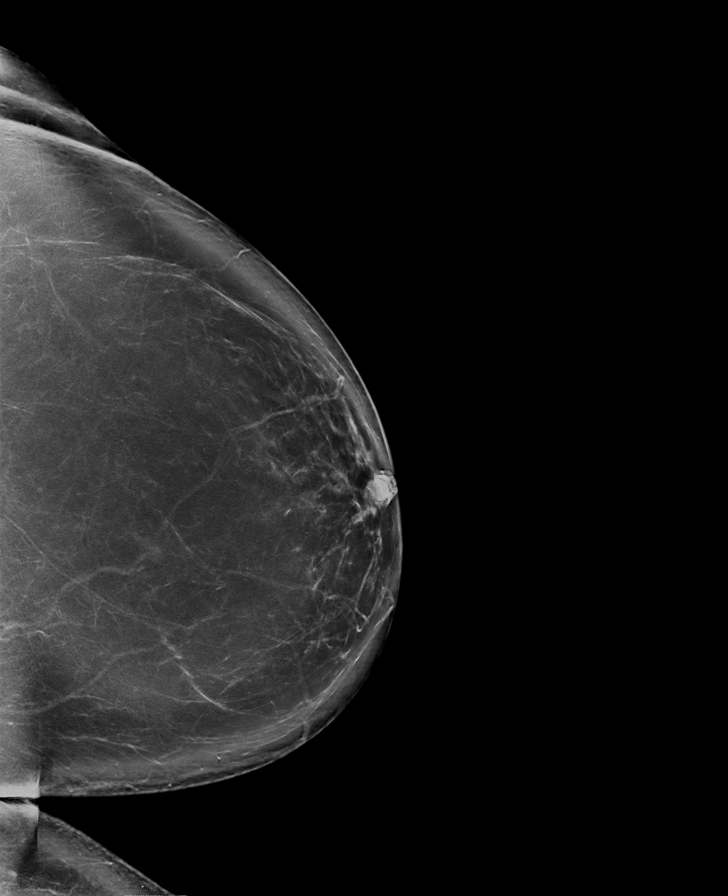

[R MLO synth-2D]
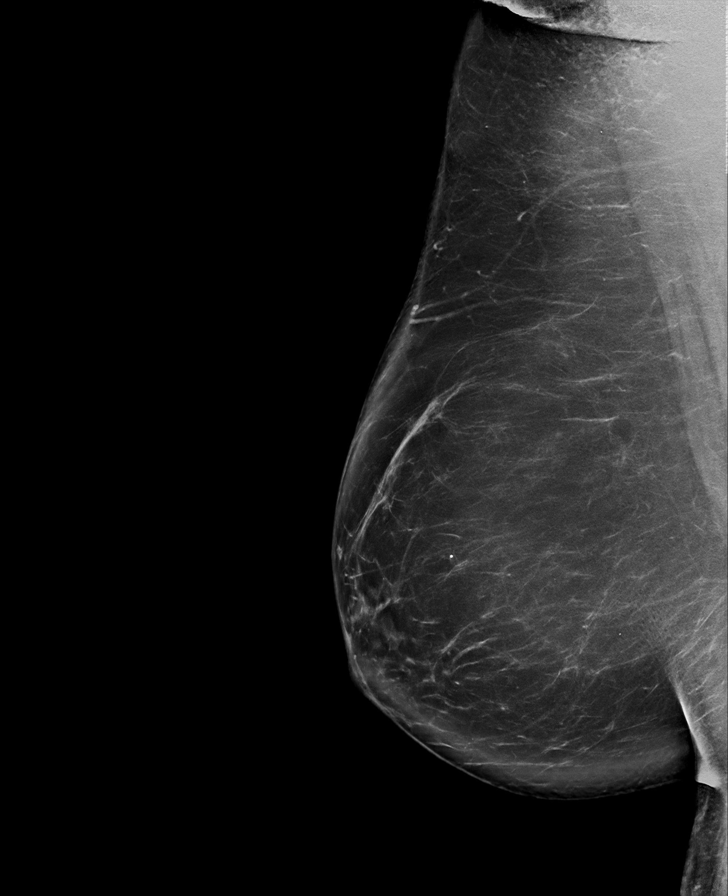

[R CC synth-2D]
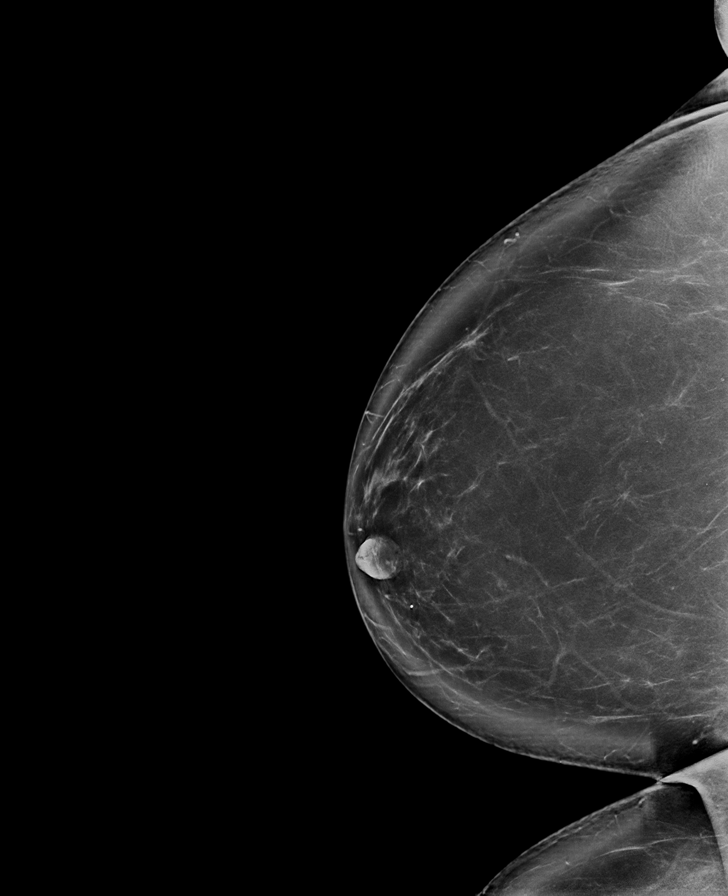

[L MLO tomo · tomo slice 57/114.0]
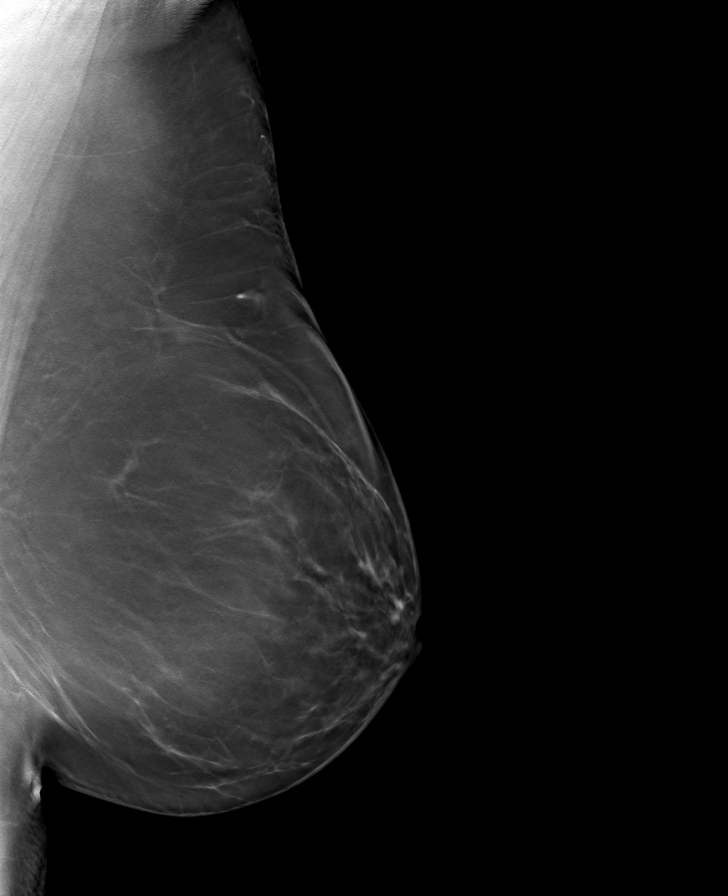

[R MLO tomo · tomo slice 57/113.0]
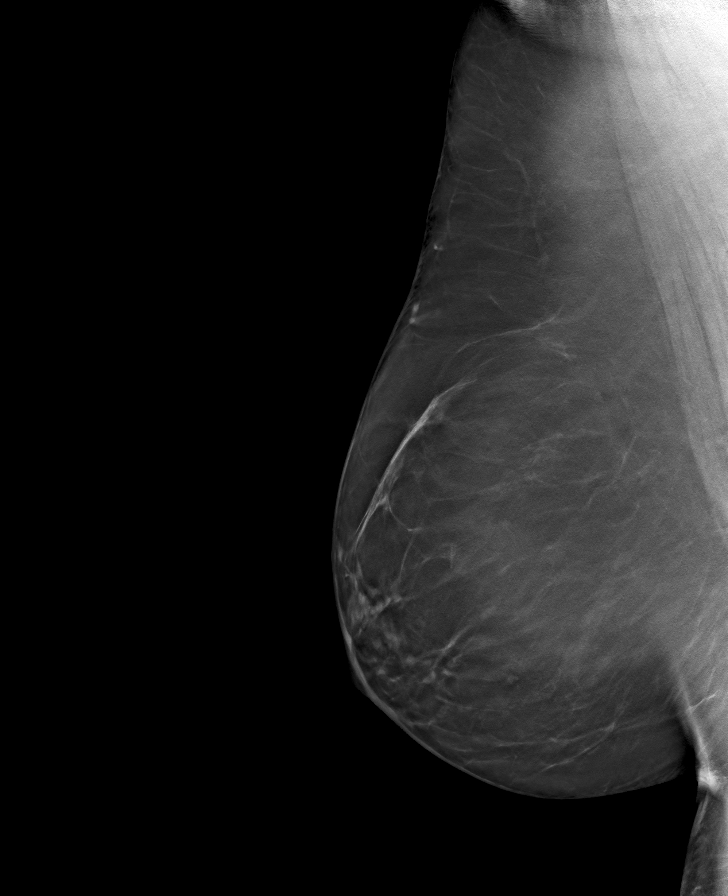

[L CC tomo · tomo slice 53/105.0]
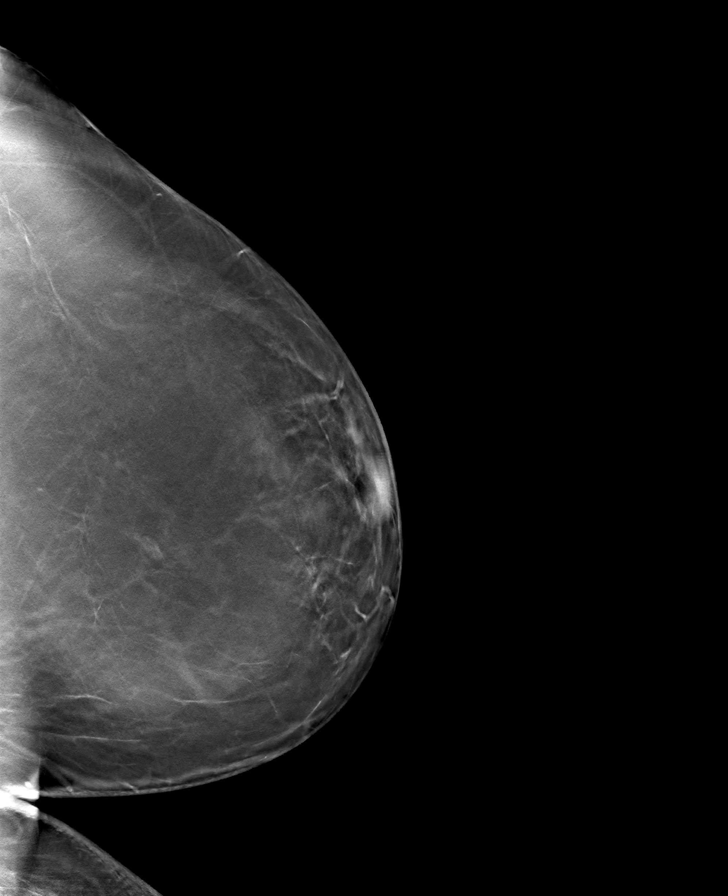

[R CC tomo · tomo slice 53/105.0]
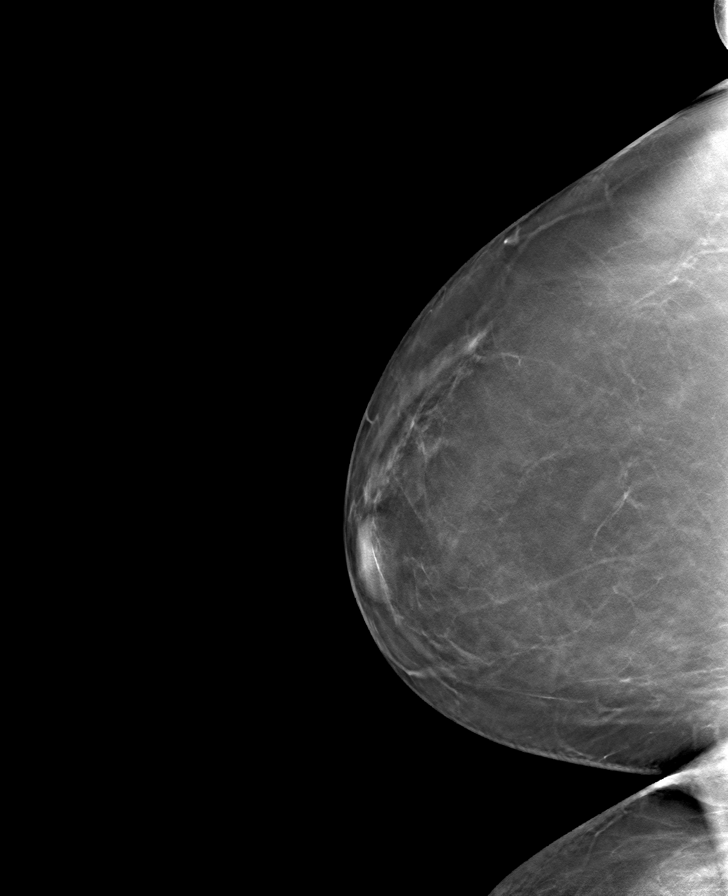

[8 of 24 positions shown; findings below may reference images not displayed]

FINDINGS: There are no findings suspicious for malignancy. Images were
processed with CAD.
IMPRESSION: No mammographic evidence of malignancy. A result letter of this
screening mammogram will be mailed directly to the patient.

RECOMMENDATION:
Screening mammogram in one year. (Code:[TA])

BI-RADS CATEGORY  1: Negative.

## 2020-11-09 ENCOUNTER — Other Ambulatory Visit: Payer: Self-pay

## 2020-11-09 ENCOUNTER — Encounter: Payer: Self-pay | Admitting: Obstetrics and Gynecology

## 2020-11-09 ENCOUNTER — Ambulatory Visit (INDEPENDENT_AMBULATORY_CARE_PROVIDER_SITE_OTHER): Payer: BC Managed Care – PPO | Admitting: Obstetrics and Gynecology

## 2020-11-09 ENCOUNTER — Other Ambulatory Visit (HOSPITAL_COMMUNITY)
Admission: RE | Admit: 2020-11-09 | Discharge: 2020-11-09 | Disposition: A | Discharge status: Home / Self Care | Payer: BC Managed Care – PPO | Source: Ambulatory Visit | Attending: Obstetrics and Gynecology | Admitting: Obstetrics and Gynecology

## 2020-11-09 VITALS — BP 130/70 | Ht 64.0 in | Wt 229.0 lb

## 2020-11-09 DIAGNOSIS — Z124 Encounter for screening for malignant neoplasm of cervix: Secondary | ICD-10-CM | POA: Insufficient documentation

## 2020-11-09 DIAGNOSIS — Z1151 Encounter for screening for human papillomavirus (HPV): Secondary | ICD-10-CM

## 2020-11-09 DIAGNOSIS — N951 Menopausal and female climacteric states: Secondary | ICD-10-CM

## 2020-11-09 DIAGNOSIS — Z1231 Encounter for screening mammogram for malignant neoplasm of breast: Secondary | ICD-10-CM | POA: Diagnosis not present

## 2020-11-09 DIAGNOSIS — R7303 Prediabetes: Secondary | ICD-10-CM | POA: Insufficient documentation

## 2020-11-09 DIAGNOSIS — Z01419 Encounter for gynecological examination (general) (routine) without abnormal findings: Secondary | ICD-10-CM | POA: Diagnosis not present

## 2020-11-09 DIAGNOSIS — Z1211 Encounter for screening for malignant neoplasm of colon: Secondary | ICD-10-CM

## 2020-11-09 NOTE — Progress Notes (Signed)
Chief Complaint  Patient presents with  . Gynecologic Exam    No concerns     HPI:      Krista Blake is a 55 y.o. G0P0000 who LMP was No LMP recorded (lmp unknown). (Menstrual status: Irregular Periods)., presents today for her annual examination.  Her menses are infrequent now. Had a few days of bleeding since last yr's appt. Was on POPs but stopped 10/21 when Rx ran out. No BTB, no dysmen. No vasomotor sx.  Sex activity: not sexually active.  Last Pap: February 09, 2016  Results were: no abnormalities /neg HPV DNA  Hx of STDs: none  Last mammogram: 11/02/20  Results were: normal--routine follow-up in 12 months There is no FH of breast cancer. There is no FH of ovarian cancer. The patient does not do self-breast exams.  Tobacco use: The patient denies current or previous tobacco use. Alcohol use: occas No drug use Exercise: not active due to meniscus tear; has had wt gain due to lack of exercise,   She does get adequate calcium but not Vitamin D in her diet. She had normal labs 2016. Borderline LDL and early pre-DM 10/20. Repeat labs 10/21 with PCP were increased/worse from 10/20. Has HTN, followed by PCP.   Cologuard: neg 05/2018; repeat due after 3 yrs; pt would like to do it again.  Past Medical History:  Diagnosis Date  . Bulging lumbar disc   . History of mammogram 03/04/2015   BIRAD 1  . History of Papanicolaou smear of cervix 02/09/2016   NIL/NEG  . Hypertension   . Pap smear abnormality of cervix/human papillomavirus (HPV) positive 02/05/2015   NIL Pap with positive HPV aptima.  HPV 16,18.45 neg  . Screening for colon cancer 07/2018   neg Cologuard; repeat in 3 yrs    History reviewed. No pertinent surgical history.  Family History  Problem Relation Age of Onset  . Hypertension Father   . Diabetes Paternal Uncle   . Diabetes Paternal Grandfather   . Ovarian cancer Neg Hx   . Breast cancer Neg Hx   . Colon cancer Neg Hx     Social History    Socioeconomic History  . Marital status: Single    Spouse name: Not on file  . Number of children: Not on file  . Years of education: Not on file  . Highest education level: Not on file  Occupational History  . Not on file  Tobacco Use  . Smoking status: Never Smoker  . Smokeless tobacco: Never Used  Vaping Use  . Vaping Use: Never used  Substance and Sexual Activity  . Alcohol use: Yes    Alcohol/week: 0.0 standard drinks    Comment: social  . Drug use: No  . Sexual activity: Not Currently    Birth control/protection: Pill  Other Topics Concern  . Not on file  Social History Narrative  . Not on file   Social Determinants of Health   Financial Resource Strain: Not on file  Food Insecurity: Not on file  Transportation Needs: Not on file  Physical Activity: Not on file  Stress: Not on file  Social Connections: Not on file  Intimate Partner Violence: Not on file     Current Outpatient Medications:  .  cetirizine (ZYRTEC) 10 MG tablet, Take 1 tablet (10 mg total) by mouth daily., Disp: 30 tablet, Rfl: 0 .  Crisaborole (EUCRISA) 2 % OINT, Apply to aa's rash BID, Disp: 60 g, Rfl: 3 .  Crisaborole (EUCRISA) 2 % OINT, Apply to affected areas twice a day, Disp: 60 g, Rfl: 2 .  hydrochlorothiazide (MICROZIDE) 12.5 MG capsule, TAKE 1 CAPSULE BY MOUTH EVERY DAY, Disp: 30 capsule, Rfl: 0 .  HYDROcodone-acetaminophen (NORCO/VICODIN) 5-325 MG tablet, Take 1 tablet by mouth every 4 (four) hours as needed for moderate pain., Disp: 10 tablet, Rfl: 0 .  lidocaine (XYLOCAINE) 5 % ointment, Apply 1 application topically as needed., Disp: 30 g, Rfl: 1 .  PARoxetine (PAXIL) 20 MG tablet, Take by mouth., Disp: , Rfl:  .  Pimozide 1 MG TABS, Take two tablets by mouth at bedtime., Disp: 30 tablet, Rfl: 0 .  propranolol (INDERAL) 40 MG tablet, Take 40 mg by mouth 2 (two) times daily., Disp: , Rfl:  .  rosuvastatin (CRESTOR) 10 MG tablet, Take by mouth., Disp: , Rfl:   ROS:  Review of  Systems  Constitutional: Negative for fatigue, fever and unexpected weight change.  Respiratory: Negative for cough, shortness of breath and wheezing.   Cardiovascular: Negative for chest pain, palpitations and leg swelling.  Gastrointestinal: Negative for blood in stool, constipation, diarrhea, nausea and vomiting.  Endocrine: Negative for cold intolerance, heat intolerance and polyuria.  Genitourinary: Negative for dyspareunia, dysuria, flank pain, frequency, genital sores, hematuria, menstrual problem, pelvic pain, urgency, vaginal bleeding, vaginal discharge and vaginal pain.  Musculoskeletal: Negative for back pain, joint swelling and myalgias.  Skin: Negative for rash.  Neurological: Negative for dizziness, syncope, light-headedness, numbness and headaches.  Hematological: Negative for adenopathy.  Psychiatric/Behavioral: Negative for agitation, confusion, sleep disturbance and suicidal ideas. The patient is not nervous/anxious.      Objective: BP 130/70   Ht 5\' 4"  (1.626 m)   Wt 229 lb (103.9 kg)   LMP  (LMP Unknown)   BMI 39.31 kg/m    Physical Exam Constitutional:      Appearance: She is well-developed.  Genitourinary:     Vulva normal.     Right Labia: No rash, tenderness or lesions.    Left Labia: No tenderness, lesions or rash.    No vaginal discharge, erythema or tenderness.      Right Adnexa: not tender and no mass present.    Left Adnexa: not tender and no mass present.    No cervical motion tenderness, friability or polyp.     Uterus is not enlarged or tender.  Breasts:     Right: No mass, nipple discharge, skin change or tenderness.     Left: No mass, nipple discharge, skin change or tenderness.    Neck:     Thyroid: No thyromegaly.  Cardiovascular:     Rate and Rhythm: Normal rate and regular rhythm.     Heart sounds: Normal heart sounds. No murmur heard.   Pulmonary:     Effort: Pulmonary effort is normal.     Breath sounds: Normal breath sounds.   Abdominal:     Palpations: Abdomen is soft.     Tenderness: There is no abdominal tenderness. There is no guarding or rebound.  Musculoskeletal:        General: Normal range of motion.     Cervical back: Normal range of motion.  Lymphadenopathy:     Cervical: No cervical adenopathy.  Neurological:     General: No focal deficit present.     Mental Status: She is alert and oriented to person, place, and time.     Cranial Nerves: No cranial nerve deficit.  Skin:    General: Skin is warm and  dry.  Psychiatric:        Mood and Affect: Mood normal.        Behavior: Behavior normal.        Thought Content: Thought content normal.        Judgment: Judgment normal.  Vitals reviewed.     Assessment/Plan: Encounter for annual routine gynecological examination  Cervical cancer screening - Plan: Cytology - PAP  Screening for HPV (human papillomavirus) - Plan: Cytology - PAP  Encounter for screening mammogram for malignant neoplasm of breast; pt current on mammo  Perimenopause--rare bleeding on POPs, has already stopped them. Do not restart. F/u prn AUB/menorrhagia for Rx restart.   Screening for colon cancer--colonoscopy/cologuard discussed. Pt elects cologuard again; due 8/22. Will send ref then and f/u with results.            GYN counsel mammography screening, adequate intake of calcium and vitamin D, diet and exercise     F/U  Return in about 1 year (around 11/09/2021). / 1 yr annual  [place] B. Tayllor Breitenstein, PA-C 11/09/2020 4:37 PM

## 2020-11-09 NOTE — Patient Instructions (Signed)
I value your feedback and you entrusting us with your care. If you get a [place] patient survey, I would appreciate you taking the time to let us know about your experience today. Thank you! ? ? ?

## 2020-11-11 LAB — CYTOLOGY - PAP
Comment: NEGATIVE
Diagnosis: NEGATIVE
High risk HPV: NEGATIVE

## 2020-12-06 ENCOUNTER — Ambulatory Visit: Payer: BC Managed Care – PPO | Admitting: Dermatology

## 2021-03-31 ENCOUNTER — Encounter: Payer: Self-pay | Admitting: Obstetrics and Gynecology

## 2021-03-31 NOTE — Telephone Encounter (Signed)
This encounter was created in error - please disregard.

## 2021-04-18 ENCOUNTER — Other Ambulatory Visit
Admission: RE | Admit: 2021-04-18 | Discharge: 2021-04-18 | Disposition: A | Discharge status: Home / Self Care | Payer: BC Managed Care – PPO | Source: Ambulatory Visit | Attending: Student | Admitting: Student

## 2021-04-18 DIAGNOSIS — M25562 Pain in left knee: Secondary | ICD-10-CM | POA: Insufficient documentation

## 2021-04-18 LAB — SYNOVIAL CELL COUNT + DIFF, W/ CRYSTALS
Eosinophils-Synovial: 0 %
Lymphocytes-Synovial Fld: 3 %
Monocyte-Macrophage-Synovial Fluid: 1 %
Neutrophil, Synovial: 96 %
WBC, Synovial: 55943 /mm3 — ABNORMAL HIGH (ref 0–200)

## 2021-05-02 ENCOUNTER — Other Ambulatory Visit: Payer: Self-pay | Admitting: Surgery

## 2021-05-04 ENCOUNTER — Encounter: Payer: Self-pay | Admitting: Surgery

## 2021-05-04 ENCOUNTER — Ambulatory Visit: Payer: BC Managed Care – PPO | Admitting: Certified Registered Nurse Anesthetist

## 2021-05-04 ENCOUNTER — Ambulatory Visit
Admission: RE | Admit: 2021-05-04 | Discharge: 2021-05-04 | Disposition: A | Discharge status: Home / Self Care | Payer: BC Managed Care – PPO | Attending: Surgery | Admitting: Surgery

## 2021-05-04 ENCOUNTER — Other Ambulatory Visit: Payer: Self-pay

## 2021-05-04 ENCOUNTER — Encounter
Admission: RE | Disposition: A | Discharge status: Home / Self Care | Payer: Self-pay | Source: Home / Self Care | Attending: Surgery

## 2021-05-04 DIAGNOSIS — M109 Gout, unspecified: Secondary | ICD-10-CM | POA: Diagnosis not present

## 2021-05-04 DIAGNOSIS — I10 Essential (primary) hypertension: Secondary | ICD-10-CM | POA: Insufficient documentation

## 2021-05-04 DIAGNOSIS — S83282A Other tear of lateral meniscus, current injury, left knee, initial encounter: Secondary | ICD-10-CM | POA: Diagnosis present

## 2021-05-04 DIAGNOSIS — Z7952 Long term (current) use of systemic steroids: Secondary | ICD-10-CM | POA: Diagnosis not present

## 2021-05-04 DIAGNOSIS — Z79899 Other long term (current) drug therapy: Secondary | ICD-10-CM | POA: Insufficient documentation

## 2021-05-04 DIAGNOSIS — X58XXXA Exposure to other specified factors, initial encounter: Secondary | ICD-10-CM | POA: Insufficient documentation

## 2021-05-04 DIAGNOSIS — M94262 Chondromalacia, left knee: Secondary | ICD-10-CM | POA: Insufficient documentation

## 2021-05-04 DIAGNOSIS — S83232A Complex tear of medial meniscus, current injury, left knee, initial encounter: Secondary | ICD-10-CM | POA: Insufficient documentation

## 2021-05-04 DIAGNOSIS — Z8249 Family history of ischemic heart disease and other diseases of the circulatory system: Secondary | ICD-10-CM | POA: Insufficient documentation

## 2021-05-04 DIAGNOSIS — Z880 Allergy status to penicillin: Secondary | ICD-10-CM | POA: Diagnosis not present

## 2021-05-04 DIAGNOSIS — Y939 Activity, unspecified: Secondary | ICD-10-CM | POA: Insufficient documentation

## 2021-05-04 DIAGNOSIS — M1712 Unilateral primary osteoarthritis, left knee: Secondary | ICD-10-CM | POA: Diagnosis not present

## 2021-05-04 DIAGNOSIS — M25462 Effusion, left knee: Secondary | ICD-10-CM | POA: Diagnosis not present

## 2021-05-04 HISTORY — PX: I & D EXTREMITY: SHX5045

## 2021-05-04 LAB — SYNOVIAL CELL COUNT + DIFF, W/ CRYSTALS
Crystals, Fluid: NONE SEEN
Eosinophils-Synovial: 0 %
Lymphocytes-Synovial Fld: 1 %
Monocyte-Macrophage-Synovial Fluid: 0 %
Neutrophil, Synovial: 99 %
WBC, Synovial: 16099 /mm3 — ABNORMAL HIGH (ref 0–200)

## 2021-05-04 SURGERY — IRRIGATION AND DEBRIDEMENT EXTREMITY
Anesthesia: General | Site: Knee | Laterality: Left

## 2021-05-04 MED ORDER — GLYCOPYRROLATE 0.2 MG/ML IJ SOLN
INTRAMUSCULAR | Status: DC | PRN
  Administered 2021-05-04: .2 mg via INTRAVENOUS

## 2021-05-04 MED ORDER — FENTANYL CITRATE (PF) 100 MCG/2ML IJ SOLN
INTRAMUSCULAR | Status: DC | PRN
  Administered 2021-05-04 (×2): 25 ug via INTRAVENOUS
  Administered 2021-05-04: 50 ug via INTRAVENOUS

## 2021-05-04 MED ORDER — FENTANYL CITRATE (PF) 100 MCG/2ML IJ SOLN: 25.0000 ug | INTRAMUSCULAR | Status: DC | PRN

## 2021-05-04 MED ORDER — MIDAZOLAM HCL 2 MG/2ML IJ SOLN
INTRAMUSCULAR | Status: AC
  Filled 2021-05-04: qty 2

## 2021-05-04 MED ORDER — DEXAMETHASONE SODIUM PHOSPHATE 10 MG/ML IJ SOLN
INTRAMUSCULAR | Status: DC | PRN
  Administered 2021-05-04: 10 mg via INTRAVENOUS

## 2021-05-04 MED ORDER — CLINDAMYCIN PHOSPHATE 900 MG/50ML IV SOLN
INTRAVENOUS | Status: AC
  Filled 2021-05-04: qty 50

## 2021-05-04 MED ORDER — LIDOCAINE HCL (PF) 1 % IJ SOLN
INTRAMUSCULAR | Status: AC
  Filled 2021-05-04: qty 30

## 2021-05-04 MED ORDER — LACTATED RINGERS IV SOLN: INTRAVENOUS | Status: DC

## 2021-05-04 MED ORDER — CHLORHEXIDINE GLUCONATE 0.12 % MT SOLN
OROMUCOSAL | Status: AC
  Administered 2021-05-04: 15 mL via OROMUCOSAL
  Filled 2021-05-04: qty 15

## 2021-05-04 MED ORDER — ORAL CARE MOUTH RINSE: 15.0000 mL | Freq: Once | OROMUCOSAL | Status: AC

## 2021-05-04 MED ORDER — ONDANSETRON HCL 4 MG/2ML IJ SOLN
INTRAMUSCULAR | Status: DC | PRN
  Administered 2021-05-04: 4 mg via INTRAVENOUS

## 2021-05-04 MED ORDER — CLINDAMYCIN PHOSPHATE 900 MG/50ML IV SOLN
900.0000 mg | INTRAVENOUS | Status: AC
  Administered 2021-05-04: 900 mg via INTRAVENOUS

## 2021-05-04 MED ORDER — MIDAZOLAM HCL 2 MG/2ML IJ SOLN
INTRAMUSCULAR | Status: DC | PRN
  Administered 2021-05-04: 2 mg via INTRAVENOUS

## 2021-05-04 MED ORDER — OXYCODONE HCL 5 MG PO TABS: 5.0000 mg | ORAL_TABLET | Freq: Once | ORAL | Status: DC | PRN

## 2021-05-04 MED ORDER — CHLORHEXIDINE GLUCONATE 0.12 % MT SOLN: 15.0000 mL | Freq: Once | OROMUCOSAL | Status: AC

## 2021-05-04 MED ORDER — LACTATED RINGERS IV SOLN: INTRAVENOUS | Status: DC | PRN

## 2021-05-04 MED ORDER — FENTANYL CITRATE (PF) 100 MCG/2ML IJ SOLN
INTRAMUSCULAR | Status: AC
  Filled 2021-05-04: qty 2

## 2021-05-04 MED ORDER — HYDROCODONE-ACETAMINOPHEN 5-325 MG PO TABS: 1.0000 | ORAL_TABLET | ORAL | 0 refills | Status: DC | PRN

## 2021-05-04 MED ORDER — BUPIVACAINE-EPINEPHRINE (PF) 0.5% -1:200000 IJ SOLN
INTRAMUSCULAR | Status: DC | PRN
  Administered 2021-05-04 (×2): 30 mL via PERINEURAL

## 2021-05-04 MED ORDER — PROPOFOL 10 MG/ML IV BOLUS
INTRAVENOUS | Status: AC
  Filled 2021-05-04: qty 20

## 2021-05-04 MED ORDER — KETOROLAC TROMETHAMINE 30 MG/ML IJ SOLN
INTRAMUSCULAR | Status: DC | PRN
  Administered 2021-05-04: 30 mg via INTRAVENOUS

## 2021-05-04 MED ORDER — ACETAMINOPHEN 10 MG/ML IV SOLN
INTRAVENOUS | Status: DC | PRN
  Administered 2021-05-04: 1000 mg via INTRAVENOUS

## 2021-05-04 MED ORDER — PROPOFOL 10 MG/ML IV BOLUS
INTRAVENOUS | Status: DC | PRN
  Administered 2021-05-04: 200 mg via INTRAVENOUS

## 2021-05-04 MED ORDER — PHENYLEPHRINE HCL (PRESSORS) 10 MG/ML IV SOLN
INTRAVENOUS | Status: DC | PRN
  Administered 2021-05-04 (×2): 100 ug via INTRAVENOUS

## 2021-05-04 MED ORDER — BUPIVACAINE-EPINEPHRINE (PF) 0.5% -1:200000 IJ SOLN
INTRAMUSCULAR | Status: AC
  Filled 2021-05-04: qty 30

## 2021-05-04 MED ORDER — LIDOCAINE HCL 1 % IJ SOLN
INTRAMUSCULAR | Status: DC | PRN
  Administered 2021-05-04: 30 mL

## 2021-05-04 MED ORDER — LIDOCAINE HCL (CARDIAC) PF 100 MG/5ML IV SOSY
PREFILLED_SYRINGE | INTRAVENOUS | Status: DC | PRN
  Administered 2021-05-04: 100 mg via INTRAVENOUS

## 2021-05-04 MED ORDER — OXYCODONE HCL 5 MG/5ML PO SOLN: 5.0000 mg | Freq: Once | ORAL | Status: DC | PRN

## 2021-05-04 SURGICAL SUPPLY — 38 items
"PENCIL ELECTRO HAND CTR " (MISCELLANEOUS) ×1 IMPLANT
APL PRP STRL LF DISP 70% ISPRP (MISCELLANEOUS) ×1
BLADE FULL RADIUS 3.5 (BLADE) ×2 IMPLANT
BNDG ELASTIC 6X5.8 VLCR STR LF (GAUZE/BANDAGES/DRESSINGS) ×2 IMPLANT
BNDG ESMARK 6X12 TAN STRL LF (GAUZE/BANDAGES/DRESSINGS) ×2 IMPLANT
CHLORAPREP W/TINT 26 (MISCELLANEOUS) ×2 IMPLANT
CNTNR SPEC 2.5X3XGRAD LEK (MISCELLANEOUS) ×2
CONT SPEC 4OZ STER OR WHT (MISCELLANEOUS) ×2
CONT SPEC 4OZ STRL OR WHT (MISCELLANEOUS) ×2
CONTAINER SPEC 2.5X3XGRAD LEK (MISCELLANEOUS) IMPLANT
CUFF TOURN SGL QUICK 34 (TOURNIQUET CUFF)
CUFF TRNQT CYL 34X4.125X (TOURNIQUET CUFF) IMPLANT
DRAPE IMP U-DRAPE 54X76 (DRAPES) ×2 IMPLANT
ELECT REM PT RETURN 9FT ADLT (ELECTROSURGICAL) ×2
ELECTRODE REM PT RTRN 9FT ADLT (ELECTROSURGICAL) ×1 IMPLANT
GAUZE SPONGE 4X4 12PLY STRL (GAUZE/BANDAGES/DRESSINGS) ×2 IMPLANT
GLOVE SURG ENC MOIS LTX SZ8 (GLOVE) ×4 IMPLANT
GLOVE SURG ENC TEXT LTX SZ7 (GLOVE) ×4 IMPLANT
GLOVE SURG UNDER LTX SZ8 (GLOVE) ×2 IMPLANT
GLOVE SURG UNDER POLY LF SZ7.5 (GLOVE) ×2 IMPLANT
GOWN STRL REUS W/ TWL LRG LVL3 (GOWN DISPOSABLE) ×1 IMPLANT
GOWN STRL REUS W/ TWL XL LVL3 (GOWN DISPOSABLE) ×2 IMPLANT
GOWN STRL REUS W/TWL LRG LVL3 (GOWN DISPOSABLE) ×2
GOWN STRL REUS W/TWL XL LVL3 (GOWN DISPOSABLE) ×4
IV LACTATED RINGER IRRG 3000ML (IV SOLUTION) ×2
IV LR IRRIG 3000ML ARTHROMATIC (IV SOLUTION) ×1 IMPLANT
KIT TURNOVER KIT A (KITS) ×2 IMPLANT
MANIFOLD NEPTUNE II (INSTRUMENTS) ×4 IMPLANT
NDL HYPO 21X1.5 SAFETY (NEEDLE) ×1 IMPLANT
NEEDLE HYPO 21X1.5 SAFETY (NEEDLE) ×2 IMPLANT
PACK ARTHROSCOPY KNEE (MISCELLANEOUS) ×2 IMPLANT
PENCIL ELECTRO HAND CTR (MISCELLANEOUS) ×2 IMPLANT
SPONGE T-LAP 18X18 ~~LOC~~+RFID (SPONGE) ×2 IMPLANT
SUT PROLENE 4 0 PS 2 18 (SUTURE) ×2 IMPLANT
SUT TICRON COATED BLUE 2 0 30 (SUTURE) IMPLANT
SYR 50ML LL SCALE MARK (SYRINGE) ×2 IMPLANT
TUBING INFLOW SET DBFLO PUMP (TUBING) ×2 IMPLANT
WAND WEREWOLF FLOW 90D (MISCELLANEOUS) ×2 IMPLANT

## 2021-05-04 NOTE — H&P (Signed)
History of Present Illness: Krista Blake is a 55 y.o. female who presents today for repeat evaluation of ongoing left knee pain. The patient was evaluated on 04/18/2021. Before this the patient began to experience a significant, sharp discomfort in her left knee, this pain occurred without any trauma or injury. The patient was evaluated by a orthopedic urgent care and underwent a left knee steroid injection which did not provide long-lasting relief, it did provide significant relief for 2 days. The patient was evaluated on 04/18/2021, at this appointment the patient had a moderate left knee effusion which was aspirated. Aspiration was sent for cell count, crystal analysis, gram stain and culture. Initial cell count returned demonstrating 50,000 white cells, it was also positive for urate sodium crystals. After this returned the patient was started on colchicine in addition to indomethacin, she was given a low-dose prednisone taper but was then switched to a higher prednisone taper dose. Gram stain was negative for bacterial growth. However her culture did demonstrate staph aureus. Because of this she was brought back into the office on 04/26/2021 where she was reevaluated. The patient was evaluated with Dr. Marry Guan, at this appointment the patient did still have effusion to the left knee however she had no significant warmth to palpation and no erythema was noted to the left knee. Because of this it was felt that the culture may have been contaminated and the patient was treated for continued gout flare. Discussed care with rheumatology who encouraged increasing her colchicine and starting allopurinol. Patient does state that she did experience some relief with allopurinol dose. However, the patient was reevaluated at the urgent care this weekend where she stated that the pain that she was having left knee was significantly worse on that particular day. She still denies any fevers or chills at home. She does  report moderate swelling and ranks her pain as a 5 out of 10 at today's visit. She denies any previous surgical history left knee. No history of gout in the past. The patient denies any personal history of heart attack, stroke, asthma or COPD. No personal history of blood clots  Past Medical History:  Chicken pox   HTN (hypertension)   Past Surgical History: History reviewed. No pertinent surgical history.  Past Family History:  High blood pressure (Hypertension) Father   High blood pressure (Hypertension) Paternal Grandfather   No Known Problems Mother   No Known Problems Sister   No Known Problems Brother   Medications:  allopurinoL (ZYLOPRIM) 100 MG tablet Take 1 tablet (100 mg total) by mouth once daily 30 tablet 0   colchicine (COLCRYS) 0.6 mg tablet Take 1 tablet (0.6 mg total) by mouth once daily 30 tablet 0   hydroCHLOROthiazide (HYDRODIURIL) 25 MG tablet TAKE 1 TABLET BY MOUTH EVERY DAY 90 tablet 3   HYDROcodone-acetaminophen (NORCO) 5-325 mg tablet Take 1 tablet by mouth every 6 (six) hours as needed for Pain for up to 20 doses 20 tablet 0   indomethacin (INDOCIN) 50 MG capsule Take 1 capsule (50 mg total) by mouth 2 (two) times daily with meals for 90 days 60 capsule 2   PARoxetine (PAXIL) 20 MG tablet Take 1 tablet (20 mg total) by mouth once daily 90 tablet 1   phentermine (ADIPEX-P) 37.5 mg tablet TAKE 1 TABLET BY MOUTH EVERY MORNING BEFORE BREAKFAST FOR 30 DAYS.   predniSONE (DELTASONE) 20 MG tablet Take 1 tablet (20 mg total) by mouth 2 (two) times daily for 5 days 10 tablet  0   propranoloL (INDERAL) 40 MG tablet Take 1 tablet (40 mg total) by mouth 2 (two) times daily 180 tablet 1   rosuvastatin (CRESTOR) 10 MG tablet Take 1 tablet (10 mg total) by mouth once daily 30 tablet 11   sulfamethoxazole-trimethoprim (BACTRIM DS) 800-160 mg tablet Take 1 tablet (160 mg of trimethoprim total) by mouth 2 (two) times daily for 14 days 28 tablet 0   traMADoL (ULTRAM) 50 mg tablet  tramadol 50 mg tablet TAKE 1 TABLET (50 MG TOTAL) BY MOUTH EVERY 8 (EIGHT) HOURS AS NEEDED FOR UP TO 5 DAYS (Patient not taking: Reported on 05/02/2021)   Allergies:  Penicillins Hives   Review of Systems:  A comprehensive 14 point ROS was performed, reviewed by me today, and the pertinent orthopaedic findings are documented in the HPI.  Physical Exam: BP 126/80  Ht 162.6 cm (5\' 4" )  Wt (!) 105.7 kg (233 lb)  LMP (LMP Unknown)  BMI 39.99 kg/m  General/Constitutional: The patient appears to be well-nourished, well-developed, and in no acute distress. Neuro/Psych: Normal mood and affect, oriented to person, place and time. Eyes: Non-icteric. Pupils are equal, round, and reactive to light, and exhibit synchronous movement. ENT: Unremarkable. Lymphatic: No palpable adenopathy. Respiratory: Lungs clear to auscultation, Normal chest excursion, No wheezes and Non-labored breathing Cardiovascular: Regular rate and rhythm. No murmurs. and No edema, swelling or tenderness, except as noted in detailed exam. Integumentary: Scarring noted to bilateral lower extremities from previous history of neurodermatitis. Musculoskeletal: Unremarkable, except as noted in detailed exam.  General: Well developed, well nourished 55 y.o. female in no apparent distress. Normal affect. Normal communication. Patient answers questions appropriately. The patient has a moderate limp favoring left leg  Left Lower Extremities: Examination of the left lower extremity reveals no bony abnormality, no edema, moderate effusion and no ecchymosis. At most mild warmth with palpation to the left knee. There is no erythema to the left knee. There is no valgus or varus abnormality. The patient denies any pain with palpation along the medial lateral joint line of the left knee. The patient is able to achieve -5 degrees extension and 100 degrees flexion with pain at the extremes of motion. The patient has a negative rotational Mcmurray  test. There is at most mild left retropatellar discomfort. The patient has a negative patella stretch test. The patient has a negative varus stress test and a negative valgus stress test, in looking for stability. The patient has a negative Lachman's test.  Vascular: The patient has a negative Bevelyn Buckles' test bilaterally. The patient had a normal dorsalis pedis and posterior tibial pulse. There is normal skin warmth. There is normal capillary refill bilaterally.   Neurologic: The patient has a negative straight leg raise. The patient has normal muscle strength testing for the quadriceps, calves, ankle dorsiflexion, ankle plantarflexion, and extensor hallicus longus. The patient has sensation that is intact to light touch. The deep tendon reflexes are normal at the patella and achilles. No clonus is noted.   Imaging: AP, lateral and sunrise views of the left knee were obtained previously in the office and reviewed today. These x-rays demonstrate moderate soft tissue effusion. Moderate to severe osteoarthritic changes with near complete loss of medial compartment joint space with osteophyte formation off of the medial femoral condyle. On sunrise view the patella is sitting centrally within the patellofemoral groove. No acute fractures visualized. No lytic lesions noted.  Impression: Acute gout of left knee. Knee effusion, left. Primary osteoarthritis of left knee.  Possible Staphylococcal infection of left knee.  Plan:  1. Treatment options were discussed today with the patient. 2. The patient does continue to experience a moderate left knee effusion however no significant warmth to palpation and no fevers or chills at home. 3. The patient is a difficult historian, at this time I am still concerned about continued gout flare versus possible septic arthritis. 4. Because of the culture showing growth of staph aureus, had a very detailed discussion with the patient today on continued treatment  options. 5. At this time I believe it is most beneficial for the patient undergo a left knee arthroscopy with irrigation and debridement. Instructed patient that the possibility of her left knee being infected could lead to significant side effects in the future. 6. The patient was instructed on the risk and benefits of surgery and wishes to proceed at this time. 7. The patient was started on Bactrim twice daily to begin taking at this time to she was instructed to continue with Medication as well, not taking these medications the morning of her surgery however. 8. Surgery will be scheduled for this Wednesday, she was instructed to not eat or drink anything after midnight tomorrow. 9. This document will serve as the surgical history and physical for the patient. 10. The patient will follow-up per standard postop protocol at this time. She was instructed to contact clinic if she has any questions, new symptoms develop or symptoms worsen  The procedure was discussed with the patient, as were the potential risks (including bleeding, infection, nerve and/or blood vessel injury, persistent or recurrent pain, progression of arthritis, need for further surgery, blood clots, strokes, heart attacks and/or arhythmias, pneumonia, etc.) and benefits. The patient states her understanding and wishes to proceed.   H&P reviewed and patient re-examined. No changes.

## 2021-05-04 NOTE — Discharge Instructions (Addendum)
Orthopedic discharge instructions: Keep dressing dry and intact.  May shower after dressing changed on post-op day #4 (Sunday).  Cover sutures with Band-Aids after drying off. Apply ice frequently to knee. Take ibuprofen 600-800 mg TID with meals for 7-10 days, then as necessary. Take pain medication as prescribed or ES Tylenol when needed.  May weight-bear as tolerated - use crutches or walker as needed. Follow-up in 10-14 days or as scheduled.  AMBULATORY SURGERY  DISCHARGE INSTRUCTIONS   The drugs that you were given will stay in your system until tomorrow so for the next 24 hours you should not:  Drive an automobile Make any legal decisions Drink any alcoholic beverage   You may resume regular meals tomorrow.  Today it is better to start with liquids and gradually work up to solid foods.  You may eat anything you prefer, but it is better to start with liquids, then soup and crackers, and gradually work up to solid foods.   Please notify your doctor immediately if you have any unusual bleeding, trouble breathing, redness and pain at the surgery site, drainage, fever, or pain not relieved by medication.    Additional Instructions:    Please contact your physician with any problems or Same Day Surgery at [phone], Monday through Friday 6 am to 4 pm, or [place] at [facility] number at [phone].

## 2021-05-04 NOTE — Op Note (Signed)
05/04/2021  12:44 PM  Patient:   Krista Blake  Pre-Op Diagnosis:   Acute effusion secondary to gout with possible concomitant septic arthritis and underlying degenerative joint disease, left knee.  Postoperative diagnosis:   Acute effusion secondary to gout with degenerative medial and lateral meniscal tears and degenerative joint disease, left knee.  Procedure:   Arthroscopic irrigation and debridement with extensive synovectomy, partial medial and lateral meniscectomies, and abrasion chondroplasty of grade III chondromalacia femoral trochlea, left knee.  Surgeon:   Pascal Lux, MD  Assistant:   Ardelle Park, PA-S  Anesthesia:   General LMA  Findings:   As above. There was an area of complex tearing of the posterior medial portion of the medial viscus and more degenerative tears of the posterior, middle, and anterior portions of the lateral meniscus. There were areas of grade III chondromalacia involving the posterior medial tibial plateau, the femoral trochlea, and the lateral femoral condyle, and areas of grade II chondromalacia involving the patella and medial femoral condyle. The anterior and posterior cruciate ligaments both were in satisfactory condition.  Complications:   None  EBL:   5 cc.  Total fluids:   500 cc of crystalloid.  Tourniquet time:   None  Drains:   None  Closure:   4-0 Prolene interrupted sutures.  Brief clinical note:   The patient is a 55 year old female with a 2-week history of increased pain and swelling of her left knee. She presented to clinic where an aspiration was performed which was consistent with gout. Five days later, the culture results came back suggesting that there may be infection as well. However, her clinical picture was improving so it was elected to observe her. Several days ago, her symptoms began to worsen again so concern was raised that there indeed was some infection in the knee. Therefore, it was elected to proceed  with a formal arthroscopic irrigation and debridement of her left knee.   Procedure:   The patient was brought into the operating room and lain in the supine position. After adequate general laryngeal mask anesthesia was obtained, a timeout was performed to verify the appropriate side. The patient's left knee was injected sterilely using a solution of 30 cc of 1% lidocaine and 30 cc of 0.5% Sensorcaine with epinephrine. The left lower extremity was prepped with ChloraPrep solution before being draped sterilely. Preoperative antibiotics were administered. The expected portal sites were injected with 0.5% Sensorcaine with epinephrine before the camera was placed in the anterolateral portal and instrumentation performed through the anteromedial portal.   The joint fluid showed no significant turbidity or bulkiness to suggest infection or even significant persistent gout. Regardless, samples of the fluid were sent for cell count differential, crystals, and culture and sensitivity. The knee was sequentially examined beginning in the suprapatellar pouch, then progressing to the patellofemoral space, the medial gutter and compartment, the notch, and finally the lateral compartment and gutter. The findings were as described above. Abundant reactive synovial tissues anteriorly, medially, laterally, and in the suprapatellar pouch were debrided using the full-radius resector in order to improve visualization. The areas of meniscal tearing involving the medial and lateral menisci were debrided back to stable margins using the full-radius resector. Subsequent probing of the remaining rim demonstrated good stability. The area of grade III chondromalacia with loose articular cartilage in the femoral trochlea was debrided back to stable margins using the full-radius resector as well. The ArthroCare wand was introduced and used to obtain hemostasis. The instruments were  removed from the joint after suctioning the excess fluid.    The portal sites were closed using 4-0 Prolene interrupted sutures before a sterile bulky dressing was applied to the knee. The patient was then awakened, extubated, and returned to the recovery room in satisfactory condition after tolerating the procedure well.

## 2021-05-04 NOTE — OR Nursing (Signed)
Patient is unsure about her current dose of propranolol. Dr. Amie Critchley noified. He reports it is okay to not give a dose this am.

## 2021-05-04 NOTE — Transfer of Care (Signed)
Immediate Anesthesia Transfer of Care Note  Patient: Krista Blake  Procedure(s) Performed: KNEE ARTHROSCOPY WITH IRRIGATION AND DEBRIDEMENT (Left: Knee)  Patient Location: PACU  Anesthesia Type:General  Level of Consciousness: awake, alert  and oriented  Airway & Oxygen Therapy: Patient Spontanous Breathing and Patient connected to face mask oxygen  Post-op Assessment: Report given to RN and Post -op Vital signs reviewed and stable  Post vital signs: Reviewed and stable  Last Vitals:  Vitals Value Taken Time  BP    Temp    Pulse    Resp    SpO2      Last Pain:  Vitals:   05/04/21 0941  TempSrc: Tympanic  PainSc: 4          Complications: No notable events documented.

## 2021-05-04 NOTE — Anesthesia Preprocedure Evaluation (Signed)
Anesthesia Evaluation  Patient identified by MRN, date of birth, ID band Patient awake    Reviewed: Allergy & Precautions, NPO status , Patient's Chart, lab work & pertinent test results  History of Anesthesia Complications Negative for: history of anesthetic complications  Airway Mallampati: III  TM Distance: >3 FB Neck ROM: full    Dental  (+) Chipped, Poor Dentition   Pulmonary neg pulmonary ROS, neg shortness of breath,    Pulmonary exam normal        Cardiovascular Exercise Tolerance: Good hypertension, Normal cardiovascular exam     Neuro/Psych negative neurological ROS  negative psych ROS   GI/Hepatic negative GI ROS, Neg liver ROS,   Endo/Other  negative endocrine ROS  Renal/GU      Musculoskeletal   Abdominal   Peds  Hematology negative hematology ROS (+)   Anesthesia Other Findings Past Medical History: No date: Bulging lumbar disc 03/04/2015: History of mammogram     Comment:  BIRAD 1 02/09/2016: History of Papanicolaou smear of cervix     Comment:  NIL/NEG No date: Hypertension 02/05/2015: Pap smear abnormality of cervix/human papillomavirus  (HPV) positive     Comment:  NIL Pap with positive HPV aptima.  HPV 16,18.45 neg 07/2018: Screening for colon cancer     Comment:  neg Cologuard; repeat in 3 yrs  History reviewed. No pertinent surgical history.  BMI    Body Mass Index: 39.82 kg/m      Reproductive/Obstetrics negative OB ROS                             Anesthesia Physical Anesthesia Plan  ASA: 3  Anesthesia Plan: General LMA   Post-op Pain Management:    Induction: Intravenous  PONV Risk Score and Plan: Dexamethasone, Ondansetron, Midazolam and Treatment may vary due to age or medical condition  Airway Management Planned: LMA  Additional Equipment:   Intra-op Plan:   Post-operative Plan: Extubation in OR  Informed Consent: I have reviewed  the patients History and Physical, chart, labs and discussed the procedure including the risks, benefits and alternatives for the proposed anesthesia with the patient or authorized representative who has indicated his/her understanding and acceptance.     Dental Advisory Given  Plan Discussed with: Anesthesiologist, CRNA and Surgeon  Anesthesia Plan Comments: (Patient consented for risks of anesthesia including but not limited to:  - adverse reactions to medications - damage to eyes, teeth, lips or other oral mucosa - nerve damage due to positioning  - sore throat or hoarseness - Damage to heart, brain, nerves, lungs, other parts of body or loss of life  Patient voiced understanding.)        Anesthesia Quick Evaluation

## 2021-05-04 NOTE — Anesthesia Procedure Notes (Signed)
Procedure Name: LMA Insertion Date/Time: 05/04/2021 11:16 AM Performed by: Willette Alma, CRNA Pre-anesthesia Checklist: Patient identified, Patient being monitored, Timeout performed, Emergency Drugs available and Suction available Patient Re-evaluated:Patient Re-evaluated prior to induction Oxygen Delivery Method: Circle system utilized Preoxygenation: Pre-oxygenation with 100% oxygen Induction Type: IV induction Ventilation: Mask ventilation without difficulty LMA: LMA inserted LMA Size: 5.0 Tube type: Oral Number of attempts: 1 Placement Confirmation: positive ETCO2 and breath sounds checked- equal and bilateral Tube secured with: Tape Dental Injury: Teeth and Oropharynx as per pre-operative assessment

## 2021-05-04 NOTE — Anesthesia Postprocedure Evaluation (Signed)
Anesthesia Post Note  Patient: Krista Blake  Procedure(s) Performed: KNEE ARTHROSCOPY WITH IRRIGATION AND DEBRIDEMENT (Left: Knee)  Patient location during evaluation: PACU Anesthesia Type: General Level of consciousness: awake and alert Pain management: pain level controlled Vital Signs Assessment: post-procedure vital signs reviewed and stable Respiratory status: spontaneous breathing, nonlabored ventilation, respiratory function stable and patient connected to nasal cannula oxygen Cardiovascular status: blood pressure returned to baseline and stable Postop Assessment: no apparent nausea or vomiting Anesthetic complications: no   No notable events documented.   Last Vitals:  Vitals:   05/04/21 1300 05/04/21 1318  BP: 121/83 (!) 148/84  Pulse: 87 90  Resp: 17 18  Temp: 36.7 C (!) 36.3 C  SpO2: 96% 96%    Last Pain:  Vitals:   05/04/21 1318  TempSrc: Temporal  PainSc: 0-No pain                 Precious Haws Thurl Boen

## 2021-05-05 ENCOUNTER — Encounter: Payer: Self-pay | Admitting: Surgery

## 2021-05-05 MED ORDER — SODIUM CHLORIDE 0.9 % IR SOLN
Status: DC | PRN
  Administered 2021-05-04: 3000 mL

## 2021-05-07 LAB — BODY FLUID CULTURE W GRAM STAIN

## 2021-05-25 ENCOUNTER — Other Ambulatory Visit: Payer: Self-pay | Admitting: Obstetrics and Gynecology

## 2021-05-25 DIAGNOSIS — Z1211 Encounter for screening for malignant neoplasm of colon: Secondary | ICD-10-CM

## 2021-06-01 ENCOUNTER — Other Ambulatory Visit
Admission: RE | Admit: 2021-06-01 | Discharge: 2021-06-01 | Disposition: A | Discharge status: Home / Self Care | Payer: BC Managed Care – PPO | Source: Ambulatory Visit | Attending: Student | Admitting: Student

## 2021-06-01 DIAGNOSIS — M25462 Effusion, left knee: Secondary | ICD-10-CM | POA: Insufficient documentation

## 2021-06-01 DIAGNOSIS — M79605 Pain in left leg: Secondary | ICD-10-CM | POA: Insufficient documentation

## 2021-06-01 LAB — SYNOVIAL CELL COUNT + DIFF, W/ CRYSTALS
Crystals, Fluid: NONE SEEN
Eosinophils-Synovial: 0 %
Lymphocytes-Synovial Fld: 1 %
Monocyte-Macrophage-Synovial Fluid: 1 %
Neutrophil, Synovial: 98 %
WBC, Synovial: 32499 /mm3 — ABNORMAL HIGH (ref 0–200)

## 2021-06-06 ENCOUNTER — Other Ambulatory Visit: Payer: Self-pay | Admitting: Surgery

## 2021-06-06 MED ORDER — CLINDAMYCIN PHOSPHATE 900 MG/50ML IV SOLN: 900.0000 mg | INTRAVENOUS | Status: DC

## 2021-06-07 ENCOUNTER — Inpatient Hospital Stay
Admission: AD | Admit: 2021-06-07 | Discharge: 2021-06-10 | DRG: 487 | Disposition: A | Discharge status: Home Health | Payer: BC Managed Care – PPO | Attending: Surgery | Admitting: Surgery

## 2021-06-07 ENCOUNTER — Inpatient Hospital Stay: Payer: BC Managed Care – PPO | Admitting: Anesthesiology

## 2021-06-07 ENCOUNTER — Encounter
Admission: AD | Disposition: A | Discharge status: Home Health | Payer: Self-pay | Source: Home / Self Care | Attending: Surgery

## 2021-06-07 ENCOUNTER — Encounter: Payer: Self-pay | Admitting: Surgery

## 2021-06-07 ENCOUNTER — Other Ambulatory Visit: Payer: Self-pay

## 2021-06-07 DIAGNOSIS — Z20822 Contact with and (suspected) exposure to covid-19: Secondary | ICD-10-CM | POA: Diagnosis present

## 2021-06-07 DIAGNOSIS — M00062 Staphylococcal arthritis, left knee: Secondary | ICD-10-CM | POA: Diagnosis not present

## 2021-06-07 DIAGNOSIS — Z7952 Long term (current) use of systemic steroids: Secondary | ICD-10-CM

## 2021-06-07 DIAGNOSIS — Z8249 Family history of ischemic heart disease and other diseases of the circulatory system: Secondary | ICD-10-CM

## 2021-06-07 DIAGNOSIS — I10 Essential (primary) hypertension: Secondary | ICD-10-CM | POA: Diagnosis present

## 2021-06-07 DIAGNOSIS — Z79899 Other long term (current) drug therapy: Secondary | ICD-10-CM

## 2021-06-07 DIAGNOSIS — Z791 Long term (current) use of non-steroidal anti-inflammatories (NSAID): Secondary | ICD-10-CM

## 2021-06-07 DIAGNOSIS — E785 Hyperlipidemia, unspecified: Secondary | ICD-10-CM | POA: Diagnosis present

## 2021-06-07 DIAGNOSIS — M109 Gout, unspecified: Secondary | ICD-10-CM | POA: Diagnosis present

## 2021-06-07 DIAGNOSIS — M1712 Unilateral primary osteoarthritis, left knee: Secondary | ICD-10-CM | POA: Diagnosis present

## 2021-06-07 DIAGNOSIS — Z880 Allergy status to penicillin: Secondary | ICD-10-CM

## 2021-06-07 DIAGNOSIS — B9561 Methicillin susceptible Staphylococcus aureus infection as the cause of diseases classified elsewhere: Secondary | ICD-10-CM | POA: Diagnosis present

## 2021-06-07 DIAGNOSIS — M009 Pyogenic arthritis, unspecified: Secondary | ICD-10-CM | POA: Diagnosis present

## 2021-06-07 DIAGNOSIS — Z8619 Personal history of other infectious and parasitic diseases: Secondary | ICD-10-CM

## 2021-06-07 HISTORY — PX: KNEE ARTHROSCOPY: SHX127

## 2021-06-07 LAB — CBC
HCT: 31.8 % — ABNORMAL LOW (ref 36.0–46.0)
Hemoglobin: 10.8 g/dL — ABNORMAL LOW (ref 12.0–15.0)
MCH: 29.8 pg (ref 26.0–34.0)
MCHC: 34 g/dL (ref 30.0–36.0)
MCV: 87.8 fL (ref 80.0–100.0)
Platelets: 442 10*3/uL — ABNORMAL HIGH (ref 150–400)
RBC: 3.62 MIL/uL — ABNORMAL LOW (ref 3.87–5.11)
RDW: 13.1 % (ref 11.5–15.5)
WBC: 12.6 10*3/uL — ABNORMAL HIGH (ref 4.0–10.5)
nRBC: 0 % (ref 0.0–0.2)

## 2021-06-07 LAB — BASIC METABOLIC PANEL
Anion gap: 12 (ref 5–15)
BUN: 12 mg/dL (ref 6–20)
CO2: 26 mmol/L (ref 22–32)
Calcium: 9.9 mg/dL (ref 8.9–10.3)
Chloride: 96 mmol/L — ABNORMAL LOW (ref 98–111)
Creatinine, Ser: 0.78 mg/dL (ref 0.44–1.00)
GFR, Estimated: >60 mL/min (ref >60)
Glucose, Bld: 146 mg/dL — ABNORMAL HIGH (ref 70–99)
Potassium: 4.3 mmol/L (ref 3.5–5.1)
Sodium: 134 mmol/L — ABNORMAL LOW (ref 135–145)

## 2021-06-07 LAB — SARS CORONAVIRUS 2 BY RT PCR (HOSPITAL ORDER, PERFORMED IN ~~LOC~~ HOSPITAL LAB): SARS Coronavirus 2: NEGATIVE

## 2021-06-07 LAB — GLUCOSE, CAPILLARY: Glucose-Capillary: 136 mg/dL — ABNORMAL HIGH (ref 70–99)

## 2021-06-07 SURGERY — ARTHROSCOPY, KNEE
Anesthesia: General | Site: Knee | Laterality: Left

## 2021-06-07 MED ORDER — KETOROLAC TROMETHAMINE 30 MG/ML IJ SOLN: 30.0000 mg | Freq: Once | INTRAMUSCULAR | Status: AC

## 2021-06-07 MED ORDER — MORPHINE SULFATE (PF) 2 MG/ML IV SOLN
1.0000 mg | INTRAVENOUS | Status: DC | PRN
  Administered 2021-06-07 – 2021-06-09 (×3): 2 mg via INTRAVENOUS
  Filled 2021-06-07 (×3): qty 1

## 2021-06-07 MED ORDER — DIPHENHYDRAMINE HCL 12.5 MG/5ML PO ELIX: 12.5000 mg | ORAL_SOLUTION | ORAL | Status: DC | PRN

## 2021-06-07 MED ORDER — METOCLOPRAMIDE HCL 10 MG PO TABS: 5.0000 mg | ORAL_TABLET | Freq: Three times a day (TID) | ORAL | Status: DC | PRN

## 2021-06-07 MED ORDER — ACETAMINOPHEN 10 MG/ML IV SOLN: 1000.0000 mg | Freq: Once | INTRAVENOUS | Status: DC | PRN

## 2021-06-07 MED ORDER — DIPHENHYDRAMINE HCL 50 MG/ML IJ SOLN: 12.5000 mg | Freq: Once | INTRAMUSCULAR | Status: AC

## 2021-06-07 MED ORDER — PAROXETINE HCL 20 MG PO TABS
20.0000 mg | ORAL_TABLET | Freq: Every day | ORAL | Status: DC
  Administered 2021-06-08 – 2021-06-10 (×3): 20 mg via ORAL
  Filled 2021-06-07 (×3): qty 1

## 2021-06-07 MED ORDER — MIDAZOLAM HCL 2 MG/2ML IJ SOLN
INTRAMUSCULAR | Status: DC | PRN
  Administered 2021-06-07: 2 mg via INTRAVENOUS

## 2021-06-07 MED ORDER — NEOMYCIN-POLYMYXIN B GU 40-200000 IR SOLN
Status: AC
  Filled 2021-06-07: qty 20

## 2021-06-07 MED ORDER — OXYCODONE HCL 5 MG PO TABS: 5.0000 mg | ORAL_TABLET | Freq: Once | ORAL | Status: DC | PRN

## 2021-06-07 MED ORDER — METHOCARBAMOL 500 MG PO TABS
750.0000 mg | ORAL_TABLET | Freq: Three times a day (TID) | ORAL | Status: DC | PRN
  Administered 2021-06-07 – 2021-06-10 (×7): 750 mg via ORAL
  Filled 2021-06-07 (×7): qty 2

## 2021-06-07 MED ORDER — LIDOCAINE HCL (CARDIAC) PF 100 MG/5ML IV SOSY
PREFILLED_SYRINGE | INTRAVENOUS | Status: DC | PRN
  Administered 2021-06-07: 100 mg via INTRAVENOUS

## 2021-06-07 MED ORDER — HYDROMORPHONE HCL 1 MG/ML IJ SOLN
INTRAMUSCULAR | Status: AC
  Filled 2021-06-07: qty 1

## 2021-06-07 MED ORDER — VANCOMYCIN HCL IN DEXTROSE 1-5 GM/200ML-% IV SOLN
1000.0000 mg | Freq: Two times a day (BID) | INTRAVENOUS | Status: DC
  Filled 2021-06-07: qty 200

## 2021-06-07 MED ORDER — FENTANYL CITRATE (PF) 100 MCG/2ML IJ SOLN
INTRAMUSCULAR | Status: AC
  Filled 2021-06-07: qty 2

## 2021-06-07 MED ORDER — DEXMEDETOMIDINE HCL IN NACL 200 MCG/50ML IV SOLN
INTRAVENOUS | Status: DC | PRN
  Administered 2021-06-07 (×2): 8 ug via INTRAVENOUS

## 2021-06-07 MED ORDER — PROPRANOLOL HCL 20 MG PO TABS
20.0000 mg | ORAL_TABLET | Freq: Every day | ORAL | Status: DC
  Administered 2021-06-08 – 2021-06-10 (×3): 20 mg via ORAL
  Filled 2021-06-07 (×3): qty 1

## 2021-06-07 MED ORDER — ACETAMINOPHEN 10 MG/ML IV SOLN
INTRAVENOUS | Status: AC
  Filled 2021-06-07: qty 100

## 2021-06-07 MED ORDER — ONDANSETRON HCL 4 MG PO TABS: 4.0000 mg | ORAL_TABLET | Freq: Four times a day (QID) | ORAL | Status: DC | PRN

## 2021-06-07 MED ORDER — POTASSIUM 99 MG PO TABS: 99.0000 mg | ORAL_TABLET | Freq: Every day | ORAL | Status: DC

## 2021-06-07 MED ORDER — FENTANYL CITRATE (PF) 100 MCG/2ML IJ SOLN
25.0000 ug | INTRAMUSCULAR | Status: DC | PRN
  Administered 2021-06-07: 25 ug via INTRAVENOUS
  Administered 2021-06-07: 50 ug via INTRAVENOUS

## 2021-06-07 MED ORDER — FENTANYL CITRATE (PF) 100 MCG/2ML IJ SOLN
INTRAMUSCULAR | Status: AC
  Administered 2021-06-07: 25 ug via INTRAVENOUS
  Filled 2021-06-07: qty 2

## 2021-06-07 MED ORDER — ROSUVASTATIN CALCIUM 10 MG PO TABS
10.0000 mg | ORAL_TABLET | Freq: Every day | ORAL | Status: DC
  Administered 2021-06-08 – 2021-06-10 (×3): 10 mg via ORAL
  Filled 2021-06-07 (×3): qty 1

## 2021-06-07 MED ORDER — SODIUM CHLORIDE 0.9 % IV SOLN: INTRAVENOUS | Status: DC

## 2021-06-07 MED ORDER — ENOXAPARIN SODIUM 40 MG/0.4ML IJ SOSY
40.0000 mg | PREFILLED_SYRINGE | INTRAMUSCULAR | Status: DC
  Administered 2021-06-08 – 2021-06-10 (×3): 40 mg via SUBCUTANEOUS
  Filled 2021-06-07 (×3): qty 0.4

## 2021-06-07 MED ORDER — HYDROCHLOROTHIAZIDE 25 MG PO TABS
25.0000 mg | ORAL_TABLET | Freq: Every day | ORAL | Status: DC
  Administered 2021-06-08 – 2021-06-10 (×3): 25 mg via ORAL
  Filled 2021-06-07 (×3): qty 1

## 2021-06-07 MED ORDER — FENTANYL CITRATE (PF) 100 MCG/2ML IJ SOLN
INTRAMUSCULAR | Status: AC
  Filled 2021-06-07: qty 4

## 2021-06-07 MED ORDER — BUPIVACAINE-EPINEPHRINE (PF) 0.5% -1:200000 IJ SOLN
INTRAMUSCULAR | Status: AC
  Filled 2021-06-07: qty 30

## 2021-06-07 MED ORDER — ACETAMINOPHEN 500 MG PO TABS
500.0000 mg | ORAL_TABLET | Freq: Four times a day (QID) | ORAL | Status: AC
  Administered 2021-06-07 – 2021-06-08 (×4): 500 mg via ORAL
  Filled 2021-06-07 (×4): qty 1

## 2021-06-07 MED ORDER — VANCOMYCIN HCL 1000 MG IV SOLR
INTRAVENOUS | Status: AC
  Filled 2021-06-07: qty 20

## 2021-06-07 MED ORDER — BISACODYL 10 MG RE SUPP
10.0000 mg | Freq: Every day | RECTAL | Status: DC | PRN
  Filled 2021-06-07: qty 1

## 2021-06-07 MED ORDER — HYDROCODONE-ACETAMINOPHEN 5-325 MG PO TABS: 1.0000 | ORAL_TABLET | ORAL | Status: DC | PRN

## 2021-06-07 MED ORDER — ALLOPURINOL 100 MG PO TABS
100.0000 mg | ORAL_TABLET | Freq: Every day | ORAL | Status: DC
  Administered 2021-06-08 – 2021-06-10 (×3): 100 mg via ORAL
  Filled 2021-06-07 (×3): qty 1

## 2021-06-07 MED ORDER — CEFAZOLIN SODIUM-DEXTROSE 2-4 GM/100ML-% IV SOLN
INTRAVENOUS | Status: AC
  Filled 2021-06-07: qty 100

## 2021-06-07 MED ORDER — KETOROLAC TROMETHAMINE 15 MG/ML IJ SOLN
15.0000 mg | Freq: Four times a day (QID) | INTRAMUSCULAR | Status: AC
  Administered 2021-06-07 – 2021-06-08 (×4): 15 mg via INTRAVENOUS
  Filled 2021-06-07 (×5): qty 1

## 2021-06-07 MED ORDER — PROPOFOL 10 MG/ML IV BOLUS
INTRAVENOUS | Status: DC | PRN
  Administered 2021-06-07: 150 mg via INTRAVENOUS

## 2021-06-07 MED ORDER — VANCOMYCIN HCL 1000 MG IV SOLR
INTRAVENOUS | Status: DC | PRN
  Administered 2021-06-07: 1000 mg via INTRAVENOUS

## 2021-06-07 MED ORDER — MAGNESIUM 500 MG PO TABS: 500.0000 mg | ORAL_TABLET | Freq: Every day | ORAL | Status: DC

## 2021-06-07 MED ORDER — KETOROLAC TROMETHAMINE 30 MG/ML IJ SOLN
INTRAMUSCULAR | Status: AC
  Administered 2021-06-07: 30 mg via INTRAVENOUS
  Filled 2021-06-07: qty 1

## 2021-06-07 MED ORDER — MAGNESIUM HYDROXIDE 400 MG/5ML PO SUSP: 30.0000 mL | Freq: Every day | ORAL | Status: DC | PRN

## 2021-06-07 MED ORDER — CEFAZOLIN SODIUM-DEXTROSE 2-4 GM/100ML-% IV SOLN
2.0000 g | Freq: Once | INTRAVENOUS | Status: AC
  Administered 2021-06-07: 2 g via INTRAVENOUS

## 2021-06-07 MED ORDER — ORAL CARE MOUTH RINSE: 15.0000 mL | Freq: Once | OROMUCOSAL | Status: AC

## 2021-06-07 MED ORDER — HYDROCODONE-ACETAMINOPHEN 5-325 MG PO TABS
1.0000 | ORAL_TABLET | ORAL | Status: DC | PRN
  Administered 2021-06-08: 1 via ORAL
  Administered 2021-06-08 – 2021-06-10 (×8): 2 via ORAL
  Filled 2021-06-07 (×7): qty 2
  Filled 2021-06-07: qty 1
  Filled 2021-06-07 (×2): qty 2

## 2021-06-07 MED ORDER — ACETAMINOPHEN 10 MG/ML IV SOLN
INTRAVENOUS | Status: DC | PRN
  Administered 2021-06-07: 1000 mg via INTRAVENOUS

## 2021-06-07 MED ORDER — MIDAZOLAM HCL 2 MG/2ML IJ SOLN
INTRAMUSCULAR | Status: AC
  Filled 2021-06-07: qty 2

## 2021-06-07 MED ORDER — MAGNESIUM OXIDE -MG SUPPLEMENT 400 (240 MG) MG PO TABS
400.0000 mg | ORAL_TABLET | Freq: Every day | ORAL | Status: DC
  Administered 2021-06-08 – 2021-06-10 (×3): 400 mg via ORAL
  Filled 2021-06-07 (×3): qty 1

## 2021-06-07 MED ORDER — DOCUSATE SODIUM 100 MG PO CAPS
100.0000 mg | ORAL_CAPSULE | Freq: Two times a day (BID) | ORAL | Status: DC
  Administered 2021-06-07 – 2021-06-10 (×6): 100 mg via ORAL
  Filled 2021-06-07 (×6): qty 1

## 2021-06-07 MED ORDER — BUPIVACAINE-EPINEPHRINE (PF) 0.5% -1:200000 IJ SOLN
INTRAMUSCULAR | Status: DC | PRN
  Administered 2021-06-07: 20 mL

## 2021-06-07 MED ORDER — OXYCODONE HCL 5 MG/5ML PO SOLN
5.0000 mg | Freq: Once | ORAL | Status: DC | PRN
Start: 2021-06-07 — End: 2021-06-07

## 2021-06-07 MED ORDER — CHLORHEXIDINE GLUCONATE 0.12 % MT SOLN
OROMUCOSAL | Status: AC
  Administered 2021-06-07: 15 mL via OROMUCOSAL
  Filled 2021-06-07: qty 15

## 2021-06-07 MED ORDER — VANCOMYCIN HCL 1500 MG/300ML IV SOLN
1500.0000 mg | Freq: Once | INTRAVENOUS | Status: DC
  Administered 2021-06-07: 1500 mg via INTRAVENOUS
  Filled 2021-06-07: qty 300

## 2021-06-07 MED ORDER — LACTATED RINGERS IR SOLN
Status: DC | PRN
  Administered 2021-06-07: 9000 mL

## 2021-06-07 MED ORDER — FENTANYL CITRATE (PF) 100 MCG/2ML IJ SOLN
INTRAMUSCULAR | Status: DC | PRN
  Administered 2021-06-07 (×2): 50 ug via INTRAVENOUS
  Administered 2021-06-07: 100 ug via INTRAVENOUS

## 2021-06-07 MED ORDER — DIPHENHYDRAMINE HCL 50 MG/ML IJ SOLN
INTRAMUSCULAR | Status: AC
  Administered 2021-06-07: 12.5 mg via INTRAVENOUS
  Filled 2021-06-07: qty 1

## 2021-06-07 MED ORDER — LACTATED RINGERS IV SOLN: INTRAVENOUS | Status: DC | PRN

## 2021-06-07 MED ORDER — CEFAZOLIN SODIUM-DEXTROSE 2-4 GM/100ML-% IV SOLN
2.0000 g | Freq: Three times a day (TID) | INTRAVENOUS | Status: DC
  Administered 2021-06-08: 2 g via INTRAVENOUS
  Filled 2021-06-07: qty 100

## 2021-06-07 MED ORDER — CHLORHEXIDINE GLUCONATE 0.12 % MT SOLN: 15.0000 mL | Freq: Once | OROMUCOSAL | Status: AC

## 2021-06-07 MED ORDER — TRAMADOL HCL 50 MG PO TABS
50.0000 mg | ORAL_TABLET | Freq: Four times a day (QID) | ORAL | Status: DC | PRN
Start: 2021-06-07 — End: 2021-06-10
  Administered 2021-06-08 – 2021-06-10 (×4): 50 mg via ORAL
  Filled 2021-06-07 (×4): qty 1

## 2021-06-07 MED ORDER — ONDANSETRON HCL 4 MG/2ML IJ SOLN
INTRAMUSCULAR | Status: DC | PRN
  Administered 2021-06-07: 4 mg via INTRAVENOUS

## 2021-06-07 MED ORDER — CLINDAMYCIN PHOSPHATE 900 MG/50ML IV SOLN
INTRAVENOUS | Status: AC
  Filled 2021-06-07: qty 50

## 2021-06-07 MED ORDER — METOCLOPRAMIDE HCL 5 MG/ML IJ SOLN: 5.0000 mg | Freq: Three times a day (TID) | INTRAMUSCULAR | Status: DC | PRN

## 2021-06-07 MED ORDER — DEXAMETHASONE SODIUM PHOSPHATE 10 MG/ML IJ SOLN
INTRAMUSCULAR | Status: DC | PRN
  Administered 2021-06-07: 10 mg via INTRAVENOUS

## 2021-06-07 MED ORDER — FLEET ENEMA 7-19 GM/118ML RE ENEM: 1.0000 | ENEMA | Freq: Once | RECTAL | Status: DC | PRN

## 2021-06-07 MED ORDER — HYDRALAZINE HCL 20 MG/ML IJ SOLN
INTRAMUSCULAR | Status: DC | PRN
  Administered 2021-06-07 (×3): 5 mg via INTRAVENOUS

## 2021-06-07 MED ORDER — NEOMYCIN-POLYMYXIN B GU 40-200000 IR SOLN
Status: DC | PRN
  Administered 2021-06-07: 12 mL

## 2021-06-07 MED ORDER — ONDANSETRON HCL 4 MG/2ML IJ SOLN: 4.0000 mg | Freq: Once | INTRAMUSCULAR | Status: DC | PRN

## 2021-06-07 MED ORDER — HYDROMORPHONE HCL 1 MG/ML IJ SOLN
INTRAMUSCULAR | Status: DC | PRN
  Administered 2021-06-07 (×2): .5 mg via INTRAVENOUS

## 2021-06-07 MED ORDER — ONDANSETRON HCL 4 MG/2ML IJ SOLN: 4.0000 mg | Freq: Four times a day (QID) | INTRAMUSCULAR | Status: DC | PRN

## 2021-06-07 MED ORDER — ACETAMINOPHEN 325 MG PO TABS: 325.0000 mg | ORAL_TABLET | Freq: Four times a day (QID) | ORAL | Status: DC | PRN

## 2021-06-07 SURGICAL SUPPLY — 39 items
APL PRP STRL LF DISP 70% ISPRP (MISCELLANEOUS) ×1
BAG COUNTER SPONGE SURGICOUNT (BAG) IMPLANT
BAG SPNG CNTER NS LX DISP (BAG)
BLADE FULL RADIUS 3.5 (BLADE) ×2 IMPLANT
BLADE SHAVER 4.5X7 STR FR (MISCELLANEOUS) ×1 IMPLANT
BNDG ELASTIC 6X5.8 VLCR STR LF (GAUZE/BANDAGES/DRESSINGS) ×2 IMPLANT
BNDG ESMARK 6X12 TAN STRL LF (GAUZE/BANDAGES/DRESSINGS) ×2 IMPLANT
CHLORAPREP W/TINT 26 (MISCELLANEOUS) ×2 IMPLANT
CUFF TOURN SGL QUICK 24 (TOURNIQUET CUFF)
CUFF TOURN SGL QUICK 34 (TOURNIQUET CUFF) ×2
CUFF TRNQT CYL 24X4X16.5-23 (TOURNIQUET CUFF) IMPLANT
CUFF TRNQT CYL 34X4.125X (TOURNIQUET CUFF) IMPLANT
DRAPE IMP U-DRAPE 54X76 (DRAPES) ×2 IMPLANT
ELECT REM PT RETURN 9FT ADLT (ELECTROSURGICAL)
ELECTRODE REM PT RTRN 9FT ADLT (ELECTROSURGICAL) ×1 IMPLANT
GAUZE SPONGE 4X4 12PLY STRL (GAUZE/BANDAGES/DRESSINGS) ×2 IMPLANT
GLOVE SURG ENC MOIS LTX SZ8 (GLOVE) ×4 IMPLANT
GLOVE SURG ENC TEXT LTX SZ7 (GLOVE) ×4 IMPLANT
GLOVE SURG UNDER LTX SZ8 (GLOVE) ×2 IMPLANT
GLOVE SURG UNDER POLY LF SZ7.5 (GLOVE) ×2 IMPLANT
GOWN STRL REUS W/ TWL LRG LVL3 (GOWN DISPOSABLE) ×1 IMPLANT
GOWN STRL REUS W/ TWL XL LVL3 (GOWN DISPOSABLE) ×2 IMPLANT
GOWN STRL REUS W/TWL LRG LVL3 (GOWN DISPOSABLE) ×2
GOWN STRL REUS W/TWL XL LVL3 (GOWN DISPOSABLE) ×4
HEMOVAC 400CC 10FR (MISCELLANEOUS) ×1 IMPLANT
IV LACTATED RINGER IRRG 3000ML (IV SOLUTION) ×6
IV LR IRRIG 3000ML ARTHROMATIC (IV SOLUTION) ×1 IMPLANT
KIT TURNOVER KIT A (KITS) ×2 IMPLANT
MANIFOLD NEPTUNE II (INSTRUMENTS) ×3 IMPLANT
NDL HYPO 21X1.5 SAFETY (NEEDLE) ×1 IMPLANT
NEEDLE HYPO 21X1.5 SAFETY (NEEDLE) ×2 IMPLANT
PACK ARTHROSCOPY KNEE (MISCELLANEOUS) ×2 IMPLANT
PENCIL ELECTRO HAND CTR (MISCELLANEOUS) ×1 IMPLANT
SPONGE T-LAP 18X18 ~~LOC~~+RFID (SPONGE) ×2 IMPLANT
SUT PROLENE 4 0 PS 2 18 (SUTURE) ×2 IMPLANT
SUT TICRON COATED BLUE 2 0 30 (SUTURE) IMPLANT
SYR 50ML LL SCALE MARK (SYRINGE) ×2 IMPLANT
TUBING INFLOW SET DBFLO PUMP (TUBING) ×2 IMPLANT
WAND WEREWOLF FLOW 90D (MISCELLANEOUS) ×2 IMPLANT

## 2021-06-07 NOTE — H&P (Signed)
History of Present Illness:  Krista Blake is a 55 y.o. female who presents for follow-up now 5 weeks status post a arthroscopic irrigation debridement of her left knee for presumed gout. Her postoperative cultures demonstrated the growth of rare staph aureus, so she was kept on an oral antibiotic for 2 weeks postoperatively. Over the past 1 to 2 weeks, she has noted progressively worsening pain in her knee. A repeat aspiration was performed last week. These cultures have come back this morning and demonstrate the presence of methicillin sensitive staph aureus in her knee, but no crystals. The patient complains of moderate to severe pain in her knee which she rates at 10/10, and for which she has been taking hydrocodone as necessary with limited benefit. She also continues to take her gout medication, including indomethacin and allopurinol. She denies any fevers or chills, and denies any reinjury to the knee.  Current Outpatient Medications:  acetaminophen (TYLENOL) 500 MG tablet Take 1,000 mg by mouth as needed for Pain   allopurinoL (ZYLOPRIM) 100 MG tablet Take 1 tablet (100 mg total) by mouth once daily 30 tablet 2   colchicine (COLCRYS) 0.6 mg tablet TAKE 1 TABLET BY MOUTH ONCE DAILY. (Patient not taking: No sig reported) 30 tablet 0   hydroCHLOROthiazide (HYDRODIURIL) 25 MG tablet TAKE 1 TABLET BY MOUTH EVERY DAY 90 tablet 3   HYDROcodone-acetaminophen (NORCO) 5-325 mg tablet Take 1 tablet by mouth every 6 (six) hours as needed for Pain for up to 20 doses (Patient taking differently: Take 1 tablet by mouth every 4 (four) hours as needed for Pain) 20 tablet 0   indomethacin (INDOCIN) 50 MG capsule Take 1 capsule (50 mg total) by mouth 2 (two) times daily with meals for 90 days 60 capsule 2   meloxicam (MOBIC) 15 MG tablet Take 15 mg by mouth once daily (Patient not taking: No sig reported)   methocarbamoL (ROBAXIN) 750 MG tablet Take 1 tablet (750 mg total) by mouth 3 (three) times daily 30  tablet 1   PARoxetine (PAXIL) 20 MG tablet Take 1 tablet (20 mg total) by mouth once daily 90 tablet 1   phentermine (ADIPEX-P) 37.5 mg tablet TAKE 1 TABLET BY MOUTH EVERY MORNING BEFORE BREAKFAST FOR 30 DAYS.   POTASSIUM ACETATE MISC Take 1 tablet by mouth once daily   predniSONE (DELTASONE) 20 MG tablet Take 20 mg by mouth 2 (two) times daily (Patient not taking: No sig reported)   propranoloL (INDERAL) 40 MG tablet Take 1 tablet (40 mg total) by mouth 2 (two) times daily 180 tablet 1   rosuvastatin (CRESTOR) 10 MG tablet Take 1 tablet (10 mg total) by mouth once daily 30 tablet 11   tiZANidine (ZANAFLEX) 4 MG tablet Take 1 tablet (4 mg total) by mouth 3 (three) times daily 90 tablet 0   traMADoL (ULTRAM) 50 mg tablet tramadol 50 mg tablet TAKE 1 TABLET (50 MG TOTAL) BY MOUTH EVERY 8 (EIGHT) HOURS AS NEEDED FOR UP TO 5 DAYS (Patient not taking: No sig reported)   Allergies   Penicillins Hives   Past Medical History:   Chicken pox   HTN (hypertension)   Past Surgical History:   Arthroscopic irrigation and debridement with extensive synovectomy, partial medial and lateral meniscectomies, and abrasion chondroplasty of grade III chondromalacia femoral trochlea, left knee Left 05/04/2021 (Dr. Roland Rack)   Family History:   High blood pressure (Hypertension) Father   High blood pressure (Hypertension) Paternal Grandfather   No Known Problems Mother  No Known Problems Sister   No Known Problems Brother   Social History:   Socioeconomic History:   Marital status: Single   Number of children: 0   Years of education: 10  Occupational History   Occupation: Radio broadcast assistant  Tobacco Use   Smoking status: Never Smoker   Smokeless tobacco: Never Used  Substance and Sexual Activity   Alcohol use: Yes  Alcohol/week: 0.0 standard drinks   Drug use: Never   Sexual activity: Yes  Partners: Male  Birth control/protection: Pill   Review of Systems:  A comprehensive 14 point ROS was  performed, reviewed, and the pertinent orthopaedic findings are documented in the HPI.  Physical Exam: Vitals:  06/06/21 1427  BP: (!) 144/88  Weight: (!) 105.2 kg (232 lb)  Height: 162.6 cm ('5\' 4"'$ )  PainSc: 10-Worst pain ever  PainLoc: Knee   General/Constitutional: Pleasant overweight middle-aged female in mild distress. Neuro/Psych: Normal mood and affect, oriented to person, place and time. Eyes: Non-icteric. Pupils are equal, round, and reactive to light, and exhibit synchronous movement. ENT: Unremarkable. Lymphatic: No palpable adenopathy. Respiratory: Lungs clear to auscultation, Normal chest excursion, No wheezes and Non-labored breathing Cardiovascular: Regular rate and rhythm. No murmurs. and No edema, swelling or tenderness, except as noted in detailed exam. Integumentary: No impressive skin lesions present, except as noted in detailed exam. Musculoskeletal: Unremarkable, except as noted in detailed exam.  Left knee exam: The patient is gait is not assessed on today's visit as she presents in a wheelchair. Skin inspection of the left knee is notable for well-healed arthroscopic portal sites, as well as numerous superficial ulcerations secondary to her history of "neurodermatitis". There is mild swelling diffusely around the knee, as well as a trace to 1+ effusion. However, she has no erythema, ecchymosis, or abrasions, nor did she have any significant warmth around the knee. She is able to extend her knee to within 10 degrees of full extension and flex her knee beyond 75 degrees with discomfort at the extremes of both flexion and extension. Her patella tracks well and the knee is stable to varus and valgus stressing. She is neurovascularly intact to her left foot.  Lab data:  The results from her recent knee aspiration show 32,500 white cells but no crystals with 98% neutrophils. It also demonstrates methicillin sensitive staph aureus which is sensitive to everything except  penicillin.  Assessment: Septic arthritis of left knee.   Plan: The treatment options were discussed with the patient. In addition, patient educational materials were provided regarding the diagnosis and treatment options. The patient is quite frustrated by her worsening symptoms and function limitations. Certainly, based on her recent knee aspiration results, I feel that she has an active infection going on inside her knee and not simply gout. Therefore, I have recommended that we proceed with urgent surgical intervention to include an arthroscopic irrigation and debridement of her knee. The procedure was discussed with the patient, as were the potential risks (including bleeding, infection, nerve and/or blood vessel injury, persistent or recurrent pain, failure of the repair, progression of arthritis, need for further surgery, blood clots, strokes, heart attacks and/or arhythmias, pneumonia, etc.) and benefits. The patient states her understanding and wishes to proceed. All of the patient's questions and concerns were answered. She can call any time with further concerns. She will follow up post-surgery, routine.   H&P reviewed and patient re-examined. No changes.

## 2021-06-07 NOTE — Consult Note (Signed)
NAME: Krista Blake  DOB: 12-Jun-1966  MRN: [id number]  Date/Time: 06/07/2021 2:42 PM  REQUESTING PROVIDER: Dr. Roland Rack Subjective:  REASON FOR CONSULT: Left knee septic arthritis due to MSSA ? Krista Blake is a 55 y.o. female with a history of hypertension  Admitted today for undergoing I/D to left knee She initially presented to Dr. Nicholaus Bloom office on 04/18/2021 with new onset left knee pain.  It had started 10 days prior to her presentation.  She has a history of left medial meniscus tear.  She did not elect for arthroscopy and has been treated conservatively with steroid injections and oral medications in he past.  She had noted that in the last 10 days of swelling in the knee had increased and pain was also worse.  She went to [facility] urgent care and received left knee steroid injection . The pain elief was short lived. On 04/18/2021 she saw Dr.Poggi and he did an aspiration of the knee and gave her Medrol pack.  The fluid was sent for cell count, crystals and culture. There were 55,943 cells with 96% neutrophils.  Extracellular monosodium urate crystals were seen.  She was treated like gout and started on NSAID and colchicine.  SThe culture was staph aureus.  This was not treated because they thought it was contamination as the patient was improving.  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  Culture again was staph aureus.  Patient was started on Bactrim post surgery.  On follow-up visit she had not significantly improved.  She was still complaining of pain.  So she was taken to the OR today and underwent arthroscopic irrigation, debridement with extensive debridement and abrasion chondroplasty of the medial femoral condyle, lateral femoral  condyle and patella of the left knee.  Cultures were sent.  I am consulted for antibiotic management.  Patient received a dose of cefazolin, vancomycin as perioperative antibiotic. Past Medical History:  Diagnosis Date   Bulging lumbar disc    History of mammogram 03/04/2015   BIRAD 1   History of Papanicolaou smear of cervix 02/09/2016   NIL/NEG   Hypertension    Pap smear abnormality of cervix/human papillomavirus (HPV) positive 02/05/2015   NIL Pap with positive HPV aptima.  HPV 16,18.45 neg   Screening for colon cancer 07/2018   neg Cologuard; repeat in 3 yrs  Nodular prurigo Past Surgical History:  Procedure Laterality Date   I & D EXTREMITY Left 05/04/2021   Procedure: KNEE ARTHROSCOPY WITH IRRIGATION AND DEBRIDEMENT;  Surgeon: Corky Mull, MD;  Location: [facility] ORS;  Service: Orthopedics;  Laterality: Left;    Social History   Socioeconomic History   Marital status: Single    Spouse name: Not on file   Number of children: Not on file   Years of education: Not on file   Highest education level: Not on file  Occupational History   Not on file  Tobacco Use   Smoking status: Never   Smokeless tobacco: Never  Vaping Use   Vaping Use: Never used  Substance and Sexual Activity   Alcohol use: Yes    Alcohol/week: 0.0 standard drinks    Comment: social   Drug use: No   Sexual activity: Not Currently    Birth control/protection: Pill  Other Topics Concern   Not on  file  Social History Narrative   Not on file   Social Determinants of Health   Financial Resource Strain: Not on file  Food Insecurity: Not on file  Transportation Needs: Not on file  Physical Activity: Not on file  Stress: Not on file  Social Connections: Not on file  Intimate Partner Violence: Not on file    Family History  Problem Relation Age of Onset   Hypertension Father    Diabetes Paternal Uncle    Diabetes Paternal Grandfather    Ovarian cancer Neg Hx    Breast cancer Neg Hx    Colon  cancer Neg Hx    Allergies  Allergen Reactions   Penicillins Hives    Reaction : Hives during college TOLERATED CEFAZOLIN AND AMOXICILLIN   I? Current Facility-Administered Medications  Medication Dose Route Frequency Provider Last Rate Last Admin   0.9 %  sodium chloride infusion   Intravenous Continuous Arita Miss, MD 10 mL/hr at 06/07/21 1159 New Bag at 06/07/21 1159   acetaminophen (OFIRMEV) IV 1,000 mg  1,000 mg Intravenous Once PRN Arita Miss, MD       bupivacaine-epinephrine (MARCAINE W/ EPI) 0.5% -1:200000 injection    PRN Poggi, Marshall Cork, MD   20 mL at 06/07/21 1355   fentaNYL (SUBLIMAZE) injection 25-50 mcg  25-50 mcg Intravenous Q5 min PRN Arita Miss, MD       ketorolac (TORADOL) 30 MG/ML injection 30 mg  30 mg Intravenous Once Poggi, Marshall Cork, MD       lactated ringers irrigation solution    PRN Roland Rack, Marshall Cork, MD   9,000 mL at 06/07/21 1405   neomycin-polymyxin B (NEOSPORIN) irrigation solution    PRN Poggi, Marshall Cork, MD   12 mL at 06/07/21 1406   ondansetron (ZOFRAN) injection 4 mg  4 mg Intravenous Once PRN Arita Miss, MD       oxyCODONE (Oxy IR/ROXICODONE) immediate release tablet 5 mg  5 mg Oral Once PRN Arita Miss, MD       Or   oxyCODONE (ROXICODONE) 5 MG/5ML solution 5 mg  5 mg Oral Once PRN Arita Miss, MD       Facility-Administered Medications Ordered in Other Encounters  Medication Dose Route Frequency Provider Last Rate Last Admin   acetaminophen (OFIRMEV) IV   Intravenous Anesthesia Intra-op Nelda Marseille, CRNA   1,000 mg at 06/07/21 1427   dexamethasone (DECADRON) injection   Intravenous Anesthesia Intra-op Nelda Marseille, CRNA   10 mg at 06/07/21 1405   dexmedetomidine (PRECEDEX) 200 MCG/50ML (4 mcg/mL) infusion   Intravenous Anesthesia Intra-op Nelda Marseille, CRNA   8 mcg at 06/07/21 1421   fentaNYL (SUBLIMAZE) injection   Intravenous Anesthesia Intra-op Nelda Marseille, CRNA   50 mcg at 06/07/21 1341   hydrALAZINE (APRESOLINE) injection   Intravenous Anesthesia Intra-op  Nelda Marseille, CRNA   5 mg at 06/07/21 1412   HYDROmorphone (DILAUDID) injection   Intravenous Anesthesia Intra-op Nelda Marseille, CRNA   0.5 mg at 06/07/21 1405   lactated ringers infusion   Intravenous Continuous PRN Nelda Marseille, CRNA   New Bag at 06/07/21 1330   lidocaine (cardiac) 100 mg/35m (XYLOCAINE) injection 2%   Intravenous Anesthesia Intra-op NNelda Marseille CRNA   100 mg at 06/07/21 1336   midazolam (VERSED) injection   Intravenous Anesthesia Intra-op NNelda Marseille CRNA   2 mg at 06/07/21 1330   ondansetron (ZOFRAN) injection   Intravenous Anesthesia Intra-op NNelda Marseille CRNA   4 mg at 06/07/21 1405  propofol (DIPRIVAN) 10 mg/mL bolus/IV push   Intravenous Anesthesia Intra-op Nelda Marseille, CRNA   150 mg at 06/07/21 1336   vancomycin (VANCOCIN) 1,000 mg in sodium chloride 0.9 % 250 mL IVPB   Intravenous Continuous PRN Nelda Marseille, CRNA   1,000 mg at 06/07/21 1355     Abtx:  Anti-infectives (From admission, onward)    Start     Dose/Rate Route Frequency Ordered Stop   06/07/21 1153  ceFAZolin (ANCEF) 2-4 GM/100ML-% IVPB       Note to Pharmacy: Trudie Reed   : cabinet override      06/07/21 1153 06/07/21 1340   06/07/21 1145  ceFAZolin (ANCEF) IVPB 2g/100 mL premix        2 g 200 mL/hr over 30 Minutes Intravenous  Once 06/07/21 1130 06/07/21 1409   06/07/21 1033  clindamycin (CLEOCIN) 900 MG/50ML IVPB  Status:  Discontinued       Note to Pharmacy: Trudie Reed   : cabinet override      06/07/21 1033 06/07/21 1157   06/07/21 0600  clindamycin (CLEOCIN) IVPB 900 mg  Status:  Discontinued        900 mg 100 mL/hr over 30 Minutes Intravenous On call to O.R. 06/06/21 2241 06/07/21 1130       REVIEW OF SYSTEMS:  Const: negative fever, negative chills, negative weight loss Eyes: negative diplopia or visual changes, negative eye pain ENT: negative coryza, negative sore throat Resp: negative cough, hemoptysis, dyspnea Cards: negative for chest pain, palpitations, lower extremity edema GU:  negative for frequency, dysuria and hematuria GI: Negative for abdominal pain, diarrhea, bleeding, constipation Skin: itchy skin, excoriations Heme: negative for easy bruising and gum/nose bleeding MS: left knee pain Neurolo:negative for headaches, dizziness, vertigo, memory problems  Psych: negative for feelings of anxiety, depression  Endocrine: negative for thyroid, diabetes Allergy/Immunology- penicillin  ? Objective:  VITALS:  BP (!) 144/80   Pulse 92   Temp (!) 97.5 F (36.4 C)   Resp 16   Ht '5\' 4"'$  (1.626 m)   Wt 103 kg   LMP  (LMP Unknown)   SpO2 100%   BMI 38.96 kg/m  PHYSICAL EXAM:  General: Alert, cooperative, no distress, appears stated age.  Head: Normocephalic, without obvious abnormality, atraumatic. Eyes: Conjunctivae clear, anicteric sclerae. Pupils are equal ENT Nares normal. No drainage or sinus tenderness. Lips, mucosa, and tongue normal. No Thrush Neck: Supple, symmetrical, no adenopathy, thyroid: non tender no carotid bruit and no JVD. Back: No CVA tenderness. Lungs: Clear to auscultation bilaterally. No Wheezing or Rhonchi. No rales. Heart: Regular rate and rhythm, no murmur, rub or gallop. Abdomen: Soft, non-tender,not distended. Bowel sounds normal. No masses Extremities: atraumatic, no cyanosis. No edema. No clubbing Skin:excoriations on the skin  of legs. Healed scar over face   Left knee surgical dressing Lymph: Cervical, supraclavicular normal. Neurologic: Grossly non-focal Pertinent Labs Lab Results CBC    Component Value Date/Time   WBC 12.6 (H) 06/07/2021 1021   RBC 3.62 (L) 06/07/2021 1021   HGB 10.8 (L) 06/07/2021 1021   HCT 31.8 (L) 06/07/2021 1021   PLT 442 (H) 06/07/2021 1021   MCV 87.8 06/07/2021 1021   MCH 29.8 06/07/2021 1021   MCHC 34.0 06/07/2021 1021   RDW 13.1 06/07/2021 1021    CMP Latest Ref Rng & Units 06/07/2021 08/07/2019  Glucose 70 - 99 mg/dL 146(H) 104(H)  BUN 6 - 20 mg/dL 12 12  Creatinine 0.44 - 1.00  mg/dL 0.78 0.78  Sodium  135 - 145 mmol/L 134(L) 140  Potassium 3.5 - 5.1 mmol/L 4.3 4.1  Chloride 98 - 111 mmol/L 96(L) 103  CO2 22 - 32 mmol/L 26 23  Calcium 8.9 - 10.3 mg/dL 9.9 9.1  Total Protein 6.0 - 8.5 g/dL - 6.9  Total Bilirubin 0.0 - 1.2 mg/dL - 0.4  Alkaline Phos 39 - 117 IU/L - 112  AST 0 - 40 IU/L - 17  ALT 0 - 32 IU/L - 17      Microbiology: Recent Results (from the past 240 hour(s))  SARS Coronavirus 2 by RT PCR (hospital order, performed in [facility] hospital lab) Nasopharyngeal Nasopharyngeal Swab     Status: None   Collection Time: 06/07/21 10:14 AM   Specimen: Nasopharyngeal Swab  Result Value Ref Range Status   SARS Coronavirus 2 NEGATIVE NEGATIVE Final    Comment: (NOTE) SARS-CoV-2 target nucleic acids are NOT DETECTED.  The SARS-CoV-2 RNA is generally detectable in upper and lower respiratory specimens during the acute phase of infection. The lowest concentration of SARS-CoV-2 viral copies this assay can detect is 250 copies / mL. A negative result does not preclude SARS-CoV-2 infection and should not be used as the sole basis for treatment or other patient management decisions.  A negative result may occur with improper specimen collection / handling, submission of specimen other than nasopharyngeal swab, presence of viral mutation(s) within the areas targeted by this assay, and inadequate number of viral copies (<250 copies / mL). A negative result must be combined with clinical observations, patient history, and epidemiological information.  Fact Sheet for Patients:   StrictlyIdeas.no  Fact Sheet for Healthcare Providers: BankingDealers.co.za  This test is not yet approved or  cleared by the [place] FDA and has been authorized for detection and/or diagnosis of SARS-CoV-2 by FDA under an Emergency Use Authorization (EUA).  This EUA will remain in effect (meaning this test can be used) for the  duration of the COVID-19 declaration under Section 564(b)(1) of the Act, 21 U.S.C. section 360bbb-3(b)(1), unless the authorization is terminated or revoked sooner.  Performed at [facility], [address] [place], [place] [address]     IMAGING RESULTS: none ? Impression/Recommendation Septic arthritis of the left knee joint.  Due to staph aureus. She needs IV cefazolin for 4 weeks. She failed oral antibiotics as outpatient.  She was given Bactrim for 2 weeks.   Hypertension ?propanalol HCTZ  HLD- on rosuvastatin  ? Gout left knee- on allopurinol  ___________________________________________________ Discussed with patient, requesting provider Note:  This document was prepared using Dragon voice recognition software and may include unintentional dictation errors.

## 2021-06-07 NOTE — Anesthesia Procedure Notes (Signed)
Procedure Name: LMA Insertion Date/Time: 06/07/2021 1:44 PM Performed by: Nelda Marseille, CRNA Pre-anesthesia Checklist: Patient identified, Patient being monitored, Timeout performed, Emergency Drugs available and Suction available Patient Re-evaluated:Patient Re-evaluated prior to induction Oxygen Delivery Method: Circle system utilized Preoxygenation: Pre-oxygenation with 100% oxygen Induction Type: IV induction Ventilation: Mask ventilation without difficulty LMA: LMA inserted LMA Size: 4.0 Tube type: Oral Number of attempts: 1 Placement Confirmation: positive ETCO2 and breath sounds checked- equal and bilateral Tube secured with: Tape Dental Injury: Teeth and Oropharynx as per pre-operative assessment

## 2021-06-07 NOTE — Transfer of Care (Signed)
Immediate Anesthesia Transfer of Care Note  Patient: Krista Blake  Procedure(s) Performed: ARTHROSCOPIC IRRIGATION AND DEBRIDEMENT (Left: Knee)  Patient Location: PACU  Anesthesia Type:General  Level of Consciousness: awake and sedated  Airway & Oxygen Therapy: Patient Spontanous Breathing and Patient connected to face mask oxygen  Post-op Assessment: Report given to RN and Post -op Vital signs reviewed and stable  Post vital signs: Reviewed and stable  Last Vitals:  Vitals Value Taken Time  BP    Temp    Pulse 101 06/07/21 1449  Resp 17 06/07/21 1449  SpO2 93 % 06/07/21 1449  Vitals shown include unvalidated device data.  Last Pain:  Vitals:   06/07/21 1053  PainSc: 6          Complications: No notable events documented.

## 2021-06-07 NOTE — Op Note (Signed)
06/07/2021  2:51 PM  Patient:   Krista Blake  Pre-Op Diagnosis:   Acute septic arthritis of left knee with underlying degenerative joint disease.  Postoperative diagnosis:   Same  Procedure:   Arthroscopic irrigation debridement with extensive debridement and abrasion chondroplasty of medial femoral condyle, lateral femoral condyle, and patella, left knee.  Surgeon:   Pascal Lux, MD  Anesthesia:   General LMA  Findings:   As above.  There were areas of extensive grade Il-lII chondromalacia involving all surfaces of each compartment with focal areas of grade IV chondromalacia involving the medial and lateral tibial plateaus and medial and lateral femoral condyles, as well as the patella.  The postsurgical findings consistent with recent partial medial and lateral meniscectomies also were noted.  The anterior and posterior cruciate ligaments both were in satisfactory condition.  Complications:   None  EBL:   5 cc.  Total fluids:   500 cc of crystalloid.  Tourniquet time:   41 minutes at 300 mmHg  Drains:   Hemovac x1  Closure:   4-0 Prolene interrupted sutures.  Brief clinical note:   The patient is a 55 year old female with a 7 to 8-week of progressively worsening left knee pain and swelling. Originally, the patient was diagnosed with gout. Because of persistent symptoms, the patient underwent an arthroscopic irrigation and debridement 5 weeks ago. Her intraoperative cultures grew out rare staph aureus. It was unclear as to whether this was a contaminant or not, so the patient was kept on oral antibiotics for 2 weeks. Over the past 1 to 2 weeks, the patient has developed progressively worsening pain in the knee. A repeat aspiration was performed which was notable for 28,000 white cells and cultures which grew out methicillin sensitive staph aureus. The patient presents at this time for an arthroscopic irrigation and debridement of her left knee.  Procedure:   The patient  was brought into the operating room and lain in the supine position. After adequate general laryngeal mask anesthesia was obtained, the left lower extremity was prepped with ChloraPrep solution before being draped sterilely. Preoperative antibiotics were administered. The expected portal sites were injected with 0.5% Sensorcaine with epinephrine before the camera was placed in the anterolateral portal and instrumentation performed through the anteromedial portal. Approximately 10 cc of bloody somewhat turbid fluid was collected through the trocar before inserting the camera. This fluid was sent for culture and sensitivity.  The knee was sequentially examined beginning in the suprapatellar pouch, then progressing to the patellofemoral space, the medial gutter and compartment, the notch, and finally the lateral compartment and gutter. The findings were as described above. Abundant reactive synovial tissues anteriorly, medially, laterally, and posteriorly in both the medial and lateral compartments were debrided using the full-radius resector. The knee was irrigated with 3 L of plain lactated Ringer's solution, then 3 L of antibiotic irrigation followed by 3 more liters of sterile lactated Ringer's solution. A single limb to a Hemovac drain was placed through the trocar into the joint before the instruments were removed from the joint after suctioning the excess fluid.   The portal sites were closed using 4-0 Prolene interrupted sutures before a sterile bulky dressing was applied to the knee. The patient was then awakened, extubated, and returned to the recovery room in satisfactory condition after tolerating the procedure well.

## 2021-06-07 NOTE — Anesthesia Postprocedure Evaluation (Signed)
Anesthesia Post Note  Patient: Krista Blake  Procedure(s) Performed: ARTHROSCOPIC IRRIGATION AND DEBRIDEMENT (Left: Knee)  Patient location during evaluation: PACU Anesthesia Type: General Level of consciousness: awake and alert Pain management: pain level controlled Vital Signs Assessment: post-procedure vital signs reviewed and stable Respiratory status: spontaneous breathing, nonlabored ventilation, respiratory function stable and patient connected to nasal cannula oxygen Cardiovascular status: blood pressure returned to baseline and stable Postop Assessment: no apparent nausea or vomiting Anesthetic complications: no   No notable events documented.   Last Vitals:  Vitals:   06/07/21 1500 06/07/21 1515  BP: 133/80 (!) 148/77  Pulse: 98 (!) 101  Resp: 12 15  Temp:    SpO2: 98% 92%    Last Pain:  Vitals:   06/07/21 1449  PainSc: Asleep                 Arita Miss

## 2021-06-07 NOTE — Anesthesia Preprocedure Evaluation (Signed)
Anesthesia Evaluation  Patient identified by MRN, date of birth, ID band Patient awake    Reviewed: Allergy & Precautions, NPO status , Patient's Chart, lab work & pertinent test results  History of Anesthesia Complications Negative for: history of anesthetic complications  Airway Mallampati: III  TM Distance: >3 FB Neck ROM: Full    Dental no notable dental hx. (+) Teeth Intact   Pulmonary neg pulmonary ROS, neg sleep apnea, neg COPD, Patient abstained from smoking.Not current smoker,    Pulmonary exam normal breath sounds clear to auscultation       Cardiovascular Exercise Tolerance: Good METShypertension, Pt. on medications (-) CAD and (-) Past MI (-) dysrhythmias  Rhythm:Regular Rate:Normal - Systolic murmurs    Neuro/Psych negative neurological ROS  negative psych ROS   GI/Hepatic neg GERD  ,(+)     (-) substance abuse  ,   Endo/Other  neg diabetes  Renal/GU negative Renal ROS     Musculoskeletal   Abdominal   Peds  Hematology   Anesthesia Other Findings Past Medical History: No date: Bulging lumbar disc 03/04/2015: History of mammogram     Comment:  BIRAD 1 02/09/2016: History of Papanicolaou smear of cervix     Comment:  NIL/NEG No date: Hypertension 02/05/2015: Pap smear abnormality of cervix/human papillomavirus  (HPV) positive     Comment:  NIL Pap with positive HPV aptima.  HPV 16,18.45 neg 07/2018: Screening for colon cancer     Comment:  neg Cologuard; repeat in 3 yrs  Reproductive/Obstetrics                             Anesthesia Physical Anesthesia Plan  ASA: 2  Anesthesia Plan: General   Post-op Pain Management:    Induction: Intravenous  PONV Risk Score and Plan: 3 and Ondansetron, Dexamethasone and Midazolam  Airway Management Planned: LMA  Additional Equipment: None  Intra-op Plan:   Post-operative Plan: Extubation in OR  Informed Consent: I  have reviewed the patients History and Physical, chart, labs and discussed the procedure including the risks, benefits and alternatives for the proposed anesthesia with the patient or authorized representative who has indicated his/her understanding and acceptance.     Dental advisory given  Plan Discussed with: CRNA and Surgeon  Anesthesia Plan Comments: (Discussed risks of anesthesia with patient, including PONV, sore throat, lip/dental damage. Rare risks discussed as well, such as cardiorespiratory and neurological sequelae, and allergic reactions. Patient understands. Patient has listed allergy to PCN - "hives during college". Has taken other beta lactams before. Severe blistering skin reaction (SJS/TEN)? no Liver or kidney injury caused by PCN? no Hemolytic anemia from PCN? no Drug fever? no Painful swollen joints? no Severe reaction involving inside of mouth, eye, or genital ulcers? no Based on current evidence Alfonse Alpers et al, J Allergy Clin Immunol Pract, 2019), will proceed with cefazolin use: Yes  )        Anesthesia Quick Evaluation

## 2021-06-08 ENCOUNTER — Encounter: Payer: Self-pay | Admitting: Surgery

## 2021-06-08 ENCOUNTER — Observation Stay: Payer: Self-pay

## 2021-06-08 DIAGNOSIS — E785 Hyperlipidemia, unspecified: Secondary | ICD-10-CM | POA: Diagnosis present

## 2021-06-08 DIAGNOSIS — Z791 Long term (current) use of non-steroidal anti-inflammatories (NSAID): Secondary | ICD-10-CM | POA: Diagnosis not present

## 2021-06-08 DIAGNOSIS — I10 Essential (primary) hypertension: Secondary | ICD-10-CM | POA: Diagnosis present

## 2021-06-08 DIAGNOSIS — M00062 Staphylococcal arthritis, left knee: Secondary | ICD-10-CM | POA: Diagnosis present

## 2021-06-08 DIAGNOSIS — M25562 Pain in left knee: Secondary | ICD-10-CM | POA: Diagnosis present

## 2021-06-08 DIAGNOSIS — Z8249 Family history of ischemic heart disease and other diseases of the circulatory system: Secondary | ICD-10-CM | POA: Diagnosis not present

## 2021-06-08 DIAGNOSIS — B9561 Methicillin susceptible Staphylococcus aureus infection as the cause of diseases classified elsewhere: Secondary | ICD-10-CM | POA: Diagnosis present

## 2021-06-08 DIAGNOSIS — M1712 Unilateral primary osteoarthritis, left knee: Secondary | ICD-10-CM | POA: Diagnosis present

## 2021-06-08 DIAGNOSIS — Z7952 Long term (current) use of systemic steroids: Secondary | ICD-10-CM | POA: Diagnosis not present

## 2021-06-08 DIAGNOSIS — M109 Gout, unspecified: Secondary | ICD-10-CM | POA: Diagnosis present

## 2021-06-08 DIAGNOSIS — Z79899 Other long term (current) drug therapy: Secondary | ICD-10-CM | POA: Diagnosis not present

## 2021-06-08 DIAGNOSIS — Z8619 Personal history of other infectious and parasitic diseases: Secondary | ICD-10-CM | POA: Diagnosis not present

## 2021-06-08 DIAGNOSIS — Z20822 Contact with and (suspected) exposure to covid-19: Secondary | ICD-10-CM | POA: Diagnosis present

## 2021-06-08 DIAGNOSIS — Z880 Allergy status to penicillin: Secondary | ICD-10-CM | POA: Diagnosis not present

## 2021-06-08 LAB — CBC
HCT: 27.3 % — ABNORMAL LOW (ref 36.0–46.0)
Hemoglobin: 9.1 g/dL — ABNORMAL LOW (ref 12.0–15.0)
MCH: 29.4 pg (ref 26.0–34.0)
MCHC: 33.3 g/dL (ref 30.0–36.0)
MCV: 88.1 fL (ref 80.0–100.0)
Platelets: 382 10*3/uL (ref 150–400)
RBC: 3.1 MIL/uL — ABNORMAL LOW (ref 3.87–5.11)
RDW: 13.2 % (ref 11.5–15.5)
WBC: 14.1 10*3/uL — ABNORMAL HIGH (ref 4.0–10.5)
nRBC: 0 % (ref 0.0–0.2)

## 2021-06-08 LAB — BASIC METABOLIC PANEL
Anion gap: 8 (ref 5–15)
BUN: 15 mg/dL (ref 6–20)
CO2: 26 mmol/L (ref 22–32)
Calcium: 8.7 mg/dL — ABNORMAL LOW (ref 8.9–10.3)
Chloride: 101 mmol/L (ref 98–111)
Creatinine, Ser: 0.84 mg/dL (ref 0.44–1.00)
GFR, Estimated: >60 mL/min (ref >60)
Glucose, Bld: 235 mg/dL — ABNORMAL HIGH (ref 70–99)
Potassium: 4.5 mmol/L (ref 3.5–5.1)
Sodium: 135 mmol/L (ref 135–145)

## 2021-06-08 LAB — C-REACTIVE PROTEIN: CRP: 24.3 mg/dL — ABNORMAL HIGH (ref <1.0)

## 2021-06-08 LAB — SEDIMENTATION RATE: Sed Rate: 129 mm/hr — ABNORMAL HIGH (ref 0–30)

## 2021-06-08 MED ORDER — CEFAZOLIN SODIUM-DEXTROSE 2-4 GM/100ML-% IV SOLN
2.0000 g | Freq: Three times a day (TID) | INTRAVENOUS | Status: DC
  Administered 2021-06-08 – 2021-06-10 (×6): 2 g via INTRAVENOUS
  Filled 2021-06-08 (×6): qty 100

## 2021-06-08 MED ORDER — SODIUM CHLORIDE 0.9% FLUSH
10.0000 mL | Freq: Two times a day (BID) | INTRAVENOUS | Status: DC
  Administered 2021-06-08 – 2021-06-10 (×4): 10 mL

## 2021-06-08 MED ORDER — CHLORHEXIDINE GLUCONATE CLOTH 2 % EX PADS
6.0000 | MEDICATED_PAD | Freq: Every day | CUTANEOUS | Status: DC
  Administered 2021-06-08 – 2021-06-10 (×3): 6 via TOPICAL

## 2021-06-08 MED ORDER — SODIUM CHLORIDE 0.9% FLUSH: 10.0000 mL | INTRAVENOUS | Status: DC | PRN

## 2021-06-08 NOTE — Progress Notes (Signed)
Peripherally Inserted Central Catheter Placement  The IV Nurse has discussed with the patient and/or persons authorized to consent for the patient, the purpose of this procedure and the potential benefits and risks involved with this procedure.  The benefits include less needle sticks, lab draws from the catheter, and the patient may be discharged home with the catheter. Risks include, but not limited to, infection, bleeding, blood clot (thrombus formation), and puncture of an artery; nerve damage and irregular heartbeat and possibility to perform a PICC exchange if needed/ordered by physician.  Alternatives to this procedure were also discussed.  Bard Power PICC patient education guide, fact sheet on infection prevention and patient information card has been provided to patient /or left at bedside.    PICC Placement Documentation  PICC Single Lumen 0000000 Left Basilic 37 cm 0 cm (Active)  Indication for Insertion or Continuance of Line Home intravenous therapies (PICC only) 06/08/21 1611  Exposed Catheter (cm) 0 cm 06/08/21 1611  Site Assessment Clean;Dry;Intact 06/08/21 1611  Line Status Flushed;Blood return noted 06/08/21 1611  Dressing Type Transparent 06/08/21 1611  Dressing Status Clean;Dry;Intact 06/08/21 1611  Antimicrobial disc in place? Yes 06/08/21 1611  Dressing Intervention New dressing;Other (Comment) 06/08/21 1611  Dressing Change Due 06/15/21 06/08/21 1611       Krista Blake 06/08/2021, 4:15 PM

## 2021-06-08 NOTE — Plan of Care (Signed)
Patient alert and oriented x 4, complains of pain to left lower leg, verbalized some relief with prn pain medications. Foam therapy remains inplace to extremity. Vitals stable, no respiratory distress on room air. Refused bisacodyl, request to take later during the day if no bm. Stable condition at end of shift.  Problem: Education: Goal: Knowledge of General Education information will improve Description: Including pain rating scale, medication(s)/side effects and non-pharmacologic comfort measures Outcome: Progressing   Problem: Health Behavior/Discharge Planning: Goal: Ability to manage health-related needs will improve Outcome: Progressing   Problem: Clinical Measurements: Goal: Ability to maintain clinical measurements within normal limits will improve Outcome: Progressing Goal: Will remain free from infection Outcome: Progressing Goal: Diagnostic test results will improve Outcome: Progressing Goal: Respiratory complications will improve Outcome: Progressing Goal: Cardiovascular complication will be avoided Outcome: Progressing   Problem: Activity: Goal: Risk for activity intolerance will decrease Outcome: Progressing   Problem: Nutrition: Goal: Adequate nutrition will be maintained Outcome: Progressing   Problem: Elimination: Goal: Will not experience complications related to bowel motility Outcome: Progressing Goal: Will not experience complications related to urinary retention Outcome: Progressing   Problem: Pain Managment: Goal: General experience of comfort will improve Outcome: Progressing   Problem: Safety: Goal: Ability to remain free from injury will improve Outcome: Progressing   Problem: Skin Integrity: Goal: Risk for impaired skin integrity will decrease Outcome: Progressing

## 2021-06-08 NOTE — Progress Notes (Signed)
  Subjective: 1 Day Post-Op Procedure(s) (LRB): ARTHROSCOPIC IRRIGATION AND DEBRIDEMENT (Left) Patient reports pain as mild.   Patient is well, and has had no acute complaints or problems Denies any chills or sweats. Plan is to go Home after hospital stay. Negative for chest pain and shortness of breath Fever: no Gastrointestinal:Negative for nausea and vomiting  Objective: Vital signs in last 24 hours: Temp:  [98 F (36.7 C)-98.9 F (37.2 C)] 98.2 F (36.8 C) (08/17 1147) Pulse Rate:  [75-103] 82 (08/17 1147) Resp:  [12-27] 16 (08/17 1147) BP: (105-158)/(59-81) 114/65 (08/17 1147) SpO2:  [91 %-99 %] 96 % (08/17 1147)  Intake/Output from previous day:  Intake/Output Summary (Last 24 hours) at 06/08/2021 1209 Last data filed at 06/08/2021 0926 Gross per 24 hour  Intake 2347.43 ml  Output 113 ml  Net 2234.43 ml    Intake/Output this shift: Total I/O In: 600 [P.O.:600] Out: -   Labs: Recent Labs    06/07/21 1021 06/08/21 0442  HGB 10.8* 9.1*   Recent Labs    06/07/21 1021 06/08/21 0442  WBC 12.6* 14.1*  RBC 3.62* 3.10*  HCT 31.8* 27.3*  PLT 442* 382   Recent Labs    06/07/21 1034 06/08/21 0442  NA 134* 135  K 4.3 4.5  CL 96* 101  CO2 26 26  BUN 12 15  CREATININE 0.78 0.84  GLUCOSE 146* 235*  CALCIUM 9.9 8.7*   No results for input(s): LABPT, INR in the last 72 hours.   EXAM General - Patient is Alert, Appropriate, and Oriented Extremity - ABD soft Sensation intact distally Intact pulses distally Dorsiflexion/Plantar flexion intact Incision: ACE wrap applied without drainage No cellulitis present Dressing/Incision - clean, dry, hemovac with mild bloody drainage this AM. Motor Function - intact, moving foot and toes well on exam.  Abdomen soft with normal bowel sounds.  Past Medical History:  Diagnosis Date   Bulging lumbar disc    History of mammogram 03/04/2015   BIRAD 1   History of Papanicolaou smear of cervix 02/09/2016   NIL/NEG    Hypertension    Pap smear abnormality of cervix/human papillomavirus (HPV) positive 02/05/2015   NIL Pap with positive HPV aptima.  HPV 16,18.45 neg   Screening for colon cancer 07/2018   neg Cologuard; repeat in 3 yrs    Assessment/Plan: 1 Day Post-Op Procedure(s) (LRB): ARTHROSCOPIC IRRIGATION AND DEBRIDEMENT (Left) Active Problems:   Septic arthritis of knee, left (HCC)  Estimated body mass index is 38.96 kg/m as calculated from the following:   Height as of this encounter: '5\' 4"'$  (1.626 m).   Weight as of this encounter: 103 kg. Advance diet Up with therapy D/C IV fluids when tolerating po intake.  Labs reviewed this AM, WBC 14.1, no recent fevers. Cultures from surgery pending at this time. ID consulted, currently on IV Ancef.  Will need IV Abx. Will place order for PICC line placement. Up with therapy today. Possible removal of Hemovac tomorrow.  DVT Prophylaxis - Lovenox and Foot Pumps Weight-Bearing as tolerated to left leg  J. Cameron Proud, PA-C [facility] 06/08/2021, 12:09 PM

## 2021-06-08 NOTE — Evaluation (Signed)
Physical Therapy Evaluation Patient Details Name: Krista Blake MRN: [id number] DOB: 1965/12/29 Today's Date: 06/08/2021   History of Present Illness  55 y.o. female s/p I&D of L knee.  She is 5 weeks status post a arthroscopic irrigation debridement of her left knee. Post prior surgery she initially did well but had more and more swelling and needed increased AD use and ROM/swelling issues in the knee.  A repeat aspiration was performed last week. Ultimately repeat cultures showed infection and need for further I&D.  Clinical Impression  Pt showed great effort with PT exam and subsequent exercises and gait training.  She does have considerable swelling/stiffness in the R knee with ROM limited 15-60 and pain with even minimal overpressure.  She was able to ambulate 250 ft and negotiated steps w/o rails with good safety and awareness.      Follow Up Recommendations Follow surgeon's recommendation for DC plan and follow-up therapies (HHPT vs outpt)    Equipment Recommendations  Rolling walker with 5" wheels    Recommendations for Other Services       Precautions / Restrictions Precautions Precautions: Fall Restrictions Weight Bearing Restrictions: No      Mobility  Bed Mobility Overal bed mobility: Modified Independent             General bed mobility comments: Pt able to get herself to EOB, pain hesitant but did not need assist    Transfers Overall transfer level: Modified independent               General transfer comment: Pt was able to rise to standing w/o assist, cues for UE use and sequencing  Ambulation/Gait Ambulation/Gait assistance: Supervision Gait Distance (Feet): 250 Feet Assistive device: Rolling walker (2 wheeled)       General Gait Details: Pt was able to ambulate with some hesitation with R WBing, inabiltiy to fully extend knee though she was able to control any mild buckling.  She was able to maintain good cadence and did not show any  LOBs, O2 in the 90s t/o the effort.  Stairs Stairs: Yes Stairs assistance: Supervision Stair Management: Backwards;Step to pattern;With walker Number of Stairs: 4 General stair comments: Pt was able to negotiate steps w/o rails with plenty of cuing but no need for phyiscal assist apart from stabilizing walker  Wheelchair Mobility    Modified Rankin (Stroke Patients Only)       Balance Overall balance assessment: Modified Independent                                           Pertinent Vitals/Pain Pain Assessment: 0-10 Pain Score: 4  Pain Location: R knee, mostly medially    Home Living Family/patient expects to be discharged to:: Private residence Living Arrangements: Alone Available Help at Discharge: Family;Available 24 hours/day (will be staying at her parents' home at d/c)   Home Access: Stairs to enter Entrance Stairs-Rails: None Entrance Stairs-Number of Steps: 4   Home Equipment: [place] - 4 wheels;Cane - single point;Crutches      Prior Function Level of Independence: Independent         Comments: Pt has needed ADs recently due to pain/swelling/ROM issues     Hand Dominance        Extremity/Trunk Assessment   Upper Extremity Assessment Upper Extremity Assessment: Overall WFL for tasks assessed    Lower Extremity Assessment Lower Extremity  Assessment: LLE deficits/detail LLE Deficits / Details: unable to SLR, ROM 15-60, pain with most movement       Communication   Communication: No difficulties  Cognition Arousal/Alertness: Awake/alert Behavior During Therapy: WFL for tasks assessed/performed Overall Cognitive Status: Within Functional Limits for tasks assessed                                        General Comments General comments (skin integrity, edema, etc.): Pt with swollen stiff knee, but did well with functional mobility and ambulation    Exercises General Exercises - Lower Extremity Ankle  Circles/Pumps: AROM;10 reps Quad Sets: Strengthening;10 reps Heel Slides: 10 reps;AAROM (gentle knee flexion overpressure and resisted leg ext) Hip ABduction/ADduction: AROM;10 reps Straight Leg Raises: AAROM;5 reps   Assessment/Plan    PT Assessment Patient needs continued PT services  PT Problem List Decreased strength;Decreased range of motion;Decreased activity tolerance;Decreased balance;Decreased mobility;Decreased knowledge of use of DME;Decreased safety awareness;Pain       PT Treatment Interventions DME instruction;Gait training;Stair training;Functional mobility training;Therapeutic activities;Therapeutic exercise;Balance training;Neuromuscular re-education;Patient/family education    PT Goals (Current goals can be found in the Care Plan section)  Acute Rehab PT Goals Patient Stated Goal: go home and get knee moving well again PT Goal Formulation: With patient Time For Goal Achievement: 06/22/21 Potential to Achieve Goals: Good    Frequency 7X/week   Barriers to discharge        Co-evaluation               AM-PAC PT "6 Clicks" Mobility  Outcome Measure Help needed turning from your back to your side while in a flat bed without using bedrails?: None Help needed moving from lying on your back to sitting on the side of a flat bed without using bedrails?: None Help needed moving to and from a bed to a chair (including a wheelchair)?: None Help needed standing up from a chair using your arms (e.g., wheelchair or bedside chair)?: None Help needed to walk in hospital room?: A Little Help needed climbing 3-5 steps with a railing? : A Little 6 Click Score: 22    End of Session Equipment Utilized During Treatment: Gait belt Activity Tolerance: Patient tolerated treatment well;Patient limited by pain Patient left: with chair alarm set;with call bell/phone within reach Nurse Communication: Mobility status PT Visit Diagnosis: Muscle weakness (generalized)  (M62.81);Difficulty in walking, not elsewhere classified (R26.2)    Time: [id number] PT Time Calculation (min) (ACUTE ONLY): 28 min   Charges:   PT Evaluation $PT Eval Low Complexity: 1 Low PT Treatments $Gait Training: 8-22 mins $Therapeutic Exercise: 8-22 mins        Kreg Shropshire, DPT 06/08/2021, 11:29 AM

## 2021-06-08 NOTE — TOC Progression Note (Addendum)
Transition of Care [facility]) - Progression Note    Patient Details  Name: Krista Blake MRN: [id number] Date of Birth: [birth year]  Transition of Care [facility]) CM/SW [place], RN Phone Number: 06/08/2021, 4:27 PM  Clinical Narrative:      Spoke with the patient in the room at the bedside, she has a rolator at home but will need a RW and a 3 in 1, I notified [employer] to bring to her prior to DC She will need 4 weeks of IV ABX, I spoke to P_am at [facility] and set it up, they will come tomorrow and do the teaching, am requesting [name] to accept the patient for [facility] pt and nursing, awaiting a responce  [name] will do the PT portion and [facility] will do the Nursing portion of [facility]    Expected Discharge Plan and Services                                                 Social Determinants of Health (SDOH) Interventions    Readmission Risk Interventions No flowsheet data found.

## 2021-06-09 LAB — CBC
HCT: 27.7 % — ABNORMAL LOW (ref 36.0–46.0)
Hemoglobin: 9.1 g/dL — ABNORMAL LOW (ref 12.0–15.0)
MCH: 29.2 pg (ref 26.0–34.0)
MCHC: 32.9 g/dL (ref 30.0–36.0)
MCV: 88.8 fL (ref 80.0–100.0)
Platelets: 415 10*3/uL — ABNORMAL HIGH (ref 150–400)
RBC: 3.12 MIL/uL — ABNORMAL LOW (ref 3.87–5.11)
RDW: 13.3 % (ref 11.5–15.5)
WBC: 11.2 10*3/uL — ABNORMAL HIGH (ref 4.0–10.5)
nRBC: 0 % (ref 0.0–0.2)

## 2021-06-09 LAB — BASIC METABOLIC PANEL
Anion gap: 8 (ref 5–15)
BUN: 18 mg/dL (ref 6–20)
CO2: 30 mmol/L (ref 22–32)
Calcium: 8.7 mg/dL — ABNORMAL LOW (ref 8.9–10.3)
Chloride: 99 mmol/L (ref 98–111)
Creatinine, Ser: 0.76 mg/dL (ref 0.44–1.00)
GFR, Estimated: >60 mL/min (ref >60)
Glucose, Bld: 127 mg/dL — ABNORMAL HIGH (ref 70–99)
Potassium: 4 mmol/L (ref 3.5–5.1)
Sodium: 137 mmol/L (ref 135–145)

## 2021-06-09 MED ORDER — DIPHENHYDRAMINE HCL 25 MG PO CAPS: 25.0000 mg | ORAL_CAPSULE | Freq: Four times a day (QID) | ORAL | Status: DC | PRN

## 2021-06-09 NOTE — Progress Notes (Signed)
   Date of Admission:  06/07/2021     Active Problems:   Septic arthritis of knee, left (HCC)    Subjective: Patient says she is feeling better Pain left leg better Less cramping No fever  Medications:   allopurinol  100 mg Oral Daily   Chlorhexidine Gluconate Cloth  6 each Topical Daily   docusate sodium  100 mg Oral BID   enoxaparin (LOVENOX) injection  40 mg Subcutaneous Q24H   hydrochlorothiazide  25 mg Oral Daily   magnesium oxide  400 mg Oral Daily   PARoxetine  20 mg Oral Daily   propranolol  20 mg Oral Daily   rosuvastatin  10 mg Oral Daily   sodium chloride flush  10-40 mL Intracatheter Q12H    Objective: Vital signs in last 24 hours: Temp:  [97.8 F (36.6 C)-98.9 F (37.2 C)] 98.4 F (36.9 C) (08/18 1215) Pulse Rate:  [68-81] 79 (08/18 1215) Resp:  [16-17] 16 (08/18 1215) BP: (120-138)/(68-87) 127/83 (08/18 1215) SpO2:  [94 %-97 %] 95 % (08/18 1215)  PHYSICAL EXAM:  General: Alert, cooperative, no distress, appears stated age.  Head: Normocephalic, without obvious abnormality, atraumatic. Eyes: Conjunctivae clear, anicteric sclerae. Pupils are equal ENT Nares normal. No drainage or sinus tenderness. Lips, mucosa, and tongue normal. No Thrush Neck: Supple, symmetrical, no adenopathy, thyroid: non tender no carotid bruit and no JVD. Back: No CVA tenderness. Lungs: Clear to auscultation bilaterally. No Wheezing or Rhonchi. No rales. Heart: Regular rate and rhythm, no murmur, rub or gallop. Abdomen: Soft, non-tender,not distended. Bowel sounds normal. No masses Extremities: Right PICC line present Left knee surgical dressing present skin: No rashes or lesions. Or bruising Lymph: Cervical, supraclavicular normal. Neurologic: Grossly non-focal  Lab Results Recent Labs    06/08/21 0442 06/09/21 0445  WBC 14.1* 11.2*  HGB 9.1* 9.1*  HCT 27.3* 27.7*  NA 135 137  K 4.5 4.0  CL 101 99  CO2 26 30  BUN 15 18  CREATININE 0.84 0.76   Liver Panel No  results for input(s): PROT, ALBUMIN, AST, ALT, ALKPHOS, BILITOT, BILIDIR, IBILI in the last 72 hours. Sedimentation Rate Recent Labs    06/08/21 0442  ESRSEDRATE 129*   C-Reactive Protein Recent Labs    06/08/21 0442  CRP 24.3*    Microbiology: Left knee synovial fluid culture staff aureus Studies/Results: [place] EKG SITE RITE  Result Date: 06/08/2021 If Site Rite image not attached, placement could not be confirmed due to current cardiac rhythm.    Assessment/Plan: Staff aureus septic arthritis of the left knee joint.  She is currently on IV cefazolin and will need it for 4 weeks until 07/08/2021.  OPAT note has been placed.  Will follow her as outpatient   Hypertension on propanolol and HCTZ  Hyperlipidemia on rosuvastatin  Gout left knee on allopurinol.  Discussed the management with the patient in great detail.

## 2021-06-09 NOTE — Progress Notes (Signed)
  Subjective: 2 Days Post-Op Procedure(s) (LRB): ARTHROSCOPIC IRRIGATION AND DEBRIDEMENT (Left) Patient reports pain as mild-moderate.  Patient is well, and has had no acute complaints or problems Denies any chills or sweats. Plan is to go Home after hospital stay. Negative for chest pain and shortness of breath Fever: no Gastrointestinal:Negative for nausea and vomiting Culture from surgery grew rare staph aureus  Objective: Vital signs in last 24 hours: Temp:  [97.8 F (36.6 C)-98.9 F (37.2 C)] 98.4 F (36.9 C) (08/18 1215) Pulse Rate:  [68-81] 79 (08/18 1215) Resp:  [16-17] 16 (08/18 1215) BP: (120-138)/(68-87) 127/83 (08/18 1215) SpO2:  [94 %-97 %] 95 % (08/18 1215)  Intake/Output from previous day:  Intake/Output Summary (Last 24 hours) at 06/09/2021 1501 Last data filed at 06/09/2021 0300 Gross per 24 hour  Intake 901.39 ml  Output 0 ml  Net 901.39 ml    Intake/Output this shift: No intake/output data recorded.  Labs: Recent Labs    06/07/21 1021 06/08/21 0442 06/09/21 0445  HGB 10.8* 9.1* 9.1*   Recent Labs    06/08/21 0442 06/09/21 0445  WBC 14.1* 11.2*  RBC 3.10* 3.12*  HCT 27.3* 27.7*  PLT 382 415*   Recent Labs    06/08/21 0442 06/09/21 0445  NA 135 137  K 4.5 4.0  CL 101 99  CO2 26 30  BUN 15 18  CREATININE 0.84 0.76  GLUCOSE 235* 127*  CALCIUM 8.7* 8.7*   No results for input(s): LABPT, INR in the last 72 hours.   EXAM General - Patient is Alert, Appropriate, and Oriented Extremity - ABD soft Sensation intact distally Intact pulses distally Dorsiflexion/Plantar flexion intact Incision: ACE wrap applied without drainage No cellulitis present Dressing/Incision - clean, dry, hemovac with mild bloody drainage this AM.  ACE wrap was removed and hemovac removed without complication.  Swelling improving to the knee, no erythema or heat with palpation. Motor Function - intact, moving foot and toes well on exam.  Abdomen soft with  normal bowel sounds.  Past Medical History:  Diagnosis Date   Bulging lumbar disc    History of mammogram 03/04/2015   BIRAD 1   History of Papanicolaou smear of cervix 02/09/2016   NIL/NEG   Hypertension    Pap smear abnormality of cervix/human papillomavirus (HPV) positive 02/05/2015   NIL Pap with positive HPV aptima.  HPV 16,18.45 neg   Screening for colon cancer 07/2018   neg Cologuard; repeat in 3 yrs   Assessment/Plan: 2 Days Post-Op Procedure(s) (LRB): ARTHROSCOPIC IRRIGATION AND DEBRIDEMENT (Left) Active Problems:   Septic arthritis of knee, left (HCC)  Estimated body mass index is 38.96 kg/m as calculated from the following:   Height as of this encounter: '5\' 4"'$  (1.626 m).   Weight as of this encounter: 103 kg. Advance diet Up with therapy  Labs reviewed this AM, WBC trending down to 11.2 this AM. Cultures from surgery grew rare Staph Aureus. ID consulted, currently on IV Ancef.  Will need IV Abx.  PICC line placed yesterday. Hemovac was removed today. Up with therapy today. Will discuss with ID, will plan for discharge home with IV antibiotics tomorrow.  DVT Prophylaxis - Lovenox and Foot Pumps Weight-Bearing as tolerated to left leg  J. Cameron Proud, PA-C [facility] 06/09/2021, 3:01 PM

## 2021-06-09 NOTE — Plan of Care (Signed)
Patient alert and oriented x 4 with periods of intermittent confusion,  complains of pain to left lower leg, verbalized some relief with prn pain medications. Bone foam remains inplace to extremity with periods of not wearing per patients request. Vitals stable, no respiratory distress on room air. Left lower extremity iced per orders. Right upper arm PICC patient and dressing clean, dry and was reinforced. Stable condition at end of shift.  Problem: Education: Goal: Knowledge of General Education information will improve Description: Including pain rating scale, medication(s)/side effects and non-pharmacologic comfort measures Outcome: Progressing   Problem: Health Behavior/Discharge Planning: Goal: Ability to manage health-related needs will improve Outcome: Progressing   Problem: Clinical Measurements: Goal: Ability to maintain clinical measurements within normal limits will improve Outcome: Progressing Goal: Will remain free from infection Outcome: Progressing Goal: Diagnostic test results will improve Outcome: Progressing Goal: Respiratory complications will improve Outcome: Progressing Goal: Cardiovascular complication will be avoided Outcome: Progressing   Problem: Activity: Goal: Risk for activity intolerance will decrease Outcome: Progressing   Problem: Nutrition: Goal: Adequate nutrition will be maintained Outcome: Progressing   Problem: Coping: Goal: Level of anxiety will decrease Outcome: Progressing   Problem: Elimination: Goal: Will not experience complications related to bowel motility Outcome: Progressing Goal: Will not experience complications related to urinary retention Outcome: Progressing   Problem: Safety: Goal: Ability to remain free from injury will improve Outcome: Progressing   Problem: Skin Integrity: Goal: Risk for impaired skin integrity will decrease Outcome: Progressing

## 2021-06-09 NOTE — Progress Notes (Signed)
Physical Therapy Treatment Patient Details Name: Krista Blake MRN: [id number] DOB: 22-Sep-1966 Today's Date: 06/09/2021    History of Present Illness 55 y.o. female s/p I&D of L knee.  She is 5 weeks status post a arthroscopic irrigation debridement of her left knee. Post prior surgery she initially did well but had more and more swelling and needed increased AD use and ROM/swelling issues in the knee.  A repeat aspiration was performed last week. Ultimately repeat cultures showed infection and need for further I&D.    PT Comments    Pt continues to be very motivated and eager to do all she can with PT.  She continues to have significant stiffness in knee but did have improved ROM numbers as compared to yesterday (12*-67*).  Pt was very good about keeping her leg in the bone foam most of the day yesterday and also reports that she consistently did quad sets t/o the day.  Pt still needed plenty of cuing during DISH trying to work toward extension.  Pt safe with mobility but still with some WBing hesitancy/reliance on the walker.  Follow Up Recommendations  Follow surgeon's recommendation for DC plan and follow-up therapies     Equipment Recommendations  Rolling walker with 5" wheels    Recommendations for Other Services       Precautions / Restrictions Precautions Precautions: Fall Restrictions Weight Bearing Restrictions: No    Mobility  Bed Mobility Overal bed mobility: Modified Independent                  Transfers Overall transfer level: Modified independent Equipment used: Rolling walker (2 wheeled)             General transfer comment: Pt was able to rise to standing w/o assist, cues for UE use and sequencing  Ambulation/Gait Ambulation/Gait assistance: Supervision Gait Distance (Feet): 200 Feet Assistive device: Rolling walker (2 wheeled)       General Gait Details: Less hesitation with R WBing this date, did focus on actively trying to  maximize available knee extension, no buckling but she does continue to lack TKE.  Appropriate RW reliance t/o the effort.   Stairs             Wheelchair Mobility    Modified Rankin (Stroke Patients Only)       Balance Overall balance assessment: Modified Independent                                          Cognition Arousal/Alertness: Awake/alert Behavior During Therapy: WFL for tasks assessed/performed Overall Cognitive Status: Within Functional Limits for tasks assessed                                        Exercises General Exercises - Lower Extremity Ankle Circles/Pumps: AROM;10 reps Quad Sets: Strengthening;10 reps Short Arc Quad: 10 reps;AROM Heel Slides: 10 reps;AAROM (gentle overpressure at end range flexion/extension) Hip ABduction/ADduction: 10 reps;Strengthening Straight Leg Raises: AAROM;10 reps Hip Flexion/Marching: AROM;Strengthening    General Comments        Pertinent Vitals/Pain Pain Assessment: 0-10 Pain Score: 7  Pain Location: R knee, mostly medially    Home Living                      Prior  Function            PT Goals (current goals can now be found in the care plan section) Progress towards PT goals: Not progressing toward goals - comment    Frequency    7X/week      PT Plan Current plan remains appropriate    Co-evaluation              AM-PAC PT "6 Clicks" Mobility   Outcome Measure  Help needed turning from your back to your side while in a flat bed without using bedrails?: None Help needed moving from lying on your back to sitting on the side of a flat bed without using bedrails?: None Help needed moving to and from a bed to a chair (including a wheelchair)?: None Help needed standing up from a chair using your arms (e.g., wheelchair or bedside chair)?: None Help needed to walk in hospital room?: A Little Help needed climbing 3-5 steps with a railing? : A  Little 6 Click Score: 22    End of Session Equipment Utilized During Treatment: Gait belt Activity Tolerance: Patient tolerated treatment well;Patient limited by pain Patient left: with chair alarm set;with call bell/phone within reach Nurse Communication: Mobility status PT Visit Diagnosis: Muscle weakness (generalized) (M62.81);Difficulty in walking, not elsewhere classified (R26.2)     Time: [id number] PT Time Calculation (min) (ACUTE ONLY): 43 min  Charges:  $Gait Training: 8-22 mins $Therapeutic Exercise: 23-37 mins                     Kreg Shropshire, DPT 06/09/2021, 12:47 PM

## 2021-06-09 NOTE — Treatment Plan (Signed)
Diagnosis: MSSA Septic arthritis Baseline Creatinine <1   Allergies  Allergen Reactions   Penicillins Hives    Reaction : Hives during college TOLERATED CEFAZOLIN AND AMOXICILLIN    OPAT Orders Cefazolin 2 grams IV every 8 hours End Date: 07/06/21  [facility] Care Per Protocol:  Labs weekly Monday  while on IV antibiotics: __X CBC with differential __ BMP __X CMP __X CRP __X ESR   X__ Please pull PIC at completion of IV antibiotics _  Fax weekly labs promptly  to [phone]  Clinic Follow Up Appt: 06/28/21 at 11.15am   Call [phone] with any questions

## 2021-06-09 NOTE — Progress Notes (Signed)
PHARMACY CONSULT NOTE FOR:  OUTPATIENT  PARENTERAL ANTIBIOTIC THERAPY (OPAT)  Indication: MSSA septic arthritis Regimen: cefazolin 2gm IV q8h End date: 07/06/2021  IV antibiotic discharge orders are pended. To discharging provider:  please sign these orders via discharge navigator,  Select New Orders & click on the button choice - Manage This Unsigned Work.     Thank you for allowing pharmacy to be a part of this patient's care.  Doreene Eland, PharmD, BCPS.   Work Cell: [phone] 06/09/2021 3:29 PM

## 2021-06-10 LAB — BASIC METABOLIC PANEL
Anion gap: 8 (ref 5–15)
BUN: 17 mg/dL (ref 6–20)
CO2: 30 mmol/L (ref 22–32)
Calcium: 9.2 mg/dL (ref 8.9–10.3)
Chloride: 98 mmol/L (ref 98–111)
Creatinine, Ser: 0.79 mg/dL (ref 0.44–1.00)
GFR, Estimated: >60 mL/min (ref >60)
Glucose, Bld: 127 mg/dL — ABNORMAL HIGH (ref 70–99)
Potassium: 4.1 mmol/L (ref 3.5–5.1)
Sodium: 136 mmol/L (ref 135–145)

## 2021-06-10 MED ORDER — METHOCARBAMOL 750 MG PO TABS: 750.0000 mg | ORAL_TABLET | Freq: Three times a day (TID) | ORAL | 0 refills | Status: DC | PRN

## 2021-06-10 MED ORDER — CEFAZOLIN IV (FOR PTA / DISCHARGE USE ONLY): 2.0000 g | Freq: Three times a day (TID) | INTRAVENOUS | 0 refills | Status: AC

## 2021-06-10 MED ORDER — ONDANSETRON HCL 4 MG PO TABS
4.0000 mg | ORAL_TABLET | Freq: Four times a day (QID) | ORAL | 0 refills | Status: DC | PRN
Start: 2021-06-10 — End: 2021-11-14

## 2021-06-10 MED ORDER — HYDROCODONE-ACETAMINOPHEN 5-325 MG PO TABS: 1.0000 | ORAL_TABLET | ORAL | 0 refills | Status: DC | PRN

## 2021-06-10 NOTE — Progress Notes (Signed)
  Subjective: 3 Days Post-Op Procedure(s) (LRB): ARTHROSCOPIC IRRIGATION AND DEBRIDEMENT (Left) Patient reports pain as mild.  Does report some mild muscle spasms. Patient is well, and has had no acute complaints or problems Denies any chills or sweats. Plan is to go Home after hospital stay. Negative for chest pain and shortness of breath Fever: no Gastrointestinal:Negative for nausea and vomiting Culture from surgery grew rare staph aureus  Objective: Vital signs in last 24 hours: Temp:  [98.1 F (36.7 C)-98.8 F (37.1 C)] 98.1 F (36.7 C) (08/19 0753) Pulse Rate:  [72-90] 90 (08/19 0753) Resp:  [16-17] 16 (08/19 0753) BP: (118-130)/(70-83) 128/74 (08/19 0753) SpO2:  [94 %-98 %] 98 % (08/19 0753)  Intake/Output from previous day: No intake or output data in the 24 hours ending 06/10/21 0811   Intake/Output this shift: No intake/output data recorded.  Labs: Recent Labs    06/07/21 1021 06/08/21 0442 06/09/21 0445  HGB 10.8* 9.1* 9.1*   Recent Labs    06/08/21 0442 06/09/21 0445  WBC 14.1* 11.2*  RBC 3.10* 3.12*  HCT 27.3* 27.7*  PLT 382 415*   Recent Labs    06/09/21 0445 06/10/21 0515  NA 137 136  K 4.0 4.1  CL 99 98  CO2 30 30  BUN 18 17  CREATININE 0.76 0.79  GLUCOSE 127* 127*  CALCIUM 8.7* 9.2   No results for input(s): LABPT, INR in the last 72 hours.   EXAM General - Patient is Alert, Appropriate, and Oriented Extremity - ABD soft Sensation intact distally Intact pulses distally Dorsiflexion/Plantar flexion intact Incision: ACE wrap applied without drainage No cellulitis present Dressing/Incision - ACE wrap and dressing underneath dry without drainage. Motor Function - intact, moving foot and toes well on exam.  Abdomen soft with normal bowel sounds. Negative homans to bilateral lower extremities.  Past Medical History:  Diagnosis Date   Bulging lumbar disc    History of mammogram 03/04/2015   BIRAD 1   History of Papanicolaou  smear of cervix 02/09/2016   NIL/NEG   Hypertension    Pap smear abnormality of cervix/human papillomavirus (HPV) positive 02/05/2015   NIL Pap with positive HPV aptima.  HPV 16,18.45 neg   Screening for colon cancer 07/2018   neg Cologuard; repeat in 3 yrs   Assessment/Plan: 3 Days Post-Op Procedure(s) (LRB): ARTHROSCOPIC IRRIGATION AND DEBRIDEMENT (Left) Active Problems:   Septic arthritis of knee, left (HCC)  Estimated body mass index is 38.96 kg/m as calculated from the following:   Height as of this encounter: '5\' 4"'$  (1.626 m).   Weight as of this encounter: 103 kg. Advance diet Up with therapy   Cultures from surgery grew rare Staph Aureus. ID consulted, currently on IV Ancef.  Will need IV Abx.  PICC line placed Hemovac was removed yeserday, no new drainage. Up with therapy today. Plan for discharge home today with IV Abx.  Home health and nursing already set up for the patient.  DVT Prophylaxis - Lovenox and Foot Pumps Weight-Bearing as tolerated to left leg  J. Cameron Proud, PA-C [facility] Orthopaedic Surgery 06/10/2021, 8:11 AM

## 2021-06-10 NOTE — TOC Progression Note (Signed)
Transition of Care [facility]) - Progression Note    Patient Details  Name: Krista Blake MRN: [id number] Date of Birth: [birth year]  Transition of Care [name]) CM/SW [place], RN Phone Number: 06/10/2021, 11:09 AM  Clinical Narrative:     Patient to discharge home today with Home health, [facility] to do nursing, they plan to be at the patient's house today at 3 to do 3 PM dose, the patient is aware       Expected Discharge Plan and Services           Expected Discharge Date: 06/10/21                                     Social Determinants of Health (SDOH) Interventions    Readmission Risk Interventions No flowsheet data found.

## 2021-06-10 NOTE — Discharge Summary (Signed)
Physician Discharge Summary  Patient ID: Krista Blake MRN: [id number] DOB/AGE: 27-Apr-1966 55 y.o.  Admit date: 06/07/2021 Discharge date: 06/10/2021  Admission Diagnoses:  Septic arthritis of knee, left [facility]) [M00.9]  Discharge Diagnoses: Patient Active Problem List   Diagnosis Date Noted   Septic arthritis of knee, left ([place]) 06/07/2021   Pre-diabetes 11/09/2020   Elevated blood pressure reading without diagnosis of hypertension 08/05/2019   Essential hypertension 11/04/2014    Past Medical History:  Diagnosis Date   Bulging lumbar disc    History of mammogram 03/04/2015   BIRAD 1   History of Papanicolaou smear of cervix 02/09/2016   NIL/NEG   Hypertension    Pap smear abnormality of cervix/human papillomavirus (HPV) positive 02/05/2015   NIL Pap with positive HPV aptima.  HPV 16,18.45 neg   Screening for colon cancer 07/2018   neg Cologuard; repeat in 3 yrs   Transfusion: None.   Consultants (if any):  Tsosie Billing, MD Infectious Disease  Discharged Condition: Improved  Hospital Course: Krista Blake is an 55 y.o. female who was admitted 06/07/2021 with a diagnosis of acute septic arthritis of the left knee with undelrying degenerative joint disease and went to the operating room on 06/07/2021 and underwent the above named procedures.    Surgeries: Procedure(s): ARTHROSCOPIC IRRIGATION AND DEBRIDEMENT on 06/07/2021 Patient tolerated the surgery well. Taken to PACU where she was stabilized and then transferred to the orthopedic floor.  Started on Lovenox 40m q 24 hrs. Foot pumps applied bilaterally at 80 mm. Heels elevated on bed with rolled towels. No evidence of DVT. Negative Homan. Physical therapy started on day #1 for gait training and transfer. OT started day #1 for ADL and assisted devices.  Repeat cultures from surgery demonstrated rare staph aureus  ID consulted for the patient, recommended four weeks of IV Ancef.  PICC line placed  and orders placed by pharmacy.  Hemovac was removed from the left knee on POD2  Implants: None.  She was given perioperative antibiotics:  Anti-infectives (From admission, onward)    Start     Dose/Rate Route Frequency Ordered Stop   06/10/21 0000  ceFAZolin (ANCEF) IVPB        2 g Intravenous Every 8 hours 06/10/21 0815 07/06/21 2359   06/08/21 1615  ceFAZolin (ANCEF) IVPB 2g/100 mL premix        2 g 200 mL/hr over 30 Minutes Intravenous Every 8 hours 06/08/21 1525     06/08/21 0600  ceFAZolin (ANCEF) IVPB 2g/100 mL premix  Status:  Discontinued        2 g 200 mL/hr over 30 Minutes Intravenous Every 8 hours 06/07/21 1844 06/08/21 1525   06/07/21 1900  vancomycin (VANCOREADY) IVPB 1500 mg/300 mL  Status:  Discontinued        1,500 mg 150 mL/hr over 120 Minutes Intravenous  Once 06/07/21 1829 06/07/21 2311   06/07/21 1830  vancomycin (VANCOCIN) IVPB 1000 mg/200 mL premix  Status:  Discontinued        1,000 mg 200 mL/hr over 60 Minutes Intravenous Every 12 hours 06/07/21 1741 06/07/21 1931   06/07/21 1153  ceFAZolin (ANCEF) 2-4 GM/100ML-% IVPB       Note to Pharmacy: RTrudie Reed  : cabinet override      06/07/21 1153 06/07/21 1340   06/07/21 1145  ceFAZolin (ANCEF) IVPB 2g/100 mL premix        2 g 200 mL/hr over 30 Minutes Intravenous  Once 06/07/21 1130 06/07/21 1409  06/07/21 1033  clindamycin (CLEOCIN) 900 MG/50ML IVPB  Status:  Discontinued       Note to Pharmacy: Trudie Reed   : cabinet override      06/07/21 1033 06/07/21 1157   06/07/21 0600  clindamycin (CLEOCIN) IVPB 900 mg  Status:  Discontinued        900 mg 100 mL/hr over 30 Minutes Intravenous On call to O.R. 06/06/21 2241 06/07/21 1130     .  She was given sequential compression devices, early ambulation, and Lovenox for DVT prophylaxis.  She benefited maximally from the hospital stay and there were no complications.    Recent vital signs:  Vitals:   06/10/21 0420 06/10/21 0753  BP: 130/71 128/74  Pulse: 75  90  Resp: 17 16  Temp: 98.1 F (36.7 C) 98.1 F (36.7 C)  SpO2: 96% 98%    Recent laboratory studies:  Lab Results  Component Value Date   HGB 9.1 (L) 06/09/2021   HGB 9.1 (L) 06/08/2021   HGB 10.8 (L) 06/07/2021   Lab Results  Component Value Date   WBC 11.2 (H) 06/09/2021   PLT 415 (H) 06/09/2021   No results found for: INR Lab Results  Component Value Date   NA 136 06/10/2021   K 4.1 06/10/2021   CL 98 06/10/2021   CO2 30 06/10/2021   BUN 17 06/10/2021   CREATININE 0.79 06/10/2021   GLUCOSE 127 (H) 06/10/2021    Discharge Medications:   Allergies as of 06/10/2021       Reactions   Penicillins Hives   Reaction : Hives during college TOLERATED CEFAZOLIN AND AMOXICILLIN        Medication List     TAKE these medications    acetaminophen 500 MG tablet Commonly known as: TYLENOL Take 1,000 mg by mouth every 8 (eight) hours as needed for moderate pain.   allopurinol 100 MG tablet Commonly known as: ZYLOPRIM Take 100 mg by mouth daily.   ceFAZolin  IVPB Commonly known as: ANCEF Inject 2 g into the vein every 8 (eight) hours for 26 days. Indication:  MSSA septic arthritis First Dose: Yes Last Day of Therapy:  07/06/2021 Labs - Once weekly (Monday):  CBC/D, CMP, ESR and CRP Remove PICC upon completion of antibiotics Fax weekly labs promptly  to [phone] Method of administration: IV Push Method of administration may be changed at the discretion of home infusion pharmacist based upon assessment of the patient and/or caregiver's ability to self-administer the medication ordered.   hydrochlorothiazide 25 MG tablet Commonly known as: HYDRODIURIL Take 25 mg by mouth daily.   HYDROcodone-acetaminophen 5-325 MG tablet Commonly known as: NORCO/VICODIN Take 1-2 tablets by mouth every 4 (four) hours as needed for moderate pain or severe pain.   indomethacin 50 MG capsule Commonly known as: INDOCIN Take 50 mg by mouth 2 (two) times daily with a meal.    Magnesium 500 MG Tabs Take 500 mg by mouth daily.   methocarbamol 750 MG tablet Commonly known as: ROBAXIN Take 1 tablet (750 mg total) by mouth every 8 (eight) hours as needed for muscle spasms.   ondansetron 4 MG tablet Commonly known as: ZOFRAN Take 1 tablet (4 mg total) by mouth every 6 (six) hours as needed for nausea.   PARoxetine 20 MG tablet Commonly known as: PAXIL Take 20 mg by mouth daily.   Potassium 99 MG Tabs Take 99 mg by mouth daily.   propranolol 40 MG tablet Commonly known as: INDERAL Take  20 mg by mouth daily.   rosuvastatin 10 MG tablet Commonly known as: CRESTOR Take 10 mg by mouth daily.               Durable Medical Equipment  (From admission, onward)           Start     Ordered   06/09/21 0837  For home use only DME Walker rolling  Once       Question Answer Comment  Walker: With 5 Inch Wheels   Patient needs a walker to treat with the following condition Weakness      06/09/21 0836              Discharge Care Instructions  (From admission, onward)           Start     Ordered   06/10/21 0000  Change dressing on IV access line weekly and PRN  (Home infusion instructions - [facility] )        06/10/21 0815            Diagnostic Studies: [place] EKG SITE RITE  Result Date: 06/08/2021 If Site Rite image not attached, placement could not be confirmed due to current cardiac rhythm.   Disposition: Discharge home with HHPT following morning PT session.  Discharge Instructions     [facility] pharmacist to adjust dose for Vancomycin, Aminoglycosides and other anti-infective therapies as requested by physician.   Complete by: As directed    [facility] to provide Cath Flo 41m   Complete by: As directed    Administer for PICC line occlusion and as ordered by physician for other access device issues.   Anaphylaxis Kit: Provided to treat any anaphylactic reaction to the medication being  provided to the patient if First Dose or when requested by physician   Complete by: As directed    Epinephrine 1102mml vial / amp: Administer 0.78m678m0.78ml81mubcutaneously once for moderate to severe anaphylaxis, nurse to call physician and pharmacy when reaction occurs and call 911 if needed for immediate care   Diphenhydramine 50mg19mIV vial: Administer 25-50mg 84mM PRN for first dose reaction, rash, itching, mild reaction, nurse to call physician and pharmacy when reaction occurs   Sodium Chloride 0.9% NS 500ml I96mdminister if needed for hypovolemic blood pressure drop or as ordered by physician after call to physician with anaphylactic reaction   Change dressing on IV access line weekly and PRN   Complete by: As directed    Flush IV access with Sodium Chloride 0.9% and Heparin 10 units/ml or 100 units/ml   Complete by: As directed    Home infusion instructions - [facility]   Complete by: As directed    Instructions: Flush IV access with Sodium Chloride 0.9% and Heparin 10units/ml or 100units/ml   Change dressing on IV access line: Weekly and PRN   Instructions Cath Flo 2mg: Ad38mister for PICC Line occlusion and as ordered by physician for other access device   [facility] pharmacist to adjust dose for: Vancomycin, Aminoglycosides and other anti-infective therapies as requested by physician   Method of administration may be changed at the discretion of home infusion pharmacist based upon assessment of the patient and/or caregiver's ability to self-administer the medication ordered   Complete by: As directed         Follow-up Information     [facility]illing[facility]   Specialty: Infectious Diseases Why: Call for follow-up appointment. Contact information:  [place] [address] [phone]         Poggi, Marshall Cork, MD Follow up on 06/17/2021.   Specialty: Orthopedic Surgery Contact information: [place] [address] [phone]                Signed: Judson Roch PA-C 06/10/2021, 8:16 AM

## 2021-06-10 NOTE — Discharge Instructions (Signed)

## 2021-06-12 LAB — AEROBIC/ANAEROBIC CULTURE W GRAM STAIN (SURGICAL/DEEP WOUND): Gram Stain: NONE SEEN

## 2021-06-13 NOTE — TOC Progression Note (Addendum)
Transition of Care [facility]) - Progression Note    Patient Details  Name: Krista Blake MRN: [id number] Date of Birth: 1966/05/01  [facility]) CM/SW Contact  Su Hilt, RN Phone Number: 06/13/2021, 2:54 PM  Clinical Narrative:     Dr Nicholaus Bloom office called wanting to see when [name] services was going to go out to see the patient, I contacted Pam with [facility] to follow up on [place] for Nursing and Tommi Rumps at [facility] to check on [name] for PT, awaiting calls back, to call Poggi's office back at [phone]   Update, [name] went out  to see the patient the day she went home for Nursing, and [facility] called today to confirm all was well, [name] is scheduled to see her tomorrow for PT and will call her today to arrange, I notified Dr Nicholaus Bloom office    Expected Discharge Plan and Services           Expected Discharge Date: 06/10/21                                     Social Determinants of Health (SDOH) Interventions    Readmission Risk Interventions No flowsheet data found.

## 2021-06-28 ENCOUNTER — Other Ambulatory Visit: Payer: Self-pay

## 2021-06-28 ENCOUNTER — Ambulatory Visit: Payer: BC Managed Care – PPO | Attending: Infectious Diseases | Admitting: Infectious Diseases

## 2021-06-28 VITALS — BP 116/82 | HR 77 | Resp 16 | Ht 64.0 in | Wt 227.0 lb

## 2021-06-28 DIAGNOSIS — M00062 Staphylococcal arthritis, left knee: Secondary | ICD-10-CM | POA: Diagnosis not present

## 2021-06-28 DIAGNOSIS — I10 Essential (primary) hypertension: Secondary | ICD-10-CM | POA: Diagnosis not present

## 2021-06-28 DIAGNOSIS — Z880 Allergy status to penicillin: Secondary | ICD-10-CM | POA: Diagnosis not present

## 2021-06-28 DIAGNOSIS — M00061 Staphylococcal arthritis, right knee: Secondary | ICD-10-CM | POA: Diagnosis present

## 2021-06-28 DIAGNOSIS — Z881 Allergy status to other antibiotic agents status: Secondary | ICD-10-CM | POA: Diagnosis not present

## 2021-06-28 DIAGNOSIS — Z79899 Other long term (current) drug therapy: Secondary | ICD-10-CM | POA: Insufficient documentation

## 2021-06-28 DIAGNOSIS — B9561 Methicillin susceptible Staphylococcus aureus infection as the cause of diseases classified elsewhere: Secondary | ICD-10-CM | POA: Diagnosis not present

## 2021-06-28 NOTE — Patient Instructions (Signed)
You are here fo follow up of spetic left knee- you will complete IV antibiotics on 07/06/21 and will take Keflex '500mg'$  Po Q6 after that for 2 weeks- wills e eyou in 3 weeks

## 2021-06-28 NOTE — Progress Notes (Signed)
NAME: Krista Blake  DOB: 23-Oct-1966  MRN: [id number]  Date/Time: 06/28/2021 11:38 AM  R Subjective:   ? Krista Blake is a 55 y.o. female here for follow up after recent hospitalization for left septic knee due to MSSA-she is currently on a 4 week course of cefazolin. She says her left knee pain is better- undergoing PT-= taking cefazolin regularly three times a day thru PICC She will complete 4 weeks on 07/06/21  The following is taken from my previous note She initially presented to Dr. Nicholaus Bloom office on 04/18/2021 with new onset left knee pain.  It had started 10 days prior to her presentation.  She has a history of left medial meniscus tear.  She did not elect for arthroscopy and has been treated conservatively with steroid injections and oral medications in he past.  She had noted that in the last 10 days of swelling in the knee had increased and pain was also worse.  She went to [facility] urgent care and received left knee steroid injection . The pain elief was short lived. On 04/18/2021 she saw Dr.Poggi and he did an aspiration of the knee and gave her Medrol pack.  The fluid was sent for cell count, crystals and culture. There were 55,943 cells with 96% neutrophils.  Extracellular monosodium urate crystals were seen.  She was treated like gout and started on NSAID and colchicine.  SThe culture was staph aureus.  This was not treated because they thought it was contamination as the patient was improving.  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  Culture again was staph aureus.  Patient was started on Bactrim post surgery.  On follow-up visit she had not significantly improved.  She was still complaining of pain.  So she was taken back to  the OR tand underwent arthroscopic irrigation, debridement with extensive debridement and abrasion chondroplasty of the medial femoral condyle, lateral femoral condyle and patella of the left knee.culture positive for MSSA and she was discharged homeon IV cefazolin  Past Medical History:  Diagnosis Date   Bulging lumbar disc    History of mammogram 03/04/2015   BIRAD 1   History of Papanicolaou smear of cervix 02/09/2016   NIL/NEG   Hypertension    Pap smear abnormality of cervix/human papillomavirus (HPV) positive 02/05/2015   NIL Pap with positive HPV aptima.  HPV 16,18.45 neg   Screening for colon cancer 07/2018   neg Cologuard; repeat in 3 yrs    Past Surgical History:  Procedure Laterality Date   I & D EXTREMITY Left 05/04/2021   Procedure: KNEE ARTHROSCOPY WITH IRRIGATION AND DEBRIDEMENT;  Surgeon: Corky Mull, MD;  Location: [facility] ORS;  Service: Orthopedics;  Laterality: Left;   KNEE ARTHROSCOPY Left 06/07/2021   Procedure: ARTHROSCOPIC IRRIGATION AND DEBRIDEMENT;  Surgeon: Corky Mull, MD;  Location: [facility] ORS;  Service: Orthopedics;  Laterality: Left;    Social History   Socioeconomic History   Marital status: Single    Spouse name: Not on file   Number of children: Not on file   Years of education: Not on file   Highest education level: Not on file  Occupational History   Not on file  Tobacco Use   Smoking status: Never   Smokeless tobacco: Never  Vaping Use   Vaping  Use: Never used  Substance and Sexual Activity   Alcohol use: Yes    Alcohol/week: 0.0 standard drinks    Comment: social   Drug use: No   Sexual activity: Not Currently    Birth control/protection: Pill  Other Topics Concern   Not on file  Social History Narrative   Not on file   Social Determinants of Health   Financial Resource Strain: Not on file  Food Insecurity: Not on file  Transportation Needs: Not on file  Physical Activity: Not on file  Stress: Not on file  Social Connections:  Not on file  Intimate Partner Violence: Not on file    Family History  Problem Relation Age of Onset   Hypertension Father    Diabetes Paternal Uncle    Diabetes Paternal Grandfather    Ovarian cancer Neg Hx    Breast cancer Neg Hx    Colon cancer Neg Hx    Allergies  Allergen Reactions   Penicillins Hives    Reaction : Hives during college TOLERATED CEFAZOLIN AND AMOXICILLIN   I? Current Outpatient Medications  Medication Sig Dispense Refill   allopurinol (ZYLOPRIM) 100 MG tablet Take 100 mg by mouth daily.     ceFAZolin (ANCEF) IVPB Inject 2 g into the vein every 8 (eight) hours for 26 days. Indication:  MSSA septic arthritis First Dose: Yes Last Day of Therapy:  07/06/2021 Labs - Once weekly (Monday):  CBC/D, CMP, ESR and CRP Remove PICC upon completion of antibiotics Fax weekly labs promptly  to [phone] Method of administration: IV Push Method of administration may be changed at the discretion of home infusion pharmacist based upon assessment of the patient and/or caregiver's ability to self-administer the medication ordered. 78 Units 0   hydrochlorothiazide (HYDRODIURIL) 25 MG tablet Take 25 mg by mouth daily.     HYDROcodone-acetaminophen (NORCO/VICODIN) 5-325 MG tablet Take 1-2 tablets by mouth every 4 (four) hours as needed for moderate pain or severe pain. 60 tablet 0   Magnesium 500 MG TABS Take 500 mg by mouth daily.     methocarbamol (ROBAXIN) 750 MG tablet Take 1 tablet (750 mg total) by mouth every 8 (eight) hours as needed for muscle spasms. 30 tablet 0   ondansetron (ZOFRAN) 4 MG tablet Take 1 tablet (4 mg total) by mouth every 6 (six) hours as needed for nausea. 30 tablet 0   PARoxetine (PAXIL) 20 MG tablet Take 20 mg by mouth daily.     propranolol (INDERAL) 40 MG tablet Take 20 mg by mouth daily.     rosuvastatin (CRESTOR) 10 MG tablet Take 10 mg by mouth daily.     acetaminophen (TYLENOL) 500 MG tablet Take 1,000 mg by mouth every 8 (eight) hours as  needed for moderate pain.     indomethacin (INDOCIN) 50 MG capsule Take 50 mg by mouth 2 (two) times daily with a meal.     Potassium 99 MG TABS Take 99 mg by mouth daily.     No current facility-administered medications for this visit.     Abtx:  Anti-infectives (From admission, onward)    None       REVIEW OF SYSTEMS:  Const: negative fever, negative chills, negative weight loss Eyes: negative diplopia or visual changes, negative eye pain ENT: negative coryza, negative sore throat Resp: negative cough, hemoptysis, dyspnea Cards: negative for chest pain, palpitations, lower extremity edema GU: negative for frequency, dysuria and hematuria GI: Negative for abdominal pain, diarrhea, bleeding, constipation Skin: itching  and neurodermatitis Heme: negative for easy bruising and gum/nose bleeding MS: left knee pain Neurolo:negative for headaches, dizziness, vertigo, memory problems  Psych: negative for feelings of anxiety, depression  Endocrine: negative for thyroid, diabetes Allergy/Immunology- negative for any medication or food allergies ?  Objective:  VITALS:  BP 116/82   Pulse 77   Resp 16   Ht 5' 4"  (1.626 m)   Wt 227 lb (103 kg)   LMP  (LMP Unknown)   SpO2 95%   BMI 38.96 kg/m  PHYSICAL EXAM:  General: Alert, cooperative, no distress, appears stated age. In wheel chair Head: Normocephalic, without obvious abnormality, atraumatic. Eyes: Conjunctivae clear, anicteric sclerae. Pupils are equal ENT Nares normal. No drainage or sinus tenderness. Lips, mucosa, and tongue normal. No Thrush Neck: Supple, symmetrical, no adenopathy, thyroid: non tender no carotid bruit and no JVD. Back: No CVA tenderness. Lungs: Clear to auscultation bilaterally. No Wheezing or Rhonchi. No rales. Heart: Regular rate and rhythm, no murmur, rub or gallop. Abdomen: Soft, non-tender,not distended. Bowel sounds normal. No masses Extremities: left knee minimal swelling  Skin:  hyperpigmented lesions'  Lymph: Cervical, supraclavicular normal. Neurologic: Grossly non-focal Pertinent Labs 8/30 ESR 76, CRP 1.9 Cr N   ? Impression/Recommendation ?MSSA rt knee septic arthritis- s/p I/D is on 4 weeks on IV cefazolin until  9/14 and will prescribe keflex 593m PO Q6 for 2-3 weeks after that with start date of 07/07/21 She will follow up with ortho this friday Follow up in 2-3 weeks ? ? _____________ Note:  This document was prepared using [employer] analystand may include unintentional dictation errors.

## 2021-06-29 MED ORDER — CEPHALEXIN 500 MG PO CAPS: 500.0000 mg | ORAL_CAPSULE | Freq: Four times a day (QID) | ORAL | 1 refills | Status: DC

## 2021-07-12 ENCOUNTER — Telehealth: Payer: Self-pay

## 2021-07-12 NOTE — Telephone Encounter (Signed)
Per Dr Delaine Lame : Please contact patient and ask if she had PICC pulled and did she begin oral Keflex?  Attempted to call and there was no answer and no vm.

## 2021-07-19 ENCOUNTER — Ambulatory Visit: Payer: BC Managed Care – PPO | Attending: Infectious Diseases | Admitting: Infectious Diseases

## 2021-07-19 ENCOUNTER — Other Ambulatory Visit: Payer: Self-pay

## 2021-07-19 ENCOUNTER — Other Ambulatory Visit
Admission: RE | Admit: 2021-07-19 | Discharge: 2021-07-19 | Disposition: A | Discharge status: Home / Self Care | Payer: BC Managed Care – PPO | Source: Ambulatory Visit | Attending: Infectious Diseases | Admitting: Infectious Diseases

## 2021-07-19 VITALS — BP 126/86 | HR 78 | Resp 16 | Ht 64.0 in | Wt 227.0 lb

## 2021-07-19 DIAGNOSIS — Z79899 Other long term (current) drug therapy: Secondary | ICD-10-CM | POA: Diagnosis not present

## 2021-07-19 DIAGNOSIS — Z881 Allergy status to other antibiotic agents status: Secondary | ICD-10-CM | POA: Insufficient documentation

## 2021-07-19 DIAGNOSIS — Z880 Allergy status to penicillin: Secondary | ICD-10-CM | POA: Diagnosis not present

## 2021-07-19 DIAGNOSIS — M00062 Staphylococcal arthritis, left knee: Secondary | ICD-10-CM | POA: Insufficient documentation

## 2021-07-19 LAB — SEDIMENTATION RATE: Sed Rate: 29 mm/hr (ref 0–30)

## 2021-07-19 LAB — COMPREHENSIVE METABOLIC PANEL
ALT: 13 U/L (ref 0–44)
AST: 21 U/L (ref 15–41)
Albumin: 4.2 g/dL (ref 3.5–5.0)
Alkaline Phosphatase: 84 U/L (ref 38–126)
Anion gap: 11 (ref 5–15)
BUN: 14 mg/dL (ref 6–20)
CO2: 27 mmol/L (ref 22–32)
Calcium: 9.6 mg/dL (ref 8.9–10.3)
Chloride: 101 mmol/L (ref 98–111)
Creatinine, Ser: 0.66 mg/dL (ref 0.44–1.00)
GFR, Estimated: >60 mL/min (ref >60)
Glucose, Bld: 117 mg/dL — ABNORMAL HIGH (ref 70–99)
Potassium: 3.6 mmol/L (ref 3.5–5.1)
Sodium: 139 mmol/L (ref 135–145)
Total Bilirubin: 0.8 mg/dL (ref 0.3–1.2)
Total Protein: 7.7 g/dL (ref 6.5–8.1)

## 2021-07-19 LAB — CBC WITH DIFFERENTIAL/PLATELET
Abs Immature Granulocytes: 0.02 10*3/uL (ref 0.00–0.07)
Basophils Absolute: 0 10*3/uL (ref 0.0–0.1)
Basophils Relative: 1 %
Eosinophils Absolute: 0.1 10*3/uL (ref 0.0–0.5)
Eosinophils Relative: 2 %
HCT: 39.5 % (ref 36.0–46.0)
Hemoglobin: 13.1 g/dL (ref 12.0–15.0)
Immature Granulocytes: 1 %
Lymphocytes Relative: 38 %
Lymphs Abs: 1.4 10*3/uL (ref 0.7–4.0)
MCH: 28.9 pg (ref 26.0–34.0)
MCHC: 33.2 g/dL (ref 30.0–36.0)
MCV: 87 fL (ref 80.0–100.0)
Monocytes Absolute: 0.3 10*3/uL (ref 0.1–1.0)
Monocytes Relative: 9 %
Neutro Abs: 1.9 10*3/uL (ref 1.7–7.7)
Neutrophils Relative %: 49 %
Platelets: 225 10*3/uL (ref 150–400)
RBC: 4.54 MIL/uL (ref 3.87–5.11)
RDW: 13.5 % (ref 11.5–15.5)
WBC: 3.8 10*3/uL — ABNORMAL LOW (ref 4.0–10.5)
nRBC: 0 % (ref 0.0–0.2)

## 2021-07-19 LAB — C-REACTIVE PROTEIN: CRP: 0.6 mg/dL (ref <1.0)

## 2021-07-19 NOTE — Progress Notes (Signed)
NAME: Krista Blake  DOB: Jan 10, 1966  MRN: [id number]  Date/Time: 07/19/2021 9:15 AM  R Subjective:   ?follow up visit Krista Blake is a 55 y.o. female here for follow up  for left septic knee due to [facility] last saw her on 06/28/21- she completed 4 weeks of Iv cefazolin on 07/06/21- PICC was removed and she started keflex 569m PO 6 on 07/07/21- She is currently taking it- Says she can taste the medicine- no diarrhea, no nausea or vomiting Says the kne eis better than before but still stiff- walks with crutches. Goes to PT twice a week Swelling of the left knee is better Last Saw Dr.Poggi 9/922-  has an appt in 2 weeks  The following is taken from my previous note She initially presented to Dr. [facility] on 04/18/2021 with new onset left knee pain.  It had started 10 days prior to her presentation.  She has a history of left medial meniscus tear.  She did not elect for arthroscopy and has been treated conservatively with steroid injections and oral medications in he past.  She had noted that in the last 10 days of swelling in the knee had increased and pain was also worse.  [facility] . The pain elief was short lived. On 04/18/2021 she saw Dr.Poggi and he did an aspiration of the knee and gave her Medrol pack.  The fluid was sent for cell count, crystals and culture. There were 55,943 cells with 96% neutrophils.  Extracellular monosodium urate crystals were seen.  She was treated like gout and started on NSAID and colchicine.  SThe culture was staph aureus.  This was not treated because they thought it was contamination as the patient was improving.  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  Culture again was staph aureus.  Patient was started on Bactrim post surgery.  On follow-up visit she had not significantly improved.  She was still complaining of pain.  So she was taken back to the OR tand underwent arthroscopic irrigation, debridement with extensive debridement and abrasion chondroplasty of the medial femoral condyle, lateral femoral condyle and patella of the left knee.culture positive for MSSA and she was discharged homeon IV cefazolin  Past Medical History:  Diagnosis Date   Bulging lumbar disc    History of mammogram 03/04/2015   BIRAD 1   History of Papanicolaou smear of cervix 02/09/2016   NIL/NEG   Hypertension    Pap smear abnormality of cervix/human papillomavirus (HPV) positive 02/05/2015   NIL Pap with positive HPV aptima.  HPV 16,18.45 neg   Screening for colon cancer 07/2018   neg Cologuard; repeat in 3 yrs    Past Surgical History:  Procedure Laterality Date   I & D EXTREMITY Left 05/04/2021   Procedure: KNEE ARTHROSCOPY WITH IRRIGATION AND DEBRIDEMENT;  Surgeon: PCorky Mull MD;  Location: [facility] ORS;  Service: Orthopedics;  Laterality: Left;   KNEE ARTHROSCOPY Left 06/07/2021   Procedure: ARTHROSCOPIC IRRIGATION AND DEBRIDEMENT;  Surgeon: PCorky Mull MD;  Location: [facility] ORS;  Service: Orthopedics;  Laterality: Left;    Social History   Socioeconomic History   Marital status: Single    Spouse name: Not on file   Number of children: Not  on file   Years of education: Not on file   Highest education level: Not on file  Occupational History   Not on file  Tobacco Use   Smoking status: Never   Smokeless tobacco: Never  Vaping Use   Vaping Use: Never used  Substance and Sexual Activity   Alcohol use: Yes    Alcohol/week: 0.0 standard drinks    Comment: social   Drug use: No   Sexual activity: Not Currently    Birth control/protection: Pill  Other Topics Concern   Not on file  Social History Narrative   Not on file   Social  Determinants of Health   Financial Resource Strain: Not on file  Food Insecurity: Not on file  Transportation Needs: Not on file  Physical Activity: Not on file  Stress: Not on file  Social Connections: Not on file  Intimate Partner Violence: Not on file    Family History  Problem Relation Age of Onset   Hypertension Father    Diabetes Paternal Uncle    Diabetes Paternal Grandfather    Ovarian cancer Neg Hx    Breast cancer Neg Hx    Colon cancer Neg Hx    Allergies  Allergen Reactions   Penicillins Hives    Reaction : Hives during college TOLERATED CEFAZOLIN AND AMOXICILLIN   I? Current Outpatient Medications  Medication Sig Dispense Refill   allopurinol (ZYLOPRIM) 100 MG tablet Take 100 mg by mouth daily.     cephALEXin (KEFLEX) 500 MG capsule Take 1 capsule (500 mg total) by mouth 4 (four) times daily. 56 capsule 1   hydrochlorothiazide (HYDRODIURIL) 25 MG tablet Take 25 mg by mouth daily.     HYDROcodone-acetaminophen (NORCO/VICODIN) 5-325 MG tablet Take 1-2 tablets by mouth every 4 (four) hours as needed for moderate pain or severe pain. 60 tablet 0   Magnesium 500 MG TABS Take 500 mg by mouth daily.     methocarbamol (ROBAXIN) 750 MG tablet Take 1 tablet (750 mg total) by mouth every 8 (eight) hours as needed for muscle spasms. 30 tablet 0   ondansetron (ZOFRAN) 4 MG tablet Take 1 tablet (4 mg total) by mouth every 6 (six) hours as needed for nausea. 30 tablet 0   PARoxetine (PAXIL) 20 MG tablet Take 20 mg by mouth daily.     propranolol (INDERAL) 40 MG tablet Take 20 mg by mouth daily.     rosuvastatin (CRESTOR) 10 MG tablet Take 10 mg by mouth daily.     No current facility-administered medications for this visit.     Abtx:  Anti-infectives (From admission, onward)    None       REVIEW OF SYSTEMS:  Const: negative fever, negative chills, negative weight loss Eyes: negative diplopia or visual changes, negative eye pain ENT: negative coryza, negative sore  throat Resp: negative cough, hemoptysis, dyspnea Cards: negative for chest pain, palpitations, lower extremity edema GU: negative for frequency, dysuria and hematuria GI: Negative for abdominal pain, diarrhea, bleeding, constipation Skin: itching and neurodermatitis Heme: negative for easy bruising and gum/nose bleeding MS: left knee pain Neurolo:negative for headaches, dizziness, vertigo, memory problems  Psych: negative for feelings of anxiety, depression  Endocrine: negative for thyroid, diabetes Allergy/Immunology- negative for any medication or food allergies ?  Objective:  VITALS:  BP 126/86   Pulse 78   Resp 16   Ht 5' 4"  (1.626 m)   Wt 227 lb (103 kg)   LMP  (LMP Unknown)   SpO2 96%  BMI 38.96 kg/m  PHYSICAL EXAM:  General: Alert, cooperative, no distress, appears stated age. In wheel chair, walked with crutches in the corridor Head: Normocephalic, without obvious abnormality, atraumatic. Eyes: Conjunctivae clear, anicteric sclerae. Pupils are equal ENT Nares normal. No drainage or sinus tenderness. Lips, mucosa, and tongue normal. No Thrush Neck: Supple, symmetrical, no adenopathy, thyroid: non tender no carotid bruit and no JVD. Back: No CVA tenderness. Lungs: Clear to auscultation bilaterally. No Wheezing or Rhonchi. No rales. Heart: Regular rate and rhythm, no murmur, rub or gallop. Abdomen: Soft, non-tender,not distended. Bowel sounds normal. No masses Extremities: left knee minimal swelling Extension and flexion of the knee- is still limited  Skin: hyperpigmented lesions'  Lymph: Cervical, supraclavicular normal. Neurologic: Grossly non-focal Pertinent Labs 8/30 ESR 76, CRP 1.9 Cr N   ? Impression/Recommendation ?MSSA rt knee septic arthritis- completed 4 weeks of IV cefazolin on 07/06/21 and started keflex on 07/07/21 for 2 weeks- will get ESR/CRP/CBC/CP today- may extend for another 2 weeks Follow up after this will be PRN ? ? _____________ Note:   This document was prepared using Dragon voice recognition software and may include unintentional dictation errors.

## 2021-07-19 NOTE — Patient Instructions (Signed)
You are here for follow up of left knee septic arthritis- you are now completing 6 weeks of antibiotic- today will do some labs and depending on that may extend oral keflex for 2 more weeks

## 2021-07-20 ENCOUNTER — Telehealth: Payer: Self-pay | Admitting: Infectious Diseases

## 2021-07-20 NOTE — Telephone Encounter (Signed)
Informed patient the results of  labs from yesterday -  ESR 29 CRP 0.5 Normal WBC 3.8 ( borderline low) CMP-OK She will complete keflex tomorrow nd has finished 6 weeks of appropriate antibiotic for MSSA left knee septic arthritis

## 2021-08-01 ENCOUNTER — Ambulatory Visit: Payer: BC Managed Care – PPO | Admitting: Dermatology

## 2021-08-01 ENCOUNTER — Other Ambulatory Visit
Admission: RE | Admit: 2021-08-01 | Discharge: 2021-08-01 | Disposition: A | Discharge status: Home / Self Care | Payer: BC Managed Care – PPO | Source: Ambulatory Visit | Attending: Sports Medicine | Admitting: Sports Medicine

## 2021-08-01 DIAGNOSIS — M00062 Staphylococcal arthritis, left knee: Secondary | ICD-10-CM | POA: Insufficient documentation

## 2021-08-01 LAB — SYNOVIAL CELL COUNT + DIFF, W/ CRYSTALS
Crystals, Fluid: NONE SEEN
Eosinophils-Synovial: 0 %
Lymphocytes-Synovial Fld: 7 %
Monocyte-Macrophage-Synovial Fluid: 6 %
Neutrophil, Synovial: 87 %
Other Cells-SYN: 0
WBC, Synovial: 213819 /mm3 — ABNORMAL HIGH (ref 0–200)

## 2021-08-02 ENCOUNTER — Other Ambulatory Visit: Payer: Self-pay | Admitting: Surgery

## 2021-08-02 ENCOUNTER — Other Ambulatory Visit: Payer: Self-pay

## 2021-08-02 ENCOUNTER — Ambulatory Visit
Admission: RE | Admit: 2021-08-02 | Discharge: 2021-08-02 | Disposition: A | Discharge status: Home / Self Care | Payer: BC Managed Care – PPO | Source: Ambulatory Visit | Attending: Surgery | Admitting: Surgery

## 2021-08-02 ENCOUNTER — Ambulatory Visit: Payer: BC Managed Care – PPO | Admitting: Dermatology

## 2021-08-02 DIAGNOSIS — M25462 Effusion, left knee: Secondary | ICD-10-CM | POA: Insufficient documentation

## 2021-08-02 DIAGNOSIS — M869 Osteomyelitis, unspecified: Secondary | ICD-10-CM | POA: Insufficient documentation

## 2021-08-02 DIAGNOSIS — M00062 Staphylococcal arthritis, left knee: Secondary | ICD-10-CM | POA: Insufficient documentation

## 2021-08-02 IMAGING — MR MR KNEE*L* W/O CM
6 series · 40 of 40 positions shown · non-contrast
Comparison: None.

CLINICAL DATA: Staphylococcal arthritis of left knee. Left knee
irrigation and debridement [DATE] and [DATE].

EXAM:
MRI OF THE LEFT KNEE WITHOUT CONTRAST
TECHNIQUE: Multiplanar, multisequence MR imaging of the knee was performed. No
intravenous contrast was administered.

[Series 16: T2 fat-sat · axial · left · 4.0mm · 0.50mm/px · z∈[-101,+24]mm · 4 of 26 slices shown (1 of 3)]
[im 1/26]
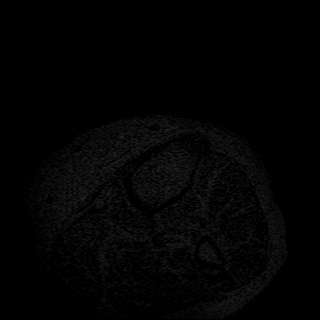
[im 9/26]
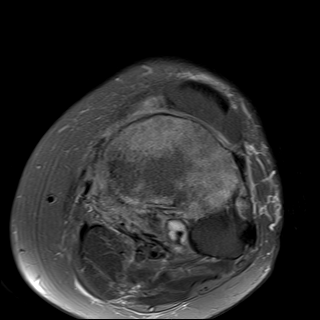
[im 17/26]
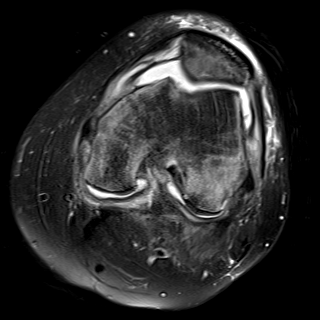
[im 26/26]
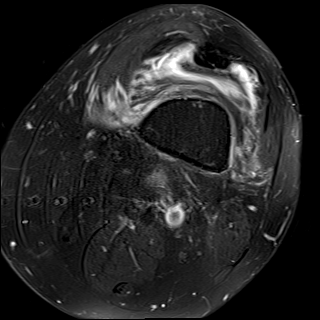

[Series 17: T1 · coronal · left · 4.0mm · 0.47mm/px · 7 of 32 slices shown]
[im 1/32]
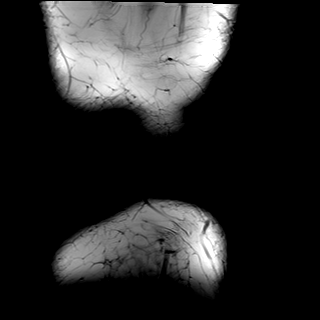
[im 6/32]
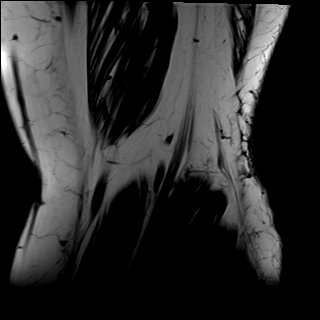
[im 11/32]
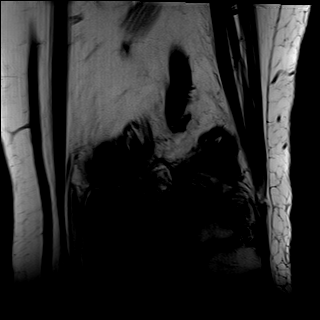
[im 16/32]
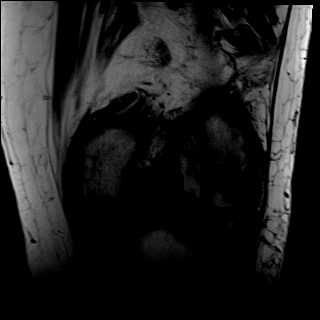
[im 21/32]
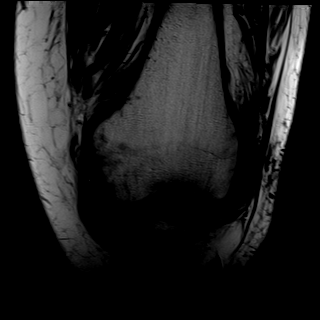
[im 26/32]
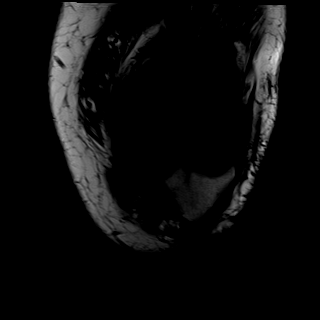
[im 32/32]
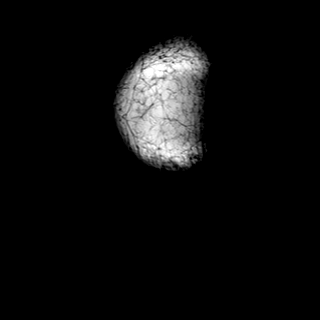

[Series 18: T2 fat-sat · coronal · left · 4.0mm · 0.47mm/px · 7 of 32 slices shown (2 of 3)]
[im 1/32]
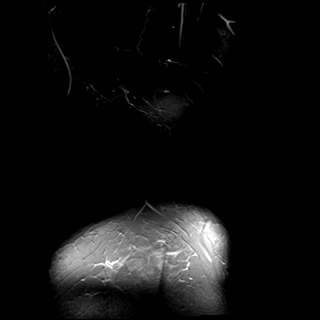
[im 6/32]
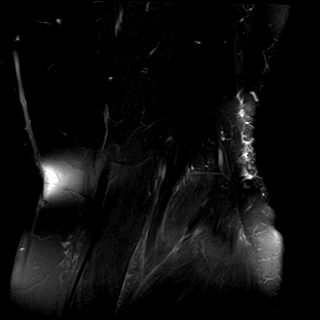
[im 11/32]
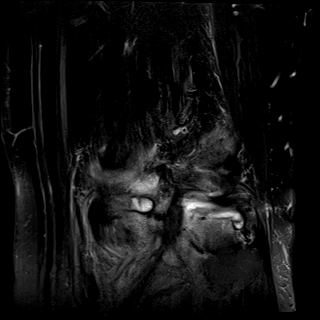
[im 16/32]
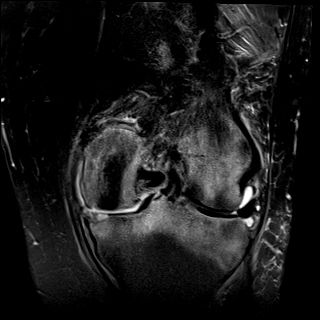
[im 21/32]
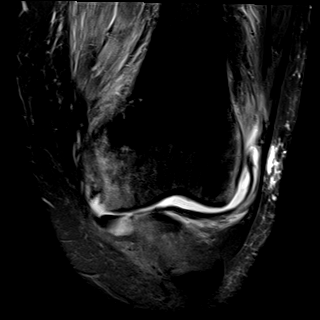
[im 26/32]
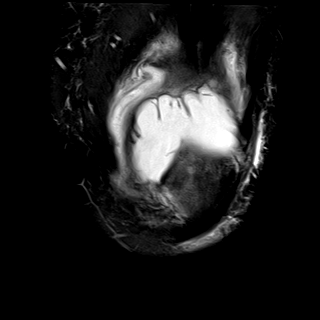
[im 32/32]
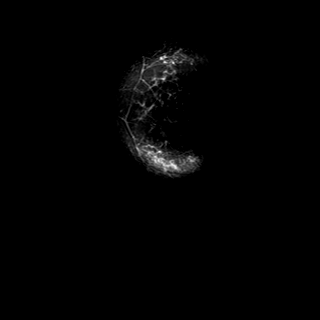

[Series 19: PD fat-sat · coronal · left · 4.0mm · 0.59mm/px · 7 of 32 slices shown (1 of 2)]
[im 1/32]
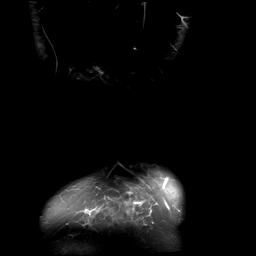
[im 6/32]
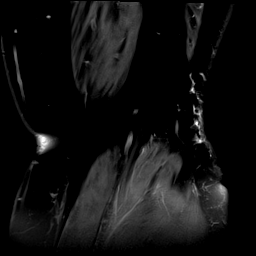
[im 11/32]
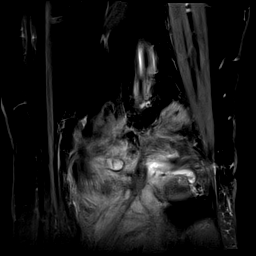
[im 16/32]
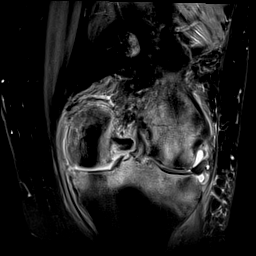
[im 21/32]
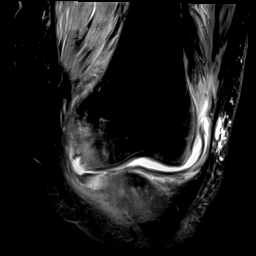
[im 26/32]
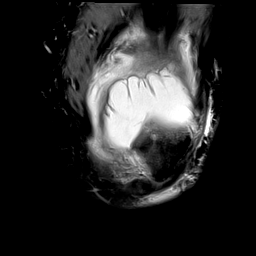
[im 32/32]
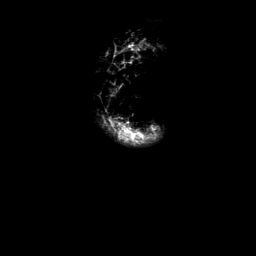

[Series 20: PD fat-sat · sagittal · left · 3.0mm · 0.53mm/px · 7 of 34 slices shown (2 of 2)]
[im 1/34]
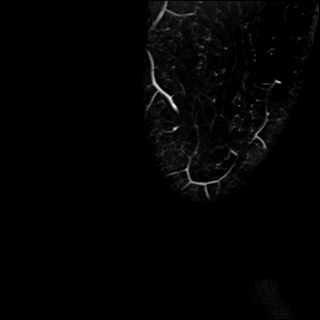
[im 6/34]
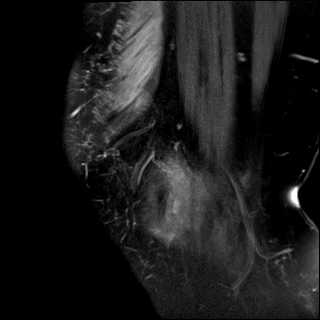
[im 12/34]
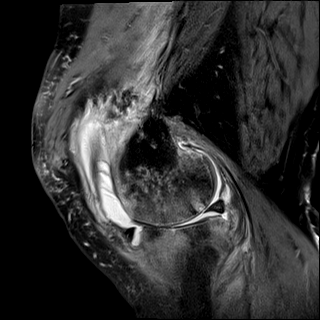
[im 17/34]
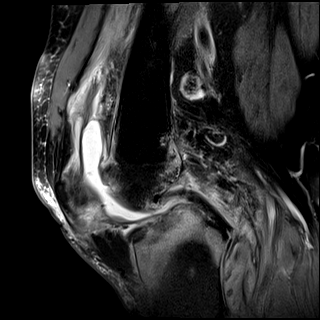
[im 23/34]
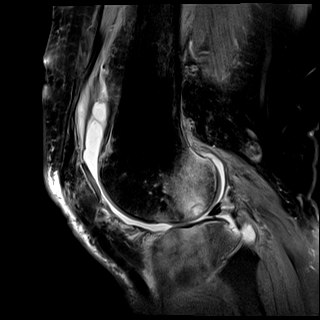
[im 28/34]
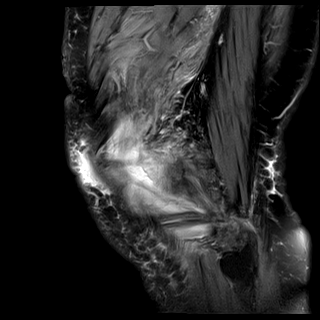
[im 34/34]
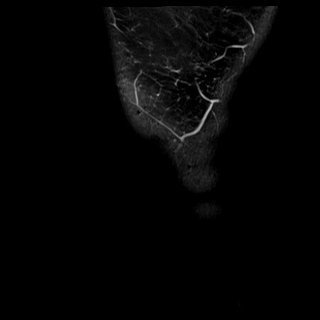

[Series 21: T2 fat-sat · sagittal · left · 3.0mm · 0.53mm/px · 8 of 36 slices shown (3 of 3)]
[im 1/36]
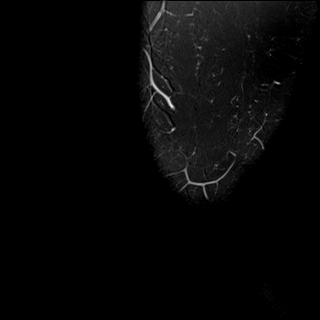
[im 6/36]
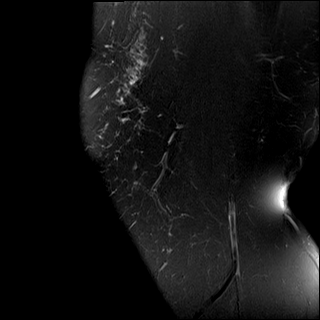
[im 11/36]
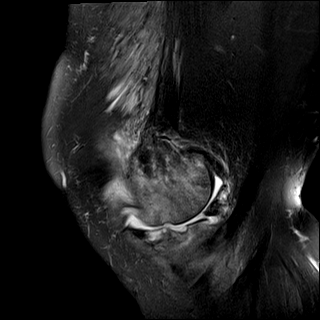
[im 16/36]
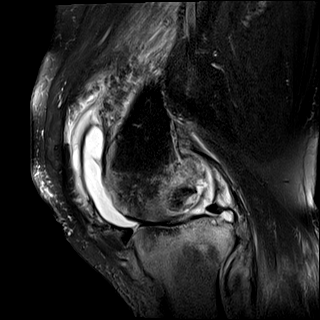
[im 21/36]
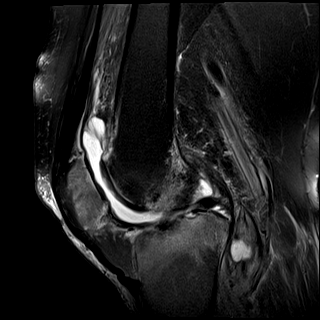
[im 26/36]
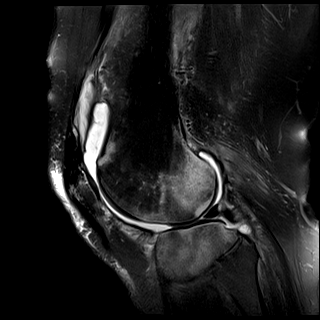
[im 31/36]
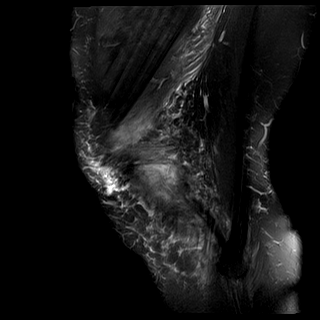
[im 36/36]
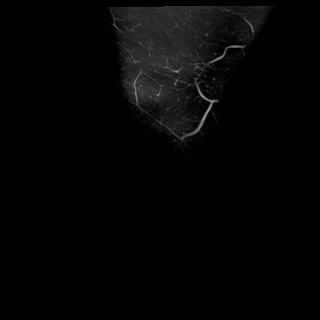

[40 of 40 positions shown; findings below may reference images not displayed]

FINDINGS: MENISCI

Medial meniscus: The medial meniscus is largely extruded
peripherally from the joint, and the meniscal body is diffusely
diminutive. These findings may be secondary to prior subtotal
meniscectomy. No centrally displaced meniscal fragment identified.

Lateral meniscus: Diminutive anterior horn which may be secondary to
previous partial meniscectomy. The meniscal body is partially
extruded peripherally from the joint. No centrally displaced
meniscal fragment.

LIGAMENTS

Cruciates:  Intact.

Collaterals:  Intact.

CARTILAGE

Patellofemoral: Mild-to-moderate patellofemoral chondral thinning
and surface irregularity, most notably in the medial trochlea. No
significant subchondral edema or cortical destruction at the
patellofemoral joint.

Medial: Advanced chondral thinning, surface irregularity and diffuse
subchondral edema. There are peripheral erosive changes within the
medial femoral condyle and medial tibial plateau, suspicious for
osteomyelitis.

Lateral: Mild chondral thinning and surface irregularity with
diffuse subchondral edema. There is a large erosion involving the
lateral femoral condyle posteriorly.

MISCELLANEOUS

Joint:  Moderate size complex joint effusion.

Popliteal Fossa: Complex fluid in the popliteus hiatus. The
popliteus tendon is intact. No significant Baker's cyst.

Extensor Mechanism:  Intact.

Bones: As above, extensive subchondral edema with multiple erosions
of the distal femur and proximal tibia, suspicious for septic
arthritis and osteomyelitis. No evidence of acute fracture.

Other: Mild generalized periarticular soft tissue edema without
other focal fluid collection.
IMPRESSION: 1. No relevant comparison studies available.
2. Moderate size complex knee joint effusion with diffuse
subchondral edema and multiple erosions involving the distal femur
and proximal tibia, consistent with known septic arthritis and
osteomyelitis.
3. Suspected postsurgical changes involving both menisci as
described above. Correlate with prior imaging and surgical
procedures.
4. The knee ligaments appear intact.

## 2021-08-03 ENCOUNTER — Ambulatory Visit: Payer: BC Managed Care – PPO | Admitting: Anesthesiology

## 2021-08-03 ENCOUNTER — Encounter: Payer: Self-pay | Admitting: Surgery

## 2021-08-03 ENCOUNTER — Encounter
Admission: RE | Disposition: A | Discharge status: Home / Self Care | Payer: Self-pay | Source: Home / Self Care | Attending: Surgery

## 2021-08-03 ENCOUNTER — Inpatient Hospital Stay
Admission: RE | Admit: 2021-08-03 | Discharge: 2021-08-10 | DRG: 478 | Disposition: A | Discharge status: Home Health | Payer: BC Managed Care – PPO | Attending: Surgery | Admitting: Surgery

## 2021-08-03 DIAGNOSIS — I10 Essential (primary) hypertension: Secondary | ICD-10-CM | POA: Diagnosis present

## 2021-08-03 DIAGNOSIS — Z880 Allergy status to penicillin: Secondary | ICD-10-CM | POA: Diagnosis not present

## 2021-08-03 DIAGNOSIS — M109 Gout, unspecified: Secondary | ICD-10-CM | POA: Diagnosis present

## 2021-08-03 DIAGNOSIS — Z6838 Body mass index (BMI) 38.0-38.9, adult: Secondary | ICD-10-CM | POA: Diagnosis not present

## 2021-08-03 DIAGNOSIS — M869 Osteomyelitis, unspecified: Secondary | ICD-10-CM | POA: Diagnosis present

## 2021-08-03 DIAGNOSIS — E669 Obesity, unspecified: Secondary | ICD-10-CM | POA: Diagnosis present

## 2021-08-03 DIAGNOSIS — B9561 Methicillin susceptible Staphylococcus aureus infection as the cause of diseases classified elsewhere: Secondary | ICD-10-CM | POA: Diagnosis present

## 2021-08-03 DIAGNOSIS — M1712 Unilateral primary osteoarthritis, left knee: Secondary | ICD-10-CM | POA: Diagnosis present

## 2021-08-03 DIAGNOSIS — Z8249 Family history of ischemic heart disease and other diseases of the circulatory system: Secondary | ICD-10-CM

## 2021-08-03 DIAGNOSIS — L280 Lichen simplex chronicus: Secondary | ICD-10-CM | POA: Diagnosis present

## 2021-08-03 DIAGNOSIS — M00062 Staphylococcal arthritis, left knee: Secondary | ICD-10-CM | POA: Diagnosis present

## 2021-08-03 DIAGNOSIS — Z833 Family history of diabetes mellitus: Secondary | ICD-10-CM | POA: Diagnosis not present

## 2021-08-03 DIAGNOSIS — M25562 Pain in left knee: Secondary | ICD-10-CM | POA: Diagnosis present

## 2021-08-03 DIAGNOSIS — M009 Pyogenic arthritis, unspecified: Secondary | ICD-10-CM | POA: Diagnosis present

## 2021-08-03 HISTORY — PX: KNEE ARTHROSCOPY: SHX127

## 2021-08-03 LAB — BASIC METABOLIC PANEL
Anion gap: 10 (ref 5–15)
BUN: 15 mg/dL (ref 6–20)
CO2: 28 mmol/L (ref 22–32)
Calcium: 9.2 mg/dL (ref 8.9–10.3)
Chloride: 92 mmol/L — ABNORMAL LOW (ref 98–111)
Creatinine, Ser: 0.71 mg/dL (ref 0.44–1.00)
GFR, Estimated: >60 mL/min (ref >60)
Glucose, Bld: 192 mg/dL — ABNORMAL HIGH (ref 70–99)
Potassium: 3.7 mmol/L (ref 3.5–5.1)
Sodium: 130 mmol/L — ABNORMAL LOW (ref 135–145)

## 2021-08-03 SURGERY — ARTHROSCOPY, KNEE
Anesthesia: General | Site: Knee | Laterality: Left

## 2021-08-03 MED ORDER — LIDOCAINE HCL (CARDIAC) PF 100 MG/5ML IV SOSY
PREFILLED_SYRINGE | INTRAVENOUS | Status: DC | PRN
  Administered 2021-08-03: 100 mg via INTRAVENOUS

## 2021-08-03 MED ORDER — HYDROMORPHONE HCL 1 MG/ML IJ SOLN
INTRAMUSCULAR | Status: AC
  Filled 2021-08-03: qty 1

## 2021-08-03 MED ORDER — MIDAZOLAM HCL 2 MG/2ML IJ SOLN
INTRAMUSCULAR | Status: AC
  Filled 2021-08-03: qty 2

## 2021-08-03 MED ORDER — NEOMYCIN-POLYMYXIN B GU 40-200000 IR SOLN
Status: DC | PRN
  Administered 2021-08-03: 12 mL

## 2021-08-03 MED ORDER — NEOMYCIN-POLYMYXIN B GU 40-200000 IR SOLN
Status: AC
  Filled 2021-08-03: qty 20

## 2021-08-03 MED ORDER — BUPIVACAINE HCL (PF) 0.5 % IJ SOLN
INTRAMUSCULAR | Status: AC
  Filled 2021-08-03: qty 30

## 2021-08-03 MED ORDER — TRAMADOL HCL 50 MG PO TABS
50.0000 mg | ORAL_TABLET | Freq: Four times a day (QID) | ORAL | Status: DC | PRN
  Administered 2021-08-10: 50 mg via ORAL
  Filled 2021-08-03: qty 1

## 2021-08-03 MED ORDER — LACTATED RINGERS IV SOLN: INTRAVENOUS | Status: DC

## 2021-08-03 MED ORDER — GLYCOPYRROLATE 0.2 MG/ML IJ SOLN
INTRAMUSCULAR | Status: DC | PRN
  Administered 2021-08-03: .2 mg via INTRAVENOUS

## 2021-08-03 MED ORDER — CLINDAMYCIN PHOSPHATE 900 MG/50ML IV SOLN: 900.0000 mg | INTRAVENOUS | Status: DC

## 2021-08-03 MED ORDER — FENTANYL CITRATE (PF) 100 MCG/2ML IJ SOLN
INTRAMUSCULAR | Status: AC
  Filled 2021-08-03: qty 2

## 2021-08-03 MED ORDER — HYDROMORPHONE HCL 1 MG/ML IJ SOLN
INTRAMUSCULAR | Status: DC | PRN
  Administered 2021-08-03 (×2): .5 mg via INTRAVENOUS

## 2021-08-03 MED ORDER — HYDROMORPHONE HCL 1 MG/ML IJ SOLN: 0.2500 mg | INTRAMUSCULAR | Status: DC | PRN

## 2021-08-03 MED ORDER — ONDANSETRON HCL 4 MG PO TABS: 4.0000 mg | ORAL_TABLET | Freq: Four times a day (QID) | ORAL | Status: DC | PRN

## 2021-08-03 MED ORDER — SODIUM CHLORIDE 0.9 % IV SOLN: INTRAVENOUS | Status: DC

## 2021-08-03 MED ORDER — OXYCODONE HCL 5 MG PO TABS
5.0000 mg | ORAL_TABLET | ORAL | Status: DC | PRN
  Administered 2021-08-03: 10 mg via ORAL
  Administered 2021-08-04: 5 mg via ORAL
  Administered 2021-08-05 – 2021-08-06 (×5): 10 mg via ORAL
  Administered 2021-08-06: 5 mg via ORAL
  Administered 2021-08-06 – 2021-08-10 (×6): 10 mg via ORAL
  Filled 2021-08-03 (×3): qty 2
  Filled 2021-08-03: qty 1
  Filled 2021-08-03 (×10): qty 2

## 2021-08-03 MED ORDER — VANCOMYCIN HCL IN DEXTROSE 1-5 GM/200ML-% IV SOLN
INTRAVENOUS | Status: AC
  Filled 2021-08-03: qty 200

## 2021-08-03 MED ORDER — ACETAMINOPHEN 325 MG PO TABS
325.0000 mg | ORAL_TABLET | Freq: Four times a day (QID) | ORAL | Status: DC | PRN
  Administered 2021-08-06: 650 mg via ORAL
  Filled 2021-08-03: qty 2

## 2021-08-03 MED ORDER — VANCOMYCIN HCL 1000 MG IV SOLR
INTRAVENOUS | Status: DC | PRN
  Administered 2021-08-03: 1000 mg via TOPICAL

## 2021-08-03 MED ORDER — CHLORHEXIDINE GLUCONATE 0.12 % MT SOLN: 15.0000 mL | Freq: Once | OROMUCOSAL | Status: DC

## 2021-08-03 MED ORDER — METOCLOPRAMIDE HCL 10 MG PO TABS: 5.0000 mg | ORAL_TABLET | Freq: Three times a day (TID) | ORAL | Status: DC | PRN

## 2021-08-03 MED ORDER — ALLOPURINOL 100 MG PO TABS
100.0000 mg | ORAL_TABLET | Freq: Every day | ORAL | Status: DC
  Administered 2021-08-03 – 2021-08-10 (×7): 100 mg via ORAL
  Filled 2021-08-03 (×8): qty 1

## 2021-08-03 MED ORDER — PROPOFOL 10 MG/ML IV BOLUS
INTRAVENOUS | Status: DC | PRN
  Administered 2021-08-03: 200 mg via INTRAVENOUS

## 2021-08-03 MED ORDER — MIDAZOLAM HCL 2 MG/2ML IJ SOLN
INTRAMUSCULAR | Status: DC | PRN
  Administered 2021-08-03: 2 mg via INTRAVENOUS

## 2021-08-03 MED ORDER — DEXMEDETOMIDINE (PRECEDEX) IN NS 20 MCG/5ML (4 MCG/ML) IV SYRINGE
PREFILLED_SYRINGE | INTRAVENOUS | Status: DC | PRN
  Administered 2021-08-03: 4 ug via INTRAVENOUS
  Administered 2021-08-03 (×2): 8 ug via INTRAVENOUS

## 2021-08-03 MED ORDER — HYDROCHLOROTHIAZIDE 25 MG PO TABS
25.0000 mg | ORAL_TABLET | Freq: Every day | ORAL | Status: DC
  Administered 2021-08-05 – 2021-08-10 (×5): 25 mg via ORAL
  Filled 2021-08-03 (×6): qty 1

## 2021-08-03 MED ORDER — ACETAMINOPHEN 500 MG PO TABS
1000.0000 mg | ORAL_TABLET | Freq: Four times a day (QID) | ORAL | Status: AC
  Administered 2021-08-03 – 2021-08-04 (×4): 1000 mg via ORAL
  Filled 2021-08-03 (×4): qty 2

## 2021-08-03 MED ORDER — ENOXAPARIN SODIUM 40 MG/0.4ML IJ SOSY
40.0000 mg | PREFILLED_SYRINGE | INTRAMUSCULAR | Status: DC
  Administered 2021-08-04 – 2021-08-07 (×4): 40 mg via SUBCUTANEOUS
  Filled 2021-08-03 (×4): qty 0.4

## 2021-08-03 MED ORDER — ORAL CARE MOUTH RINSE: 15.0000 mL | Freq: Once | OROMUCOSAL | Status: DC

## 2021-08-03 MED ORDER — PAROXETINE HCL 20 MG PO TABS
20.0000 mg | ORAL_TABLET | Freq: Every day | ORAL | Status: DC
  Administered 2021-08-04 – 2021-08-10 (×6): 20 mg via ORAL
  Filled 2021-08-03 (×6): qty 1

## 2021-08-03 MED ORDER — VANCOMYCIN HCL 1000 MG IV SOLR
INTRAVENOUS | Status: AC
  Filled 2021-08-03: qty 20

## 2021-08-03 MED ORDER — VANCOMYCIN HCL IN DEXTROSE 1-5 GM/200ML-% IV SOLN
1000.0000 mg | Freq: Once | INTRAVENOUS | Status: AC
  Administered 2021-08-03: 1000 mg via INTRAVENOUS
  Filled 2021-08-03: qty 200

## 2021-08-03 MED ORDER — PROPRANOLOL HCL 20 MG PO TABS
20.0000 mg | ORAL_TABLET | Freq: Every day | ORAL | Status: DC
  Administered 2021-08-04 – 2021-08-10 (×7): 20 mg via ORAL
  Filled 2021-08-03 (×7): qty 1

## 2021-08-03 MED ORDER — METOCLOPRAMIDE HCL 5 MG/ML IJ SOLN: 5.0000 mg | Freq: Three times a day (TID) | INTRAMUSCULAR | Status: DC | PRN

## 2021-08-03 MED ORDER — FLEET ENEMA 7-19 GM/118ML RE ENEM: 1.0000 | ENEMA | Freq: Once | RECTAL | Status: DC | PRN

## 2021-08-03 MED ORDER — SODIUM CHLORIDE FLUSH 0.9 % IV SOLN
INTRAVENOUS | Status: AC
  Filled 2021-08-03: qty 20

## 2021-08-03 MED ORDER — DIPHENHYDRAMINE HCL 12.5 MG/5ML PO ELIX: 12.5000 mg | ORAL_SOLUTION | ORAL | Status: DC | PRN

## 2021-08-03 MED ORDER — CHLORHEXIDINE GLUCONATE 0.12 % MT SOLN
OROMUCOSAL | Status: AC
  Filled 2021-08-03: qty 15

## 2021-08-03 MED ORDER — CLINDAMYCIN PHOSPHATE 900 MG/50ML IV SOLN
INTRAVENOUS | Status: AC
  Filled 2021-08-03: qty 50

## 2021-08-03 MED ORDER — ROSUVASTATIN CALCIUM 10 MG PO TABS
10.0000 mg | ORAL_TABLET | Freq: Every day | ORAL | Status: DC
  Administered 2021-08-03 – 2021-08-10 (×7): 10 mg via ORAL
  Filled 2021-08-03 (×7): qty 1

## 2021-08-03 MED ORDER — DEXAMETHASONE SODIUM PHOSPHATE 10 MG/ML IJ SOLN
INTRAMUSCULAR | Status: DC | PRN
  Administered 2021-08-03: 10 mg via INTRAVENOUS

## 2021-08-03 MED ORDER — MAGNESIUM OXIDE -MG SUPPLEMENT 400 (240 MG) MG PO TABS
200.0000 mg | ORAL_TABLET | Freq: Every day | ORAL | Status: DC
  Administered 2021-08-03 – 2021-08-07 (×5): 200 mg via ORAL
  Filled 2021-08-03 (×5): qty 1

## 2021-08-03 MED ORDER — RINGERS IRRIGATION IR SOLN
Status: DC | PRN
  Administered 2021-08-03: 9000 mL

## 2021-08-03 MED ORDER — KETOROLAC TROMETHAMINE 15 MG/ML IJ SOLN
15.0000 mg | Freq: Once | INTRAMUSCULAR | Status: DC
Start: 2021-08-03 — End: 2021-08-03

## 2021-08-03 MED ORDER — DOCUSATE SODIUM 100 MG PO CAPS
100.0000 mg | ORAL_CAPSULE | Freq: Two times a day (BID) | ORAL | Status: DC
  Administered 2021-08-03 – 2021-08-07 (×9): 100 mg via ORAL
  Filled 2021-08-03 (×9): qty 1

## 2021-08-03 MED ORDER — METHOCARBAMOL 500 MG PO TABS
750.0000 mg | ORAL_TABLET | Freq: Three times a day (TID) | ORAL | Status: DC | PRN
  Administered 2021-08-04 – 2021-08-10 (×6): 750 mg via ORAL
  Filled 2021-08-03 (×6): qty 2

## 2021-08-03 MED ORDER — BISACODYL 10 MG RE SUPP: 10.0000 mg | Freq: Every day | RECTAL | Status: DC | PRN

## 2021-08-03 MED ORDER — VANCOMYCIN HCL 750 MG/150ML IV SOLN
750.0000 mg | Freq: Two times a day (BID) | INTRAVENOUS | Status: DC
  Filled 2021-08-03: qty 150

## 2021-08-03 MED ORDER — MAGNESIUM HYDROXIDE 400 MG/5ML PO SUSP
30.0000 mL | Freq: Every day | ORAL | Status: DC | PRN
  Administered 2021-08-04: 30 mL via ORAL
  Filled 2021-08-03: qty 30

## 2021-08-03 MED ORDER — KETOROLAC TROMETHAMINE 15 MG/ML IJ SOLN
7.5000 mg | Freq: Four times a day (QID) | INTRAMUSCULAR | Status: AC
  Administered 2021-08-03 – 2021-08-04 (×4): 7.5 mg via INTRAVENOUS
  Filled 2021-08-03 (×4): qty 1

## 2021-08-03 MED ORDER — ONDANSETRON HCL 4 MG/2ML IJ SOLN: 4.0000 mg | Freq: Once | INTRAMUSCULAR | Status: DC | PRN

## 2021-08-03 MED ORDER — ONDANSETRON HCL 4 MG/2ML IJ SOLN
INTRAMUSCULAR | Status: DC | PRN
  Administered 2021-08-03 (×2): 4 mg via INTRAVENOUS

## 2021-08-03 MED ORDER — ONDANSETRON HCL 4 MG/2ML IJ SOLN
4.0000 mg | Freq: Four times a day (QID) | INTRAMUSCULAR | Status: DC | PRN
  Administered 2021-08-08: 4 mg via INTRAVENOUS

## 2021-08-03 MED ORDER — VANCOMYCIN HCL 1000 MG IV SOLR
INTRAVENOUS | Status: DC | PRN
  Administered 2021-08-03: 1000 mg via INTRAVENOUS

## 2021-08-03 MED ORDER — FENTANYL CITRATE (PF) 100 MCG/2ML IJ SOLN
25.0000 ug | INTRAMUSCULAR | Status: DC | PRN
  Administered 2021-08-03 (×2): 25 ug via INTRAVENOUS

## 2021-08-03 MED ORDER — FENTANYL CITRATE (PF) 100 MCG/2ML IJ SOLN
INTRAMUSCULAR | Status: DC | PRN
  Administered 2021-08-03: 25 ug via INTRAVENOUS
  Administered 2021-08-03: 50 ug via INTRAVENOUS
  Administered 2021-08-03: 25 ug via INTRAVENOUS

## 2021-08-03 MED ORDER — BUPIVACAINE-EPINEPHRINE (PF) 0.5% -1:200000 IJ SOLN
INTRAMUSCULAR | Status: DC | PRN
  Administered 2021-08-03: 20 mL

## 2021-08-03 SURGICAL SUPPLY — 47 items
APL PRP STRL LF DISP 70% ISPRP (MISCELLANEOUS) ×1
BAG COUNTER SPONGE SURGICOUNT (BAG) IMPLANT
BAG SPNG CNTER NS LX DISP (BAG)
BLADE FULL RADIUS 3.5 (BLADE) ×2 IMPLANT
BLADE SHAVER 4.5X7 STR FR (MISCELLANEOUS) ×2 IMPLANT
BNDG ELASTIC 6X5.8 VLCR STR LF (GAUZE/BANDAGES/DRESSINGS) ×2 IMPLANT
BNDG ESMARK 6X12 TAN STRL LF (GAUZE/BANDAGES/DRESSINGS) ×2 IMPLANT
CHLORAPREP W/TINT 26 (MISCELLANEOUS) ×2 IMPLANT
CNTNR SPEC 2.5X3XGRAD LEK (MISCELLANEOUS) ×2
COLLECTOR GRAFT TISSUE (SYSTAGENIX WOUND MANAGEMENT) ×2
CONT SPEC 4OZ STER OR WHT (MISCELLANEOUS) ×2
CONT SPEC 4OZ STRL OR WHT (MISCELLANEOUS) ×2
CONTAINER SPEC 2.5X3XGRAD LEK (MISCELLANEOUS) IMPLANT
CUFF TOURN SGL QUICK 24 (TOURNIQUET CUFF)
CUFF TOURN SGL QUICK 34 (TOURNIQUET CUFF)
CUFF TRNQT CYL 24X4X16.5-23 (TOURNIQUET CUFF) IMPLANT
CUFF TRNQT CYL 34X4.125X (TOURNIQUET CUFF) IMPLANT
DRAPE IMP U-DRAPE 54X76 (DRAPES) ×2 IMPLANT
ELECT REM PT RETURN 9FT ADLT (ELECTROSURGICAL) ×2
ELECTRODE REM PT RTRN 9FT ADLT (ELECTROSURGICAL) ×1 IMPLANT
GAUZE SPONGE 4X4 12PLY STRL (GAUZE/BANDAGES/DRESSINGS) ×2 IMPLANT
GLOVE SURG ENC MOIS LTX SZ8 (GLOVE) ×4 IMPLANT
GLOVE SURG ENC TEXT LTX SZ7 (GLOVE) ×4 IMPLANT
GLOVE SURG UNDER LTX SZ8 (GLOVE) ×2 IMPLANT
GLOVE SURG UNDER POLY LF SZ7.5 (GLOVE) ×2 IMPLANT
GOWN STRL REUS W/ TWL LRG LVL3 (GOWN DISPOSABLE) ×1 IMPLANT
GOWN STRL REUS W/ TWL XL LVL3 (GOWN DISPOSABLE) ×2 IMPLANT
GOWN STRL REUS W/TWL LRG LVL3 (GOWN DISPOSABLE) ×2
GOWN STRL REUS W/TWL XL LVL3 (GOWN DISPOSABLE) ×4
HEMOVAC LRG 3/16 (MISCELLANEOUS) ×1 IMPLANT
IV LACTATED RINGER IRRG 3000ML (IV SOLUTION) ×6
IV LR IRRIG 3000ML ARTHROMATIC (IV SOLUTION) ×1 IMPLANT
KIT TURNOVER KIT A (KITS) ×2 IMPLANT
MANIFOLD NEPTUNE II (INSTRUMENTS) ×4 IMPLANT
NDL HYPO 21X1.5 SAFETY (NEEDLE) ×1 IMPLANT
NEEDLE HYPO 21X1.5 SAFETY (NEEDLE) ×2 IMPLANT
PACK ARTHROSCOPY KNEE (MISCELLANEOUS) ×2 IMPLANT
PENCIL ELECTRO HAND CTR (MISCELLANEOUS) ×2 IMPLANT
SHAVER BLADE INCISOR PLUS 4.5 (BLADE) ×1 IMPLANT
SPONGE T-LAP 18X18 ~~LOC~~+RFID (SPONGE) ×2 IMPLANT
SUT PROLENE 4 0 PS 2 18 (SUTURE) ×3 IMPLANT
SUT TICRON COATED BLUE 2 0 30 (SUTURE) IMPLANT
SYR 50ML LL SCALE MARK (SYRINGE) ×2 IMPLANT
TISSUE GRAFT COLLECTOR (SYSTAGENIX WOUND MANAGEMENT) IMPLANT
TUBING INFLOW SET DBFLO PUMP (TUBING) ×2 IMPLANT
WAND WEREWOLF FLOW 90D (MISCELLANEOUS) ×2 IMPLANT
WATER STERILE IRR 500ML POUR (IV SOLUTION) ×1 IMPLANT

## 2021-08-03 NOTE — OR Nursing (Signed)
DRESSING REINFORCED WITH ABD PAD X2 AND ACE BANDAGE DUE TO BLEEDING THROUGH PRIMARY DRESSING BY DR POGGI.

## 2021-08-03 NOTE — Op Note (Addendum)
08/03/2021  3:57 PM  Patient:   Krista Blake  Pre-Op Diagnosis:   Acute septic arthritis with underlying degenerative joint disease, left knee.  Postoperative diagnosis:   Same  Procedure:   Arthroscopic irrigation and debridement with bone biopsies x2  Surgeon:   Pascal Lux, MD  Anesthesia:   General LMA  Findings:   As above.  There were extensive grade 3-4 chondromalacial changes noted throughout the knee with focal osteochondral injury defects involving the medial edge of the medial tibial plateau, the central portion of the lateral femoral condyle, and the lateral wall of the lateral femoral condyle.  The lateral meniscus was in satisfactory condition, as were the anterior and posterior cruciate ligaments.  Complications:   None  EBL:   10 cc.  Total fluids:   500 cc of crystalloid.  Tourniquet time:   65 minutes at 300 mmHg  Drains:   Large Hemovac drain x1  Closure:   4-0 Prolene interrupted sutures.  Brief clinical note:   The patient is a 55 year old female who has undergone two arthroscopic irrigation and debridements for septic arthritis of her left knee, the most recent of which was 6 weeks ago.  The patient was placed on 4 weeks of IV antibiotics and appear to be doing quite well.  However, 5 days ago, the patient developed increased pain and swelling in her knee.  An aspiration was performed which was concerning for recurrent septic arthritis.  The patient was sent for a stat MRI scan which confirmed the presence of osteomyelitis involving the distal femur and proximal tibia.  The patient presents at this time for a repeat extensive arthroscopic irrigation and debridement of her knee with plans to obtain bone biopsies.  Procedure:   The patient was brought into the operating room and lain in the supine position.  After adequate general laryngeal mask anesthesia was obtained, the left lower extremity was prepped with ChloraPrep solution before being draped  sterilely.  Preoperative antibiotics were administered.  A timeout was performed to verify the appropriate surgical site before the expected portal sites were injected with 0.5% Sensorcaine with epinephrine.  The leg was elevated for 1 to 2 minutes before inflating the tourniquet to 300 mmHg.  The camera was placed in the anterolateral portal and instrumentation performed through the anteromedial portal.   A culture was obtained upon introduction of the arthroscopic cannula before the knee was sequentially examined beginning in the suprapatellar pouch, then progressing to the patellofemoral space, the medial gutter and compartment, the notch, and finally the lateral compartment and gutter. The findings were as described above. Abundant reactive synovial tissues throughout the knee were debrided, including the suprapatellar pouch, the medial and lateral gutters, the notch, and in the posterior aspects of the knee, including the popliteal hiatus.  A separate superolateral portal site was created to optimize access to all portions of the knee.    The knee was irrigated thoroughly using 3 L of sterile saline solution followed by 3 L of antibiotic impregnated saline solution, and then another 3 L of sterile saline solution.  In addition, bone biopsies were obtained of both the lateral femoral condyle and the medial tibial plateau which were sent for culture and sensitivity.  The instruments were removed from the joint after suctioning the excess fluid.   The portal sites were closed using 4-0 Prolene interrupted sutures before a sterile bulky dressing was applied to the knee.  Of note, 1 g of vancomycin in 10 cc  of saline was injected into the knee at the end of the case prior to application of the dressing.  In addition, a large Hemovac drain was placed through the superolateral portal site prior to closure.  The suction was left off the drain for the first 6 hours or so in order to allow the vancomycin to stay in  the joint for as long as possible.  The patient was then awakened, extubated, and returned to the recovery room in satisfactory condition after tolerating the procedure well.

## 2021-08-03 NOTE — Anesthesia Procedure Notes (Signed)
Procedure Name: LMA Insertion Date/Time: 08/03/2021 2:10 PM Performed by: Kelton Pillar, CRNA Pre-anesthesia Checklist: Patient identified, Emergency Drugs available, Suction available and Patient being monitored Patient Re-evaluated:Patient Re-evaluated prior to induction Oxygen Delivery Method: Circle system utilized Preoxygenation: Pre-oxygenation with 100% oxygen Induction Type: IV induction Ventilation: Mask ventilation without difficulty LMA: LMA inserted and LMA flexible inserted LMA Size: 3.5 Placement Confirmation: positive ETCO2, CO2 detector and breath sounds checked- equal and bilateral Tube secured with: Tape Dental Injury: Teeth and Oropharynx as per pre-operative assessment

## 2021-08-03 NOTE — Plan of Care (Signed)
  Problem: Education: Goal: Knowledge of General Education information will improve Description: Including pain rating scale, medication(s)/side effects and non-pharmacologic comfort measures Outcome: Progressing   Problem: Health Behavior/Discharge Planning: Goal: Ability to manage health-related needs will improve Outcome: Progressing   Problem: Clinical Measurements: Goal: Ability to maintain clinical measurements within normal limits will improve Outcome: Progressing Goal: Will remain free from infection Outcome: Progressing Goal: Diagnostic test results will improve Outcome: Progressing Goal: Respiratory complications will improve Outcome: Progressing Goal: Cardiovascular complication will be avoided Outcome: Progressing  Pt s/p knee debridement - left - A/O x 3 - LFA IV infusing NS at 75 - pt denies acute needs.

## 2021-08-03 NOTE — Consult Note (Signed)
Pharmacy Antibiotic Note  Krista Blake is a 55 y.o. female admitted on 08/03/2021 with acute onset left knee pai s/p Left knee arthroscopy (05/04/2021) and repeat irrigation debridement for septic arthritis (06/07/2021) The patient recently completed 6 weeks of IV antibiotics, after her IV antibiotic treatment the patient was also treated with 2 weeks of oral Keflex. Previous cultures grew MSSA both 04/2021 and 05/2021. Pharmacy has been consulted for Vancomycin dosing for septic arthritis. 10/12: Arthroscopic irrigation and debridement with bone biopsies, Vancomycin 1000 mg powder given intra-procedure  Plan: Vancomycin 2000 mg IV LD x 1  Initiate Vancomycin 750 mg Q12H. Goal AUC 400-550 Estimated AUC 464/Cmin: 13.8 Scr 0.8, IBW, Vd 0.5 Follow up surgery culture results Vancomycin levels at steady state     Weight: 103 kg (227 lb 1.2 oz)  Temp (24hrs), Avg:98.1 F (36.7 C), Min:97.2 F (36.2 C), Max:98.8 F (37.1 C)  No results for input(s): WBC, CREATININE, LATICACIDVEN, VANCOTROUGH, VANCOPEAK, VANCORANDOM, GENTTROUGH, GENTPEAK, GENTRANDOM, TOBRATROUGH, TOBRAPEAK, TOBRARND, AMIKACINPEAK, AMIKACINTROU, AMIKACIN in the last 168 hours.  Estimated Creatinine Clearance: 95 mL/min (by C-G formula based on SCr of 0.66 mg/dL).    Allergies  Allergen Reactions   Penicillins Hives    Reaction : Hives during college TOLERATED CEFAZOLIN AND AMOXICILLIN    Antimicrobials this admission: 10/12 vancomycin >>   Dose adjustments this admission: N/a  Microbiology results: 10/12: Surgical tissue wound cx: sent  10/12:Wound body fluid cx: sent    Thank you for allowing pharmacy to be a part of this patient's care.  Dorothe Pea, PharmD, BCPS Clinical Pharmacist   08/03/2021 6:22 PM

## 2021-08-03 NOTE — Anesthesia Preprocedure Evaluation (Signed)
Anesthesia Evaluation  Patient identified by MRN, date of birth, ID band Patient awake    Reviewed: Allergy & Precautions, H&P , NPO status , Patient's Chart, lab work & pertinent test results, reviewed documented beta blocker date and time   Airway Mallampati: II  TM Distance: >3 FB Neck ROM: full    Dental  (+) Teeth Intact   Pulmonary neg pulmonary ROS,    Pulmonary exam normal        Cardiovascular Exercise Tolerance: Good hypertension, On Medications negative cardio ROS Normal cardiovascular exam Rate:Normal     Neuro/Psych negative neurological ROS  negative psych ROS   GI/Hepatic negative GI ROS, Neg liver ROS,   Endo/Other  negative endocrine ROS  Renal/GU negative Renal ROS  negative genitourinary   Musculoskeletal   Abdominal   Peds  Hematology negative hematology ROS (+)   Anesthesia Other Findings   Reproductive/Obstetrics negative OB ROS                             Anesthesia Physical Anesthesia Plan  ASA: 2  Anesthesia Plan: General LMA   Post-op Pain Management:    Induction:   PONV Risk Score and Plan:   Airway Management Planned:   Additional Equipment:   Intra-op Plan:   Post-operative Plan:   Informed Consent: I have reviewed the patients History and Physical, chart, labs and discussed the procedure including the risks, benefits and alternatives for the proposed anesthesia with the patient or authorized representative who has indicated his/her understanding and acceptance.       Plan Discussed with: CRNA  Anesthesia Plan Comments:         Anesthesia Quick Evaluation

## 2021-08-03 NOTE — H&P (Signed)
History of Present Illness: Krista Blake is a 55 y.o. female who presents today for evaluation of acute onset left knee pain. The patient is status post a left knee arthroscopy and repeat irrigation debridement for septic arthritis. The patient recently completed 6 weeks of IV antibiotics, after her IV antibiotic treatment the patient was also treated with 2 weeks of oral Keflex. The patient states that recently without any trauma or injury she began to notice increased pain in her left knee and contact the clinic for evaluation. The patient has continued to perform physical therapy however over the past 24-40 hours she states that the pain in the left knee has become more severe. She denies any numbness or tingling to left lower extremity. She denies any fevers or chills at home. She has not noticed any heat or skin changes surrounding left knee. Patient presents ambulatory reports that her pain is a 10 out of 10 in severity.  Past Medical History:  Chicken pox   HTN (hypertension)   Past Surgical History:  Arthroscopic irrigation and debridement with extensive synovectomy, partial medial and lateral meniscectomies, and abrasion chondroplasty of grade III chondromalacia femoral trochlea, left knee Left 05/04/2021 (Dr. Roland Rack)   Arthroscopic irrigation debridement with extensive debridement and abrasion chondroplasty of medial femoral condyle, lateral femoral condyle, and patella, left knee Left 06/07/2021 (Dr. Roland Rack)   Past Family History:  High blood pressure (Hypertension) Father   High blood pressure (Hypertension) Paternal Grandfather   No Known Problems Mother   No Known Problems Sister   No Known Problems Brother   Medications:  acetaminophen (TYLENOL) 500 MG tablet Take 1,000 mg by mouth as needed for Pain   allopurinoL (ZYLOPRIM) 100 MG tablet Take 100 mg by mouth once daily   hydroCHLOROthiazide (HYDRODIURIL) 25 MG tablet TAKE 1 TABLET BY MOUTH EVERY DAY 90 tablet 3    HYDROcodone-acetaminophen (NORCO) 5-325 mg tablet Take 1 tablet by mouth every 6 (six) hours as needed for Pain for up to 20 doses (Patient taking differently: Take 1 tablet by mouth every 4 (four) hours as needed for Pain) 20 tablet 0   magnesium oxide (MAG-OX) 400 mg (241.3 mg magnesium) tablet Take 400 mg by mouth once daily   methocarbamoL (ROBAXIN) 750 MG tablet Take 1 tablet (750 mg total) by mouth 3 (three) times daily 30 tablet 1   PARoxetine (PAXIL) 20 MG tablet TAKE 1 TABLET BY MOUTH EVERY DAY 90 tablet 1   propranoloL (INDERAL) 40 MG tablet Take 1 tablet (40 mg total) by mouth 2 (two) times daily 180 tablet 1   rosuvastatin (CRESTOR) 10 MG tablet TAKE 1 TABLET BY MOUTH EVERY DAY 90 tablet 3   cephalexin (KEFLEX) 500 MG capsule (Patient not taking: Reported on 07/29/2021)   ondansetron (ZOFRAN) 4 MG tablet TAKE 1 TABLET (4 MG TOTAL) BY MOUTH EVERY 6 (SIX) HOURS AS NEEDED FOR NAUSEA. (Patient not taking: Reported on 07/29/2021)   phentermine (ADIPEX-P) 37.5 mg tablet TAKE 1 TABLET BY MOUTH EVERY MORNING BEFORE BREAKFAST FOR 30 DAYS. (Patient not taking: Reported on 07/29/2021)   Allergies:  Penicillins Hives during college (TOLERATED CEFAZOLIN AND AMOXICILLIN)   Review of Systems:  A comprehensive 14 point ROS was performed, reviewed by me today, and the pertinent orthopaedic findings are documented in the HPI.  Physical Exam: BP 122/80  Temp 37.1 C (98.8 F)  Ht 162 cm (5' 3.78")  Wt (!) 101.6 kg (224 lb)  LMP (LMP Unknown)  BMI 38.72 kg/m  General/Constitutional: The  patient appears to be well-nourished, well-developed, and in no acute distress. Neuro/Psych: Normal mood and affect, oriented to person, place and time. Eyes: Non-icteric. Pupils are equal, round, and reactive to light, and exhibit synchronous movement. ENT: Unremarkable. Lymphatic: No palpable adenopathy. Respiratory: Lungs clear to auscultation, Normal chest excursion, No wheezes and Non-labored  breathing Cardiovascular: Regular rate and rhythm. No murmurs. and No edema, swelling or tenderness, except as noted in detailed exam. Integumentary: No impressive skin lesions present, except as noted in detailed exam. Musculoskeletal: Unremarkable, except as noted in detailed exam.  Left knee exam: The patient presents today in a wheelchair. On inspection, her arthroscopic portal sites to be well-healed without evidence for infection. Her arthroscopic portal sites are well-healed and without evidence for infection.  There is mild swelling around the knee, as well as a trace effusion, but no erythema or other more acute skin abnormalities are identified.  She again exhibits chronic scarring/scabbing due to her history of neurodermatitis.  Actively, the knee lacks 25 degrees of extension but can be flexed to 80 degrees.  The knee is stable to varus and valgus stressing.  She remains neurovascularly intact to the left lower extremity and foot. The patient does report moderate pain with range of motion activities at today's visit.  Imaging: AP, lateral and sunrise views of the right knee were obtained today in the office and reviewed by me. These x-rays demonstrate advancement of the medial compartment osteoarthritic changes with bone-on-bone appearance in the medial compartment. Increased arthritic changes to the patellofemoral compartment as well with decreased patellofemoral joint space. There does not appear to be in the evidence of cute fracture or lytic lesions. Mild left knee effusion noted on x-ray.  Impression: Staphylococcal arthritis of left knee. Primary osteoarthritis of left knee.  Plan:  1. Treatment options were discussed today with the patient. 2. The patient presents today with acute onset of left knee pain status post 2 separate irrigation and debridements of the left knee for underlying septic arthritis in the past. 3. We will obtain CBC, ESR, CRP following today's visit. There is  no active signs of infection at this time. The patient has been diagnosed with gout in the past as well, questionable gout flare versus underlying arthritic changes versus septic knee. 4. Labs were reviewed on 08/01/2021, CRP elevated at 40 and ESR within normal range and no elevated white count. We will bring the patient into undergo a left knee aspiration under ultrasound guidance which will be sent off for crystal analysis, gram stain, cell count and culture. 5. They can call the clinic they have any questions, new symptoms develop or symptoms worsen.  Addendum 08/02/21 After the patient was evaluated this past Friday she was sent for routine blood work. Routine blood work did return demonstrating elevated CRP at 40. As a precaution the patient was scheduled to undergo a ultrasound-guided aspiration which was performed yesterday. The initial results demonstrated no evidence of crystals within the left knee and a white count of [phone]. With the patient already undergoing 2 separate irrigation and debridements, discussed with Dr. Roland Rack. The patient will be scheduled for a repeat irrigation and debridement of the left knee. I have called and spoke with the patient and informed her about scheduling her for surgery tomorrow 08/03/2021. The patient was instructed on the risk and benefits, she will be admitted to the hospital for IV antibiotics. Due to the repeat nature of the left knee septic joint, MRI scan of the left knee has  also been ordered for evaluation of possible underlying osteomyelitis. The patient will undergo this MRI scan today prior to her surgery tomorrow.   H&P reviewed and patient re-examined. No changes.

## 2021-08-03 NOTE — Transfer of Care (Signed)
Immediate Anesthesia Transfer of Care Note  Patient: Krista Blake  Procedure(s) Performed: LEFT KNEE REPEAT IRRIGATION AND DEBRIDEMENT WITH BONE CULTURES. ARHTROSCOPIC (Left: Knee)  Patient Location: PACU  Anesthesia Type:General  Level of Consciousness: drowsy and patient cooperative  Airway & Oxygen Therapy: Patient Spontanous Breathing and Patient connected to face mask oxygen  Post-op Assessment: Report given to RN and Post -op Vital signs reviewed and stable  Post vital signs: Reviewed and stable  Last Vitals:  Vitals Value Taken Time  BP 154/89 08/03/21 1547  Temp    Pulse 88 08/03/21 1548  Resp 13 08/03/21 1548  SpO2 97 % 08/03/21 1548  Vitals shown include unvalidated device data.  Last Pain:  Vitals:   08/03/21 1205  TempSrc: Temporal  PainSc: 7          Complications: No notable events documented.

## 2021-08-04 ENCOUNTER — Encounter: Payer: Self-pay | Admitting: Surgery

## 2021-08-04 ENCOUNTER — Inpatient Hospital Stay: Payer: Self-pay

## 2021-08-04 LAB — BASIC METABOLIC PANEL
Anion gap: 6 (ref 5–15)
BUN: 13 mg/dL (ref 6–20)
CO2: 30 mmol/L (ref 22–32)
Calcium: 9 mg/dL (ref 8.9–10.3)
Chloride: 97 mmol/L — ABNORMAL LOW (ref 98–111)
Creatinine, Ser: 0.64 mg/dL (ref 0.44–1.00)
GFR, Estimated: >60 mL/min (ref >60)
Glucose, Bld: 169 mg/dL — ABNORMAL HIGH (ref 70–99)
Potassium: 3.9 mmol/L (ref 3.5–5.1)
Sodium: 133 mmol/L — ABNORMAL LOW (ref 135–145)

## 2021-08-04 LAB — CBC
HCT: 32.3 % — ABNORMAL LOW (ref 36.0–46.0)
Hemoglobin: 11.1 g/dL — ABNORMAL LOW (ref 12.0–15.0)
MCH: 29.1 pg (ref 26.0–34.0)
MCHC: 34.4 g/dL (ref 30.0–36.0)
MCV: 84.6 fL (ref 80.0–100.0)
Platelets: 248 10*3/uL (ref 150–400)
RBC: 3.82 MIL/uL — ABNORMAL LOW (ref 3.87–5.11)
RDW: 12.8 % (ref 11.5–15.5)
WBC: 7.3 10*3/uL (ref 4.0–10.5)
nRBC: 0 % (ref 0.0–0.2)

## 2021-08-04 MED ORDER — VANCOMYCIN HCL 750 MG/150ML IV SOLN
750.0000 mg | Freq: Two times a day (BID) | INTRAVENOUS | Status: AC
  Administered 2021-08-04 – 2021-08-07 (×7): 750 mg via INTRAVENOUS
  Filled 2021-08-04 (×7): qty 150

## 2021-08-04 MED ORDER — PROPOFOL 500 MG/50ML IV EMUL
INTRAVENOUS | Status: AC
  Filled 2021-08-04: qty 50

## 2021-08-04 MED ORDER — SODIUM CHLORIDE 0.9% FLUSH: 10.0000 mL | INTRAVENOUS | Status: DC | PRN

## 2021-08-04 MED ORDER — SODIUM CHLORIDE 0.9% FLUSH
10.0000 mL | Freq: Two times a day (BID) | INTRAVENOUS | Status: DC
  Administered 2021-08-04 – 2021-08-10 (×8): 10 mL

## 2021-08-04 MED ORDER — VANCOMYCIN HCL 750 MG/150ML IV SOLN: 750.0000 mg | Freq: Two times a day (BID) | INTRAVENOUS | Status: DC

## 2021-08-04 MED ORDER — VANCOMYCIN HCL 1500 MG/300ML IV SOLN: 1500.0000 mg | Freq: Once | INTRAVENOUS | Status: DC

## 2021-08-04 MED ORDER — CHLORHEXIDINE GLUCONATE CLOTH 2 % EX PADS
6.0000 | MEDICATED_PAD | Freq: Every day | CUTANEOUS | Status: DC
  Administered 2021-08-04 – 2021-08-09 (×6): 6 via TOPICAL

## 2021-08-04 MED ORDER — MELATONIN 5 MG PO TABS
5.0000 mg | ORAL_TABLET | Freq: Every day | ORAL | Status: DC
  Administered 2021-08-04 – 2021-08-09 (×5): 5 mg via ORAL
  Filled 2021-08-04 (×5): qty 1

## 2021-08-04 NOTE — Progress Notes (Signed)
Spoke with Krista Alamin RN re PICC order.  States to defer until Friday .  Pt currently eating supper and prefers not to be interrupted.  Krista Alamin RN states pt has adequate IV access for current IV Meds and agrees with plan.

## 2021-08-04 NOTE — Evaluation (Signed)
Physical Therapy Evaluation Patient Details Name: Krista Blake MRN: [id number] DOB: 25-Mar-1966 Today's Date: 08/04/2021  History of Present Illness  Pt is a 55 y.o. female presenting to hospital 10/12 with acute onset of L knee pain s/p 2 separate I&D of L knee for underlying septic arthritis.  08/03/21 s/p arthroscopic I&D with bone biopsies x2 d/t acute septic arthritis with underlying DJD of L knee.  PMH includes htn; arthroscopic irrigation and debridement with extensive synovectomy, partial medial and lateral meniscectomies, and abrasion chondroplasty of grade III chondromalacia femoral trochlea, left knee Left 05/04/2021 (Dr. Roland Rack); and arthroscopic irrigation debridement with extensive debridement and abrasion chondroplasty of medial femoral condyle, lateral femoral condyle, and patella, left knee Left 06/07/2021 (Dr. Roland Rack).  Clinical Impression  Prior to hospital admission, pt was modified independent with functional mobility; currently living with her parents on main level of home with 2 STE (no railings).  Currently pt is SBA with transfers and CGA with ambulation 140 feet with RW;  pt able to maintain L LE NWB'ing status throughout sessions activities.  Pt would benefit from skilled PT to address noted impairments and functional limitations (see below for any additional details).  Upon hospital discharge, pt would benefit from OP PT.       Recommendations for follow up therapy are one component of a multi-disciplinary discharge planning process, led by the attending physician.  Recommendations may be updated based on patient status, additional functional criteria and insurance authorization.  Follow Up Recommendations Outpatient PT    Equipment Recommendations  Rolling walker with 5" wheels    Recommendations for Other Services       Precautions / Restrictions Precautions Precautions: Fall Precaution Comments: Hemovac Restrictions Weight Bearing Restrictions: Yes LLE  Weight Bearing: Non weight bearing Other Position/Activity Restrictions: L knee ROM per pt tolerance (limited prior to surgery)--per discussion with MD Poggi 08/04/21      Mobility  Bed Mobility               General bed mobility comments: Deferred (pt in recliner beginning/end of session)    Transfers Overall transfer level: Needs assistance Equipment used: Rolling walker (2 wheeled) Transfers: Sit to/from Stand Sit to Stand: Supervision         General transfer comment: initial vc's for overall technique and WB'ing status  Ambulation/Gait Ambulation/Gait assistance: Min guard Gait Distance (Feet): 140 Feet Assistive device: Rolling walker (2 wheeled)   Gait velocity: mildly decreased   General Gait Details: able to maintain L LE NWB'ing status; steady with RW use; initial vc's for technique/WB'ing status  Stairs            Wheelchair Mobility    Modified Rankin (Stroke Patients Only)       Balance Overall balance assessment: Needs assistance Sitting-balance support: No upper extremity supported;Feet supported Sitting balance-Leahy Scale: Normal Sitting balance - Comments: steady sitting reaching outside BOS   Standing balance support: Single extremity supported Standing balance-Leahy Scale: Fair Standing balance comment: steady static standing with single UE support                             Pertinent Vitals/Pain Pain Assessment: 0-10 Pain Score: 2  Pain Location: L knee Pain Descriptors / Indicators: Sore Pain Intervention(s): Limited activity within patient's tolerance;Monitored during session;Repositioned;Premedicated before session Vitals (HR and O2 on room air) stable and WFL throughout treatment session.    Home Living Family/patient expects to be discharged to:: Private  residence Living Arrangements: Parent (Parent's home) Available Help at Discharge: Family;Available 24 hours/day Type of Home: House Home Access: Stairs  to enter Entrance Stairs-Rails: None Entrance Stairs-Number of Steps: 2 Home Layout: Two level;Able to live on main level with bedroom/bathroom Home Equipment: Walker - 4 wheels;Cane - single point;Crutches;Walker - 2 wheels;Bedside commode      Prior Function Level of Independence: Independent with assistive device(s)         Comments: Pt was progressing from crutches to cane but requiring crutches recently d/t increased L knee pain     Hand Dominance        Extremity/Trunk Assessment   Upper Extremity Assessment Upper Extremity Assessment: Overall WFL for tasks assessed    Lower Extremity Assessment Lower Extremity Assessment: LLE deficits/detail (R LE WFL) LLE Deficits / Details: L knee extension grossly 10 degrees short of neutral; deferred L knee AROM flexion testing during session d/t pending response from MD Poggi (received message after session ROM per pt tolerance)    Cervical / Trunk Assessment Cervical / Trunk Assessment: Normal  Communication   Communication: No difficulties  Cognition Arousal/Alertness: Awake/alert Behavior During Therapy: WFL for tasks assessed/performed Overall Cognitive Status: Within Functional Limits for tasks assessed                                        General Comments General comments (skin integrity, edema, etc.): L knee hemovac in place.  Nursing cleared pt for participation in physical therapy.  Pt agreeable to PT session.    Exercises  Transfer and gait training   Assessment/Plan    PT Assessment Patient needs continued PT services  PT Problem List Decreased strength;Decreased range of motion;Decreased activity tolerance;Decreased balance;Decreased mobility;Decreased knowledge of use of DME;Decreased knowledge of precautions;Pain;Decreased skin integrity       PT Treatment Interventions DME instruction;Gait training;Stair training;Functional mobility training;Therapeutic activities;Therapeutic  exercise;Balance training;Patient/family education    PT Goals (Current goals can be found in the Care Plan section)  Acute Rehab PT Goals Patient Stated Goal: to go home PT Goal Formulation: With patient Time For Goal Achievement: 08/18/21 Potential to Achieve Goals: Good    Frequency 7X/week   Barriers to discharge        Co-evaluation               AM-PAC PT "6 Clicks" Mobility  Outcome Measure Help needed turning from your back to your side while in a flat bed without using bedrails?: None Help needed moving from lying on your back to sitting on the side of a flat bed without using bedrails?: None Help needed moving to and from a bed to a chair (including a wheelchair)?: A Little Help needed standing up from a chair using your arms (e.g., wheelchair or bedside chair)?: A Little Help needed to walk in hospital room?: A Little Help needed climbing 3-5 steps with a railing? : A Little 6 Click Score: 20    End of Session Equipment Utilized During Treatment: Gait belt Activity Tolerance: Patient tolerated treatment well Patient left: in chair;with call bell/phone within reach;with chair alarm set;with SCD's reapplied;Other (comment) (L heel floating via towel roll) Nurse Communication: Mobility status;Precautions;Weight bearing status PT Visit Diagnosis: Other abnormalities of gait and mobility (R26.89);Muscle weakness (generalized) (M62.81);Pain Pain - Right/Left: Left Pain - part of body: Knee    Time: 5093-2671 PT Time Calculation (min) (ACUTE ONLY): 37 min  Charges:   PT Evaluation $PT Eval Low Complexity: 1 Low PT Treatments $Gait Training: 8-22 mins $Therapeutic Activity: 8-22 mins       Leitha Bleak, PT 08/04/21, 10:19 AM

## 2021-08-04 NOTE — Progress Notes (Signed)
Peripherally Inserted Central Catheter Placement  The IV Nurse has discussed with the patient and/or persons authorized to consent for the patient, the purpose of this procedure and the potential benefits and risks involved with this procedure.  The benefits include less needle sticks, lab draws from the catheter, and the patient may be discharged home with the catheter. Risks include, but not limited to, infection, bleeding, blood clot (thrombus formation), and puncture of an artery; nerve damage and irregular heartbeat and possibility to perform a PICC exchange if needed/ordered by physician.  Alternatives to this procedure were also discussed.  Bard Power PICC patient education guide, fact sheet on infection prevention and patient information card has been provided to patient /or left at bedside.    PICC Placement Documentation  PICC Single Lumen 23/36/12 Left Basilic 37 cm 0 cm (Active)       Rolena Infante 08/04/2021, 7:08 PM

## 2021-08-04 NOTE — Anesthesia Postprocedure Evaluation (Signed)
Anesthesia Post Note  Patient: Gunhild Bautch  Procedure(s) Performed: LEFT KNEE REPEAT IRRIGATION AND DEBRIDEMENT WITH BONE CULTURES. ARHTROSCOPIC (Left: Knee)  Patient location during evaluation: PACU Anesthesia Type: General Level of consciousness: awake and alert Pain management: pain level controlled Vital Signs Assessment: post-procedure vital signs reviewed and stable Respiratory status: spontaneous breathing, nonlabored ventilation, respiratory function stable and patient connected to nasal cannula oxygen Cardiovascular status: blood pressure returned to baseline and stable Postop Assessment: no apparent nausea or vomiting Anesthetic complications: no   No notable events documented.   Last Vitals:  Vitals:   08/04/21 0739 08/04/21 1130  BP: 112/75 (!) 112/58  Pulse: 76 72  Resp: 15 14  Temp: 36.6 C 36.8 C  SpO2: 98% 98%    Last Pain:  Vitals:   08/04/21 0739  TempSrc:   PainSc: 2                  Molli Barrows

## 2021-08-04 NOTE — Progress Notes (Signed)
Subjective: 1 Day Post-Op Procedure(s) (LRB): LEFT KNEE REPEAT IRRIGATION AND DEBRIDEMENT WITH BONE CULTURES. ARHTROSCOPIC (Left) Patient reports pain as mild.   Patient is well, and has had no acute complaints or problems Plan is to go Home after hospital stay. Negative for chest pain and shortness of breath Fever: no Gastrointestinal:Negative for nausea and vomiting  Objective: Vital signs in last 24 hours: Temp:  [97.2 F (36.2 C)-98.8 F (37.1 C)] 98.3 F (36.8 C) (10/13 1130) Pulse Rate:  [71-97] 72 (10/13 1130) Resp:  [9-22] 14 (10/13 1130) BP: (108-154)/(58-89) 112/58 (10/13 1130) SpO2:  [94 %-99 %] 98 % (10/13 1130) Weight:  [103 kg] 103 kg (10/12 1800)  Intake/Output from previous day:  Intake/Output Summary (Last 24 hours) at 08/04/2021 1419 Last data filed at 08/04/2021 0404 Gross per 24 hour  Intake 990 ml  Output 85 ml  Net 905 ml    Intake/Output this shift: No intake/output data recorded.  Labs: Recent Labs    08/04/21 0533  HGB 11.1*   Recent Labs    08/04/21 0533  WBC 7.3  RBC 3.82*  HCT 32.3*  PLT 248   Recent Labs    08/03/21 1756 08/04/21 0533  NA 130* 133*  K 3.7 3.9  CL 92* 97*  CO2 28 30  BUN 15 13  CREATININE 0.71 0.64  GLUCOSE 192* 169*  CALCIUM 9.2 9.0   No results for input(s): LABPT, INR in the last 72 hours.   EXAM General - Patient is Alert, Appropriate, and Oriented Extremity - ABD soft Neurovascular intact Dorsiflexion/Plantar flexion intact Bulky dressing intact to the left knee with hemovac in place. Negative homans to the left leg. Dressing/Incision - Moderate bloody drainage to the hemovac at this time. Motor Function - intact, moving foot and toes well on exam.  Abdomen soft with normal bowel sounds.  Past Medical History:  Diagnosis Date   Bulging lumbar disc    History of mammogram 03/04/2015   BIRAD 1   History of Papanicolaou smear of cervix 02/09/2016   NIL/NEG   Hypertension    Pap smear  abnormality of cervix/human papillomavirus (HPV) positive 02/05/2015   NIL Pap with positive HPV aptima.  HPV 16,18.45 neg   Screening for colon cancer 07/2018   neg Cologuard; repeat in 3 yrs    Assessment/Plan: 1 Day Post-Op Procedure(s) (LRB): LEFT KNEE REPEAT IRRIGATION AND DEBRIDEMENT WITH BONE CULTURES. ARHTROSCOPIC (Left) Active Problems:   Septic arthritis of knee, left (HCC)  Estimated body mass index is 38.98 kg/m as calculated from the following:   Height as of 07/19/21: 5\' 4"  (1.626 m).   Weight as of this encounter: 103 kg. Advance diet Up with therapy  Labs reviewed this AM. WBC 7.3. Cultures pending from surgery at this time.  Currently on IV Vancomycin. Infectious disease consulted.  PICC line to be placed. Up with therapy today. Plan for hemovac removal tomorrow afternoon. Will likely undergo repeat washout early next week.  DVT Prophylaxis - Lovenox and Foot Pumps Non-weightbearing to the left leg at this time.  Raquel Rosi Secrist, PA-C [facility] 08/04/2021, 2:19 PM

## 2021-08-04 NOTE — Consult Note (Signed)
Infectious Disease     Reason for Consult:septic joing    Referring Physician: Dr Roland Rack Date of Admission:  08/03/2021   Active Problems:   Septic arthritis of knee, left (HCC)   HPI: Krista Blake is a 55 y.o. female with a hx of recent L MSSA septic knee treated with I and D on 7/13 initially then again on 8/16. After first surgery treated with bactrim. Of note also had gouty cyrstals noted on initial tap. After second surgery received 4 weeks IV cefazolin then 2-4 weeks oral keflex. Her inflammatory makers had improved and she stopped the abx after ID fu. She states she was doing well and walking and working with PT but then a few days ago she had recurrent pain and was seen by ortho. Tap showed 213 K WBC. MRI showed the effusion plus multiple erosis in femur and prox tib consistent with osteomyelitis.  Taken to Or 10/12 - for I an D and bone bxp   Past Medical History:  Diagnosis Date   Bulging lumbar disc    History of mammogram 03/04/2015   BIRAD 1   History of Papanicolaou smear of cervix 02/09/2016   NIL/NEG   Hypertension    Pap smear abnormality of cervix/human papillomavirus (HPV) positive 02/05/2015   NIL Pap with positive HPV aptima.  HPV 16,18.45 neg   Screening for colon cancer 07/2018   neg Cologuard; repeat in 3 yrs   Past Surgical History:  Procedure Laterality Date   I & D EXTREMITY Left 05/04/2021   Procedure: KNEE ARTHROSCOPY WITH IRRIGATION AND DEBRIDEMENT;  Surgeon: Corky Mull, MD;  Location: [facility] ORS;  Service: Orthopedics;  Laterality: Left;   KNEE ARTHROSCOPY Left 06/07/2021   Procedure: ARTHROSCOPIC IRRIGATION AND DEBRIDEMENT;  Surgeon: Corky Mull, MD;  Location: [facility] ORS;  Service: Orthopedics;  Laterality: Left;   Social History   Tobacco Use   Smoking status: Never   Smokeless tobacco: Never  Vaping Use   Vaping Use: Never used  Substance Use Topics   Alcohol use: Yes    Alcohol/week: 0.0 standard drinks    Comment: social   Drug  use: No   Family History  Problem Relation Age of Onset   Hypertension Father    Diabetes Paternal Uncle    Diabetes Paternal Grandfather    Ovarian cancer Neg Hx    Breast cancer Neg Hx    Colon cancer Neg Hx     Allergies:  Allergies  Allergen Reactions   Penicillins Hives    Reaction : Hives during college TOLERATED CEFAZOLIN AND AMOXICILLIN    Current antibiotics: Antibiotics Given (last 72 hours)     Date/Time Action Medication Dose Rate   08/03/21 1529 Given  [MIXED WITH 0.9% NS 10 MLS]   vancomycin (VANCOCIN) powder 1,000 mg    08/03/21 1853 New Bag/Given   vancomycin (VANCOCIN) IVPB 1000 mg/200 mL premix 1,000 mg 200 mL/hr       MEDICATIONS:  acetaminophen  1,000 mg Oral Q6H   allopurinol  100 mg Oral Daily   docusate sodium  100 mg Oral BID   enoxaparin (LOVENOX) injection  40 mg Subcutaneous Q24H   hydrochlorothiazide  25 mg Oral Daily   ketorolac  7.5 mg Intravenous Q6H   magnesium oxide  200 mg Oral Daily   PARoxetine  20 mg Oral Daily   propranolol  20 mg Oral Daily   rosuvastatin  10 mg Oral Daily    Review of Systems - 11  systems reviewed and negative per HPI   OBJECTIVE: Temp:  [97.2 F (36.2 C)-98.8 F (37.1 C)] 97.9 F (36.6 C) (10/13 0739) Pulse Rate:  [71-97] 76 (10/13 0739) Resp:  [9-22] 15 (10/13 0739) BP: (108-154)/(64-89) 112/75 (10/13 0739) SpO2:  [94 %-99 %] 98 % (10/13 0739) Weight:  [103 kg] 103 kg (10/12 1800) Physical Exam  Constitutional:  oriented to person, place, and time. appears well-developed and well-nourished. No distress.  HENT: [place]/AT, PERRLA, no scleral icterus Mouth/Throat: Oropharynx is clear and moist. No oropharyngeal exudate.  Cardiovascular: Normal rate, regular rhythm and normal heart sounds. Exam reveals no gallop and no friction rub.  No murmur heard.  Pulmonary/Chest: Effort normal and breath sounds normal. No respiratory distress.  has no wheezes.  Neck = supple, no nuchal rigidity Abdominal: Soft.  Bowel sounds are normal.  exhibits no distension. There is no tenderness.  Ext L knee wrapped post op Lymphadenopathy: no cervical adenopathy. No axillary adenopathy Neurological: alert and oriented to person, place, and time.  Skin: Skin is warm and dry. No rash noted. No erythema.  Psychiatric: a normal mood and affect.  behavior is normal.    LABS: Results for orders placed or performed during the hospital encounter of 08/03/21 (from the past 48 hour(s))  Aerobic/Anaerobic Culture w Gram Stain (surgical/deep wound)     Status: None (Preliminary result)   Collection Time: 08/03/21  2:31 PM   Specimen: Wound; Body Fluid  Result Value Ref Range   Specimen Description      WOUND Performed at [facility], [address] [place], [place] [address]    Special Requests      left knee body fluid Performed at [facility], [place], [place] [address]    Gram Stain      MODERATE WBC PRESENT,BOTH PMN AND MONONUCLEAR NO ORGANISMS SEEN Performed at [facility], [place] [address] [place], [place] [address]    Culture PENDING    Report Status PENDING   Aerobic/Anaerobic Culture w Gram Stain (surgical/deep wound)     Status: None (Preliminary result)   Collection Time: 08/03/21  3:03 PM   Specimen: Wound; Tissue  Result Value Ref Range   Specimen Description      WOUND Performed at [facility], [address] [place], [place] [address]    Special Requests      LEFT KNEE BONE BIOPSY LATERAL FEMORAL Performed at [facility], [place], [place] [address]    Gram Stain      NO WBC SEEN NO ORGANISMS SEEN Performed at [facility], [place] [address], [place], [place] [address]    Culture PENDING    Report Status PENDING   Aerobic/Anaerobic Culture w Gram Stain (surgical/deep wound)     Status: None (Preliminary result)   Collection Time: 08/03/21  3:20 PM   Specimen: Wound; Tissue  Result Value Ref Range    Specimen Description      WOUND Performed at [facility], [address] [place], [place] [address]    Special Requests      left knee biopsy medial Performed at [facility], [place], [place] [address]    Gram Stain      NO WBC SEEN NO ORGANISMS SEEN Performed at [facility], [place] [address] [place], [place] [address]    Culture PENDING    Report Status PENDING   Basic metabolic panel  Status: Abnormal   Collection Time: 08/03/21  5:56 PM  Result Value Ref Range   Sodium 130 (L) 135 - 145 mmol/L   Potassium 3.7 3.5 - 5.1 mmol/L   Chloride 92 (L) 98 - 111 mmol/L   CO2 28 22 - 32 mmol/L   Glucose, Bld 192 (H) 70 - 99 mg/dL    Comment: Glucose reference range applies only to samples taken after fasting for at least 8 hours.   BUN 15 6 - 20 mg/dL   Creatinine, Ser 0.71 0.44 - 1.00 mg/dL   Calcium 9.2 8.9 - 10.3 mg/dL   GFR, Estimated >60 >60 mL/min    Comment: (NOTE) Calculated using the CKD-EPI Creatinine Equation (2021)    Anion gap 10 5 - 15    Comment: Performed at [facility], [address] [place], [place] [address]  Basic metabolic panel     Status: Abnormal   Collection Time: 08/04/21  5:33 AM  Result Value Ref Range   Sodium 133 (L) 135 - 145 mmol/L   Potassium 3.9 3.5 - 5.1 mmol/L   Chloride 97 (L) 98 - 111 mmol/L   CO2 30 22 - 32 mmol/L   Glucose, Bld 169 (H) 70 - 99 mg/dL    Comment: Glucose reference range applies only to samples taken after fasting for at least 8 hours.   BUN 13 6 - 20 mg/dL   Creatinine, Ser 0.64 0.44 - 1.00 mg/dL   Calcium 9.0 8.9 - 10.3 mg/dL   GFR, Estimated >60 >60 mL/min    Comment: (NOTE) Calculated using the CKD-EPI Creatinine Equation (2021)    Anion gap 6 5 - 15    Comment: Performed at [facility], [address] [place], [place] [address]  CBC     Status: Abnormal   Collection Time: 08/04/21  5:33 AM  Result Value Ref Range   WBC 7.3 4.0 - 10.5 K/uL    RBC 3.82 (L) 3.87 - 5.11 MIL/uL   Hemoglobin 11.1 (L) 12.0 - 15.0 g/dL   HCT 32.3 (L) 36.0 - 46.0 %   MCV 84.6 80.0 - 100.0 fL   MCH 29.1 26.0 - 34.0 pg   MCHC 34.4 30.0 - 36.0 g/dL   RDW 12.8 11.5 - 15.5 %   Platelets 248 150 - 400 K/uL   nRBC 0.0 0.0 - 0.2 %    Comment: Performed at [facility], [address] [place], [place] [address]   No components found for: ESR, C REACTIVE PROTEIN MICRO: Recent Results (from the past 720 hour(s))  Aerobic/Anaerobic Culture w Gram Stain (surgical/deep wound)     Status: None (Preliminary result)   Collection Time: 08/03/21  2:31 PM   Specimen: Wound; Body Fluid  Result Value Ref Range Status   Specimen Description   Final    WOUND Performed at [facility], [address] [place], [place] [address]    Special Requests   Final    left knee body fluid Performed at [facility], [place], [place] [address]    Gram Stain   Final    MODERATE WBC PRESENT,BOTH PMN AND MONONUCLEAR NO ORGANISMS SEEN Performed at [facility], [place] [address] [place],  [address]    Culture PENDING  Incomplete   Report Status PENDING  Incomplete  Aerobic/Anaerobic Culture w Gram Stain (surgical/deep wound)     Status: None (Preliminary result)   Collection Time: 08/03/21  3:03 PM  Specimen: Wound; Tissue  Result Value Ref Range Status   Specimen Description   Final    WOUND Performed at [facility], [address], [place], [place] [address]    Special Requests   Final    LEFT KNEE BONE BIOPSY LATERAL FEMORAL Performed at [facility], [address] [place], [place] [address]    Gram Stain   Final    NO WBC SEEN NO ORGANISMS SEEN Performed at [facility], [place] [address], [place], [place] [address]    Culture PENDING  Incomplete   Report Status PENDING  Incomplete  Aerobic/Anaerobic Culture w Gram Stain (surgical/deep wound)     Status: None (Preliminary  result)   Collection Time: 08/03/21  3:20 PM   Specimen: Wound; Tissue  Result Value Ref Range Status   Specimen Description   Final    WOUND Performed at [facility], [address] [place], [place] [address]    Special Requests   Final    left knee biopsy medial Performed at [facility], [address] [place], [place] [address]    Gram Stain   Final    NO WBC SEEN NO ORGANISMS SEEN Performed at [facility], [place] [address], [place], [place] [address]    Culture PENDING  Incomplete   Report Status PENDING  Incomplete    IMAGING: MR KNEE LEFT WO CONTRAST  Result Date: 08/02/2021 CLINICAL DATA:  Staphylococcal arthritis of left knee. Left knee irrigation and debridement 05/04/2021 and 06/07/2021. EXAM: MRI OF THE LEFT KNEE WITHOUT CONTRAST TECHNIQUE: Multiplanar, multisequence MR imaging of the knee was performed. No intravenous contrast was administered. COMPARISON:  None. FINDINGS: MENISCI Medial meniscus: The medial meniscus is largely extruded peripherally from the joint, and the meniscal body is diffusely diminutive. These findings may be secondary to prior subtotal meniscectomy. No centrally displaced meniscal fragment identified. Lateral meniscus: Diminutive anterior horn which may be secondary to previous partial meniscectomy. The meniscal body is partially extruded peripherally from the joint. No centrally displaced meniscal fragment. LIGAMENTS Cruciates:  Intact. Collaterals:  Intact. CARTILAGE Patellofemoral: Mild-to-moderate patellofemoral chondral thinning and surface irregularity, most notably in the medial trochlea. No significant subchondral edema or cortical destruction at the patellofemoral joint. Medial: Advanced chondral thinning, surface irregularity and diffuse subchondral edema. There are peripheral erosive changes within the medial femoral condyle and medial tibial plateau, suspicious for osteomyelitis. Lateral: Mild chondral thinning and  surface irregularity with diffuse subchondral edema. There is a large erosion involving the lateral femoral condyle posteriorly. MISCELLANEOUS Joint:  Moderate size complex joint effusion. Popliteal Fossa: Complex fluid in the popliteus hiatus. The popliteus tendon is intact. No significant Baker's cyst. Extensor Mechanism:  Intact. Bones: As above, extensive subchondral edema with multiple erosions of the distal femur and proximal tibia, suspicious for septic arthritis and osteomyelitis. No evidence of acute fracture. Other: Mild generalized periarticular soft tissue edema without other focal fluid collection. IMPRESSION: 1. No relevant comparison studies available. 2. Moderate size complex knee joint effusion with diffuse subchondral edema and multiple erosions involving the distal femur and proximal tibia, consistent with known septic arthritis and osteomyelitis. 3. Suspected postsurgical changes involving both menisci as described above. Correlate with prior imaging and surgical procedures. 4. The knee ligaments appear intact. Electronically Signed   By: Richardean Sale M.D.   On: 08/02/2021 13:10    Assessment:   Krista Blake is a 55 y.o. female with  recurrent MSSA septic arthritis s/p I and  D x 3 now. Also had gout noted in July on aspiration and was treated with intraart steroids and no abx as initial MSSA cx was felt contaminant. She preveiously received 4 weeks IV abx and 2 weeks oral with a good response clinically and in inflammatory markers but now with recurrence and MRI findings of osteomyelitis of femur and tibia.  She is s/p washout 10/12- cx with staph again. Planned repeat wash out next week  Recommendations Will need PICC line and repeat course of IV abx. Will plan on very prolonged oral course of abx after a min 6-8 weeks of IV abx Place PICC Thank you very much for allowing me to participate in the care of this patient. Please call with questions.   Cheral Marker. Ola Spurr,  MD

## 2021-08-05 LAB — BASIC METABOLIC PANEL
Anion gap: 7 (ref 5–15)
BUN: 14 mg/dL (ref 6–20)
CO2: 29 mmol/L (ref 22–32)
Calcium: 8.4 mg/dL — ABNORMAL LOW (ref 8.9–10.3)
Chloride: 102 mmol/L (ref 98–111)
Creatinine, Ser: 0.64 mg/dL (ref 0.44–1.00)
GFR, Estimated: >60 mL/min (ref >60)
Glucose, Bld: 122 mg/dL — ABNORMAL HIGH (ref 70–99)
Potassium: 3.8 mmol/L (ref 3.5–5.1)
Sodium: 138 mmol/L (ref 135–145)

## 2021-08-05 LAB — CBC
HCT: 30.9 % — ABNORMAL LOW (ref 36.0–46.0)
Hemoglobin: 10.2 g/dL — ABNORMAL LOW (ref 12.0–15.0)
MCH: 29.1 pg (ref 26.0–34.0)
MCHC: 33 g/dL (ref 30.0–36.0)
MCV: 88 fL (ref 80.0–100.0)
Platelets: 237 10*3/uL (ref 150–400)
RBC: 3.51 MIL/uL — ABNORMAL LOW (ref 3.87–5.11)
RDW: 13.1 % (ref 11.5–15.5)
WBC: 6.2 10*3/uL (ref 4.0–10.5)
nRBC: 0 % (ref 0.0–0.2)

## 2021-08-05 LAB — C-REACTIVE PROTEIN: CRP: 4 mg/dL — ABNORMAL HIGH (ref <1.0)

## 2021-08-05 LAB — SEDIMENTATION RATE: Sed Rate: 65 mm/hr — ABNORMAL HIGH (ref 0–30)

## 2021-08-05 MED ORDER — CEFAZOLIN SODIUM-DEXTROSE 2-4 GM/100ML-% IV SOLN
2.0000 g | Freq: Three times a day (TID) | INTRAVENOUS | Status: DC
  Administered 2021-08-05 – 2021-08-10 (×17): 2 g via INTRAVENOUS
  Filled 2021-08-05 (×14): qty 100

## 2021-08-05 NOTE — Progress Notes (Signed)
Physical Therapy Treatment Patient Details Name: Krista Blake MRN: [id number] DOB: 01/03/1966 Today's Date: 08/05/2021   History of Present Illness Pt is a 55 y.o. female presenting to hospital 10/12 with acute onset of L knee pain s/p 2 separate I&D of L knee for underlying septic arthritis.  08/03/21 s/p arthroscopic I&D with bone biopsies x2 d/t acute septic arthritis with underlying DJD of L knee.  PMH includes htn; arthroscopic irrigation and debridement with extensive synovectomy, partial medial and lateral meniscectomies, and abrasion chondroplasty of grade III chondromalacia femoral trochlea, left knee Left 05/04/2021 (Dr. Roland Rack); and arthroscopic irrigation debridement with extensive debridement and abrasion chondroplasty of medial femoral condyle, lateral femoral condyle, and patella, left knee Left 06/07/2021 (Dr. Roland Rack).    PT Comments    Pt resting in bed upon PT arrival; agreeable to PT session.  Pt with updated [place] orders today: 50% WB'ing to L leg.  Pt tolerated LE ex's in bed fairly well with assist for L LE as needed.  L knee flexion AROM to 65 degrees.  SBA semi-supine to sitting edge of bed (pt using B UE's to move L LE out of bed); CGA with transfers using RW; and CGA ambulating 160 feet with RW.  Pt requiring initial vc's and demo for new [place] status but did well maintaining WB'ing precautions during session.  Will continue to focus on strengthening, knee ROM, and progressive functional mobility during hospitalization.    Recommendations for follow up therapy are one component of a multi-disciplinary discharge planning process, led by the attending physician.  Recommendations may be updated based on patient status, additional functional criteria and insurance authorization.  Follow Up Recommendations  Outpatient PT     Equipment Recommendations  Rolling walker with 5" wheels    Recommendations for Other Services       Precautions / Restrictions  Precautions Precautions: Fall Precaution Comments: R PICC line Restrictions Weight Bearing Restrictions: Yes LLE Weight Bearing: Partial weight bearing LLE Partial Weight Bearing Percentage or Pounds: 50% WB'ing to L leg Other Position/Activity Restrictions: L knee ROM per pt tolerance (limited prior to surgery)--per discussion with MD Poggi 08/04/21     Mobility  Bed Mobility Overal bed mobility: Needs Assistance Bed Mobility: Supine to Sit     Supine to sit: Supervision;HOB elevated     General bed mobility comments: pt using B UE's to assist L LE out of bed; increased effort and time for pt to perform on own    Transfers Overall transfer level: Needs assistance Equipment used: Rolling walker (2 wheeled) Transfers: Sit to/from Stand Sit to Stand: Min guard         General transfer comment: initial vc's for overall technique and WB'ing status  Ambulation/Gait Ambulation/Gait assistance: Min guard Gait Distance (Feet): 160 Feet Assistive device: Rolling walker (2 wheeled)   Gait velocity: mildly decreased   General Gait Details: initial vc's/demo for gait technique, WB'ing status, and increasing UE support through RW to offweight L LE for PWB'ing status; able to maintain L LE PWB'ing status; steady with RW use   Stairs             Wheelchair Mobility    Modified Rankin (Stroke Patients Only)       Balance Overall balance assessment: Needs assistance Sitting-balance support: No upper extremity supported;Feet supported Sitting balance-Leahy Scale: Normal Sitting balance - Comments: steady sitting reaching outside BOS   Standing balance support: Single extremity supported Standing balance-Leahy Scale: Fair Standing balance comment: steady static standing  with single UE support                            Cognition Arousal/Alertness: Awake/alert Behavior During Therapy: WFL for tasks assessed/performed Overall Cognitive Status: Within  Functional Limits for tasks assessed                                        Exercises Total Joint Exercises Ankle Circles/Pumps: AROM;Strengthening;Both;10 reps;Supine Quad Sets: AROM;Strengthening;Both;10 reps;Supine Short Arc Quad: AAROM;Strengthening;Left;10 reps;Supine Heel Slides: AAROM;Strengthening;Left;10 reps;Supine Hip ABduction/ADduction: AAROM;Strengthening;Left;10 reps;Supine Straight Leg Raises: AAROM;Strengthening;Left;10 reps;Supine Goniometric ROM: L knee AROM 8-65 degrees    General Comments General comments (skin integrity, edema, etc.): L knee ace wrap in place.  Nursing cleared pt for participation in physical therapy.  Pt agreeable to PT session.      Pertinent Vitals/Pain Pain Assessment: 0-10 Pain Score: 2  (5-6/10 with activity; 2/10 at rest) Pain Location: L knee Pain Descriptors / Indicators: Sore Pain Intervention(s): Limited activity within patient's tolerance;Monitored during session;Premedicated before session;Repositioned Vitals (HR and O2 on room air) stable and WFL throughout treatment session.    Home Living                      Prior Function            PT Goals (current goals can now be found in the care plan section) Acute Rehab PT Goals Patient Stated Goal: to go home PT Goal Formulation: With patient Time For Goal Achievement: 08/18/21 Potential to Achieve Goals: Good Progress towards PT goals: Progressing toward goals    Frequency    7X/week      PT Plan Current plan remains appropriate    Co-evaluation              AM-PAC PT "6 Clicks" Mobility   Outcome Measure  Help needed turning from your back to your side while in a flat bed without using bedrails?: None Help needed moving from lying on your back to sitting on the side of a flat bed without using bedrails?: A Little Help needed moving to and from a bed to a chair (including a wheelchair)?: A Little Help needed standing up from a  chair using your arms (e.g., wheelchair or bedside chair)?: A Little Help needed to walk in hospital room?: A Little Help needed climbing 3-5 steps with a railing? : A Little 6 Click Score: 19    End of Session Equipment Utilized During Treatment: Gait belt Activity Tolerance: Patient tolerated treatment well Patient left: in chair;with call bell/phone within reach;with chair alarm set;with SCD's reapplied;Other (comment) (L heel floating via towel roll) Nurse Communication: Mobility status;Precautions;Weight bearing status PT Visit Diagnosis: Other abnormalities of gait and mobility (R26.89);Muscle weakness (generalized) (M62.81);Pain Pain - Right/Left: Left Pain - part of body: Knee     Time: 5885-0277 PT Time Calculation (min) (ACUTE ONLY): 53 min  Charges:  $Gait Training: 23-37 mins $Therapeutic Exercise: 8-22 mins $Therapeutic Activity: 8-22 mins                    Leitha Bleak, PT 08/05/21, 3:42 PM

## 2021-08-05 NOTE — TOC Progression Note (Signed)
Transition of Care [facility]) - Progression Note    Patient Details  Name: Krista Blake MRN: [id number] Date of Birth: 1966/03/29  Transition of Care [facility]) CM/SW [place], RN Phone Number: 08/05/2021, 9:50 AM  Clinical Narrative:   Patient will likely go to Outpatient Infusion to get ABX and Outpatient PT, they are anticipating doing another washout early next week, PICC line was placed, TOC to monitor and follow for needs         Expected Discharge Plan and Services                                                 Social Determinants of Health (SDOH) Interventions    Readmission Risk Interventions No flowsheet data found.

## 2021-08-05 NOTE — TOC Progression Note (Signed)
Transition of Care [facility]) - Progression Note    Patient Details  Name: Krista Blake MRN: [id number] Date of Birth: 1965-12-02  Transition [facility] Care [facility]) CM/SW [place], RN Phone Number: 08/05/2021, 3:24 PM  Clinical Narrative:   Met with the patient in the room at the bedside, she has all the DME needed at home, she has had home health in the past, and would like the same agency and if possible , [name] had the patient previously and they think they will be able to accept the patient back, requested a call next week to confirm.         Expected Discharge Plan and Services                                                 Social Determinants of Health (SDOH) Interventions    Readmission Risk Interventions No flowsheet data found.

## 2021-08-05 NOTE — Progress Notes (Addendum)
Subjective: 2 Days Post-Op Procedure(s) (LRB): LEFT KNEE REPEAT IRRIGATION AND DEBRIDEMENT WITH BONE CULTURES. ARHTROSCOPIC (Left) Patient reports pain as mild.  Mild bloody drainage to the hemovac, was changed once yesterday, 20 mL of bloody drainage this AM. Patient is well, and has had no acute complaints or problems Plan is to go Home after hospital stay. Negative for chest pain and shortness of breath Fever: no Gastrointestinal:Negative for nausea and vomiting  Objective: Vital signs in last 24 hours: Temp:  [98 F (36.7 C)-98.5 F (36.9 C)] 98.2 F (36.8 C) (10/14 1202) Pulse Rate:  [68-88] 68 (10/14 1202) Resp:  [15-18] 15 (10/14 1202) BP: (101-144)/(56-91) 101/64 (10/14 1202) SpO2:  [95 %-98 %] 97 % (10/14 1202) Weight:  [103 kg] 103 kg (10/14 0039)  Intake/Output from previous day:  Intake/Output Summary (Last 24 hours) at 08/05/2021 1228 Last data filed at 08/05/2021 1029 Gross per 24 hour  Intake 3166.17 ml  Output 20 ml  Net 3146.17 ml    Intake/Output this shift: Total I/O In: 360 [P.O.:360] Out: -   Labs: Recent Labs    08/04/21 0533 08/05/21 0545  HGB 11.1* 10.2*   Recent Labs    08/04/21 0533 08/05/21 0545  WBC 7.3 6.2  RBC 3.82* 3.51*  HCT 32.3* 30.9*  PLT 248 237   Recent Labs    08/04/21 0533 08/05/21 0545  NA 133* 138  K 3.9 3.8  CL 97* 102  CO2 30 29  BUN 13 14  CREATININE 0.64 0.64  GLUCOSE 169* 122*  CALCIUM 9.0 8.4*   No results for input(s): LABPT, INR in the last 72 hours.   EXAM General - Patient is Alert, Appropriate, and Oriented Extremity - ABD soft Neurovascular intact Dorsiflexion/Plantar flexion intact Bulky dressing intact to the left knee with hemovac in place. Negative homans to the left leg. Dressing/Incision - 20cc mL to the hemovac this AM. Motor Function - intact, moving foot and toes well on exam.  Abdomen soft with normal bowel sounds.  Past Medical History:  Diagnosis Date   Bulging lumbar disc     History of mammogram 03/04/2015   BIRAD 1   History of Papanicolaou smear of cervix 02/09/2016   NIL/NEG   Hypertension    Pap smear abnormality of cervix/human papillomavirus (HPV) positive 02/05/2015   NIL Pap with positive HPV aptima.  HPV 16,18.45 neg   Screening for colon cancer 07/2018   neg Cologuard; repeat in 3 yrs    Assessment/Plan: 2 Days Post-Op Procedure(s) (LRB): LEFT KNEE REPEAT IRRIGATION AND DEBRIDEMENT WITH BONE CULTURES. ARHTROSCOPIC (Left) Active Problems:   Septic arthritis of knee, left (HCC)  Estimated body mass index is 38.98 kg/m as calculated from the following:   Height as of this encounter: 5\' 4"  (1.626 m).   Weight as of this encounter: 103 kg. Advance diet Up with therapy  Labs reviewed this AM. WBC 6.2 this AM. Some cultures are still pending, one of the bone culture demonstrated rare staph aureus. Currently on IV vancomycin. Infectious disease consulted.  PICC line placed yesterday. Up with therapy today.  Gradually increase weightbearing as tolerated. Will return this afternoon to remove the Hemovac. Plan on repeat washout early next week, either Monday or Tuesday.  DVT Prophylaxis - Lovenox and Foot Pumps Non-weightbearing to the left leg at this time.  Addendum 12:26 pm 08/05/21 Hemovac without any additional drainage. Hemovac was removed without complications.  Distal tip of the drain intact. No erythema or purulent drainage noted.  Effusion  improving. Bulky dressing and ACE wrap were applied to the left knee. Will advance weightbearing to 50% to the left leg. Continue to monitor for cultures thru the weekend.  Krista Annahi Short, PA-C [facility] 08/05/2021, 12:28 PM

## 2021-08-05 NOTE — Progress Notes (Signed)
[facility] INFECTIOUS DISEASE PROGRESS NOTE Date of Admission:  08/03/2021     ID: Krista Blake is a 55 y.o. female with  Septic knee Active Problems:   Septic arthritis of knee, left (HCC)   Subjective: No fevers, picc placed.  ROS  Eleven systems are reviewed and negative except per hpi  Medications:  Antibiotics Given (last 72 hours)     Date/Time Action Medication Dose Rate   08/03/21 1529 Given  [MIXED WITH 0.9% NS 10 MLS]   vancomycin (VANCOCIN) powder 1,000 mg    08/03/21 1853 New Bag/Given   vancomycin (VANCOCIN) IVPB 1000 mg/200 mL premix 1,000 mg 200 mL/hr   08/04/21 1738 New Bag/Given   vancomycin (VANCOREADY) IVPB 750 mg/150 mL 750 mg 150 mL/hr   08/05/21 0530 New Bag/Given   vancomycin (VANCOREADY) IVPB 750 mg/150 mL 750 mg 150 mL/hr       allopurinol  100 mg Oral Daily   Chlorhexidine Gluconate Cloth  6 each Topical Daily   docusate sodium  100 mg Oral BID   enoxaparin (LOVENOX) injection  40 mg Subcutaneous Q24H   hydrochlorothiazide  25 mg Oral Daily   magnesium oxide  200 mg Oral Daily   melatonin  5 mg Oral QHS   PARoxetine  20 mg Oral Daily   propranolol  20 mg Oral Daily   rosuvastatin  10 mg Oral Daily   sodium chloride flush  10-40 mL Intracatheter Q12H    Objective: Vital signs in last 24 hours: Temp:  [98 F (36.7 C)-98.5 F (36.9 C)] 98.2 F (36.8 C) (10/14 1202) Pulse Rate:  [68-88] 68 (10/14 1202) Resp:  [15-18] 15 (10/14 1202) BP: (101-144)/(56-91) 101/64 (10/14 1202) SpO2:  [95 %-98 %] 97 % (10/14 1202) Weight:  [103 kg] 103 kg (10/14 0039) Constitutional:  oriented to person, place, and time. appears well-developed and well-nourished. No distress.  HENT: [place]/AT, PERRLA, no scleral icterus Mouth/Throat: Oropharynx is clear and moist. No oropharyngeal exudate.  Cardiovascular: Normal rate, regular rhythm and normal heart sounds. Exam reveals no gallop and no friction rub.  No murmur heard.  Pulmonary/Chest: Effort  normal and breath sounds normal. No respiratory distress.  has no wheezes.  Neck = supple, no nuchal rigidity Abdominal: Soft. Bowel sounds are normal.  exhibits no distension. There is no tenderness.  Ext L knee wrapped post op Lymphadenopathy: no cervical adenopathy. No axillary adenopathy Neurological: alert and oriented to person, place, and time.  Skin: Skin is warm and dry. She has lots of small skin scabs, none infected apperaing Psychiatric: a normal mood and affect.  behavior is normal.    Lab Results Recent Labs    08/04/21 0533 08/05/21 0545  WBC 7.3 6.2  HGB 11.1* 10.2*  HCT 32.3* 30.9*  NA 133* 138  K 3.9 3.8  CL 97* 102  CO2 30 29  BUN 13 14  CREATININE 0.64 0.64    Microbiology: Results for orders placed or performed during the hospital encounter of 08/03/21  Aerobic/Anaerobic Culture w Gram Stain (surgical/deep wound)     Status: None (Preliminary result)   Collection Time: 08/03/21  2:31 PM   Specimen: Wound; Body Fluid  Result Value Ref Range Status   Specimen Description   Final    WOUND Performed at [facility], [address] [place], [place] [address]    Special Requests   Final    left knee body fluid Performed at [facility], [address] [place], [place] [address]  Gram Stain   Final    MODERATE WBC PRESENT,BOTH PMN AND MONONUCLEAR NO ORGANISMS SEEN    Culture   Final    NO GROWTH 2 DAYS Performed at [facility] [address] [place], [place] [address]    Report Status PENDING  Incomplete  Aerobic/Anaerobic Culture w Gram Stain (surgical/deep wound)     Status: None (Preliminary result)   Collection Time: 08/03/21  3:03 PM   Specimen: Wound; Tissue  Result Value Ref Range Status   Specimen Description   Final    WOUND Performed at [facility], [address] [place], [place] [address]    Special Requests   Final    LEFT KNEE BONE BIOPSY LATERAL FEMORAL Performed at [facility], [place], [place] [address]    Gram Stain NO WBC SEEN NO ORGANISMS SEEN   Final   Culture   Final    NO GROWTH 2 DAYS Performed at [facility], [place] [address] [place], [place] [address]    Report Status PENDING  Incomplete  Aerobic/Anaerobic Culture w Gram Stain (surgical/deep wound)     Status: None (Preliminary result)   Collection Time: 08/03/21  3:20 PM   Specimen: Wound; Tissue  Result Value Ref Range Status   Specimen Description   Final    WOUND Performed at [facility], [address] [place], [place] [address]    Special Requests   Final    left knee biopsy medial Performed at [facility], [place], [place] [address]    Gram Stain NO WBC SEEN NO ORGANISMS SEEN   Final   Culture   Final    RARE STAPHYLOCOCCUS AUREUS SUSCEPTIBILITIES TO FOLLOW Performed at [facility], [place] [address], [place], [place] [address]    Report Status PENDING  Incomplete    Studies/Results: [place] EKG SITE RITE  Result Date: 08/04/2021 If Site Rite image not attached, placement could not be confirmed due to current cardiac rhythm.   Assessment/Plan: Krista Blake is a 55 y.o. female with  recurrent MSSA septic arthritis s/p I and D x 3 now. She has neurodermatitis and lots of skin scabs a likely source of infection through hematogenous spread. Also had gout noted in July on aspiration and was treated with intraart steroids and no abx as initial MSSA cx was felt contaminant. She preveiously received 4 weeks IV abx and 2 weeks oral with a good response clinically and in inflammatory markers but now with recurrence and MRI findings of osteomyelitis of femur and tibia.  She is s/p washout 10/12- cx with staph again. Planned repeat wash out next week CRP 4, ESR 60 10/14 doing well.  Picc in place.    Recommendations Cont vanco but add cefazolin pending cultures Will plan on very prolonged oral course of abx after a  min 6-8 weeks of IV abx If MSSA on cx can dc vanco and continue  just cefazolin  Thank you very much for the consult. Will follow with you.  Leonel Ramsay   08/05/2021, 1:41 PM

## 2021-08-06 LAB — BASIC METABOLIC PANEL
Anion gap: 7 (ref 5–15)
BUN: 15 mg/dL (ref 6–20)
CO2: 32 mmol/L (ref 22–32)
Calcium: 9.3 mg/dL (ref 8.9–10.3)
Chloride: 102 mmol/L (ref 98–111)
Creatinine, Ser: 0.65 mg/dL (ref 0.44–1.00)
GFR, Estimated: >60 mL/min (ref >60)
Glucose, Bld: 110 mg/dL — ABNORMAL HIGH (ref 70–99)
Potassium: 3.8 mmol/L (ref 3.5–5.1)
Sodium: 141 mmol/L (ref 135–145)

## 2021-08-06 NOTE — Progress Notes (Signed)
  Subjective: 3 Days Post-Op Procedure(s) (LRB): LEFT KNEE REPEAT IRRIGATION AND DEBRIDEMENT WITH BONE CULTURES. ARHTROSCOPIC (Left) Patient reports pain as 2 on 0-10 scale when at rest, but more severe with ambulation and movement. Patient is well, and has had no acute complaints or problems Plan is for repeat I&D early next week. Negative for chest pain and shortness of breath Fever: no   Objective: Vital signs in last 24 hours: Temp:  [98.1 F (36.7 C)-98.6 F (37 C)] 98.1 F (36.7 C) (10/15 0720) Pulse Rate:  [63-82] 63 (10/15 0720) Resp:  [15-18] 17 (10/15 0720) BP: (101-127)/(64-86) 116/86 (10/15 0720) SpO2:  [97 %-100 %] 100 % (10/15 0720)  Intake/Output from previous day:  Intake/Output Summary (Last 24 hours) at 08/06/2021 1030 Last data filed at 08/06/2021 1019 Gross per 24 hour  Intake 3547.48 ml  Output --  Net 3547.48 ml    Intake/Output this shift: Total I/O In: 2349.6 [P.O.:840; I.V.:913.2; IV Piggyback:596.4] Out: -   Labs: Recent Labs    08/04/21 0533 08/05/21 0545  HGB 11.1* 10.2*   Recent Labs    08/04/21 0533 08/05/21 0545  WBC 7.3 6.2  RBC 3.82* 3.51*  HCT 32.3* 30.9*  PLT 248 237   Recent Labs    08/05/21 0545 08/06/21 0445  NA 138 141  K 3.8 3.8  CL 102 102  CO2 29 32  BUN 14 15  CREATININE 0.64 0.65  GLUCOSE 122* 110*  CALCIUM 8.4* 9.3   No results for input(s): LABPT, INR in the last 72 hours.   EXAM General - Patient is Alert, Appropriate, and Oriented Extremity - Neurovascular intact Dorsiflexion/Plantar flexion intact Compartment soft Dressing/Incision -Post-op dressing intact, no drainage noted through ACE wrap or when examining Kerlix underneath.  Motor Function - intact, moving foot and toes well on exam.      Assessment/Plan: 3 Days Post-Op Procedure(s) (LRB): LEFT KNEE REPEAT IRRIGATION AND DEBRIDEMENT WITH BONE CULTURES. ARHTROSCOPIC (Left) Active Problems:   Septic arthritis of knee, left  (HCC)  Estimated body mass index is 38.98 kg/m as calculated from the following:   Height as of this encounter: 5\' 4"  (1.626 m).   Weight as of this encounter: 103 kg. Up with therapy, partial WB to LLE.  PICC line has been placed. Continue IV abx at this time.   Plan for repeat I&D next week with Dr Roland Rack.  Per I&D, likely plan for 6-8 weeks of IV abx prior to prolonged course of oral abx.   DVT Prophylaxis - Lovenox and foot pumps 50% Weight-Bearing to left leg  Cassell Smiles, PA-C [facility]opa[facility] 08/06/2021, 10:30 AM

## 2021-08-06 NOTE — Plan of Care (Signed)

## 2021-08-06 NOTE — Progress Notes (Signed)
Patient resting in bed. IV infusing. Dressing on left lower extremity clean, dry, and intact. Patient uses walker while up and ambulating. Room air. No complaints at this time. Bed low, call bell in reach. Educated patient on to call for assistance when needed.

## 2021-08-06 NOTE — Progress Notes (Signed)
Physical Therapy Treatment Patient Details Name: Krista Blake MRN: [id number] DOB: 04-08-1966 Today's Date: 08/06/2021   History of Present Illness Pt is a 55 y.o. female presenting to hospital 10/12 with acute onset of L knee pain s/p 2 separate I&D of L knee for underlying septic arthritis.  08/03/21 s/p arthroscopic I&D with bone biopsies x2 d/t acute septic arthritis with underlying DJD of L knee.  PMH includes htn; arthroscopic irrigation and debridement with extensive synovectomy, partial medial and lateral meniscectomies, and abrasion chondroplasty of grade III chondromalacia femoral trochlea, left knee Left 05/04/2021 (Dr. Roland Rack); and arthroscopic irrigation debridement with extensive debridement and abrasion chondroplasty of medial femoral condyle, lateral femoral condyle, and patella, left knee Left 06/07/2021 (Dr. Roland Rack).    PT Comments    Pt is A and O x 4 and extremely pleasant. Has been through knee surgery's in past and has great outlook on situation. She was easily able to exit bed, stand, and ambulate with RW without physical assistance. Knows her PWB restrictions while able to adhere throughout. No LOB or safety concern. Recommend DC home with OP PT to continue to progress knee ROM/ strength.    Recommendations for follow up therapy are one component of a multi-disciplinary discharge planning process, led by the attending physician.  Recommendations may be updated based on patient status, additional functional criteria and insurance authorization.  Follow Up Recommendations  Outpatient PT     Equipment Recommendations  Rolling walker with 5" wheels    Recommendations for Other Services       Precautions / Restrictions Restrictions Weight Bearing Restrictions: Yes LLE Weight Bearing: Partial weight bearing LLE Partial Weight Bearing Percentage or Pounds: 50%     Mobility  Bed Mobility Overal bed mobility: Modified Independent Bed Mobility: Supine to  Sit;Sit to Supine     Supine to sit: Modified independent (Device/Increase time) Sit to supine: Modified independent (Device/Increase time)   General bed mobility comments: Does have to use non operative LE to advance however no physical assistance or cues    Transfers Overall transfer level: Modified independent Equipment used: Rolling walker (2 wheeled) Transfers: Sit to/from Stand Sit to Stand: Modified independent (Device/Increase time)         General transfer comment: pt was easily able to stand from lowest bed height. no cues required. Pt is aware of PWB restrictions  Ambulation/Gait Ambulation/Gait assistance: Supervision Gait Distance (Feet): 200 Feet Assistive device: Rolling walker (2 wheeled)   Gait velocity: WNL   General Gait Details: pt was easily and safely able to ambulate 1 lap around hallway without safety concern.   Stairs Stairs:  (will perform in future sessions. Will be admitted through the weekend)       Balance Overall balance assessment: Modified Independent        Cognition Arousal/Alertness: Awake/alert Behavior During Therapy: WFL for tasks assessed/performed Overall Cognitive Status: Within Functional Limits for tasks assessed      General Comments: Pt is A and O x 4 and extremely pleasant and cooperative.         General Comments General comments (skin integrity, edema, etc.): Pt was able to independently and correctly state proper performance of ther ex handout. no physical assistance required.      Pertinent Vitals/Pain Pain Assessment: 0-10 Pain Score: 3  Pain Location: L knee Pain Descriptors / Indicators: Sore Pain Intervention(s): Limited activity within patient's tolerance;Monitored during session;Premedicated before session;Repositioned     PT Goals (current goals can now be found in  the care plan section) Acute Rehab PT Goals Patient Stated Goal: to go home Progress towards PT goals: Progressing toward goals     Frequency    7X/week      PT Plan Current plan remains appropriate       AM-PAC PT "6 Clicks" Mobility   Outcome Measure  Help needed turning from your back to your side while in a flat bed without using bedrails?: None Help needed moving from lying on your back to sitting on the side of a flat bed without using bedrails?: None Help needed moving to and from a bed to a chair (including a wheelchair)?: None Help needed standing up from a chair using your arms (e.g., wheelchair or bedside chair)?: A Little Help needed to walk in hospital room?: A Little Help needed climbing 3-5 steps with a railing? : A Little 6 Click Score: 21    End of Session Equipment Utilized During Treatment: Gait belt Activity Tolerance: Patient tolerated treatment well Patient left: in bed;with call bell/phone within reach;with bed alarm set Nurse Communication: Mobility status PT Visit Diagnosis: Other abnormalities of gait and mobility (R26.89);Muscle weakness (generalized) (M62.81);Pain Pain - Right/Left: Left Pain - part of body: Knee     Time: 0840-0900 PT Time Calculation (min) (ACUTE ONLY): 20 min  Charges:  $Gait Training: 8-22 mins                     Julaine Fusi PTA 08/06/21, 9:11 AM

## 2021-08-07 LAB — CBC
HCT: 33.5 % — ABNORMAL LOW (ref 36.0–46.0)
Hemoglobin: 11.2 g/dL — ABNORMAL LOW (ref 12.0–15.0)
MCH: 29.5 pg (ref 26.0–34.0)
MCHC: 33.4 g/dL (ref 30.0–36.0)
MCV: 88.2 fL (ref 80.0–100.0)
Platelets: 322 10*3/uL (ref 150–400)
RBC: 3.8 MIL/uL — ABNORMAL LOW (ref 3.87–5.11)
RDW: 12.8 % (ref 11.5–15.5)
WBC: 6.4 10*3/uL (ref 4.0–10.5)
nRBC: 0 % (ref 0.0–0.2)

## 2021-08-07 NOTE — Progress Notes (Signed)
  Subjective: 4 Days Post-Op Procedure(s) (LRB): LEFT KNEE REPEAT IRRIGATION AND DEBRIDEMENT WITH BONE CULTURES. ARHTROSCOPIC (Left) Family at bedside. Patient reports pain as 2 on 0-10 scale today, more severe when weightbearing. Patient is well, and has had no acute complaints or problems Plan is to go Home after hospital stay. Negative for chest pain and shortness of breath Fever: no Gastrointestinal: negative for nausea and vomiting.  Patient has had a bowel movement.  Objective: Vital signs in last 24 hours: Temp:  [97.7 F (36.5 C)-98.7 F (37.1 C)] 97.9 F (36.6 C) (10/16 1149) Pulse Rate:  [65-87] 87 (10/16 1149) Resp:  [16-18] 16 (10/16 1149) BP: (112-139)/(68-97) 133/80 (10/16 1149) SpO2:  [94 %-99 %] 97 % (10/16 1149)  Intake/Output from previous day:  Intake/Output Summary (Last 24 hours) at 08/07/2021 1226 Last data filed at 08/06/2021 2253 Gross per 24 hour  Intake 684.85 ml  Output --  Net 684.85 ml    Intake/Output this shift: No intake/output data recorded.  Labs: Recent Labs    08/05/21 0545  HGB 10.2*   Recent Labs    08/05/21 0545  WBC 6.2  RBC 3.51*  HCT 30.9*  PLT 237   Recent Labs    08/05/21 0545 08/06/21 0445  NA 138 141  K 3.8 3.8  CL 102 102  CO2 29 32  BUN 14 15  CREATININE 0.64 0.65  GLUCOSE 122* 110*  CALCIUM 8.4* 9.3   No results for input(s): LABPT, INR in the last 72 hours.   EXAM General - Patient is Alert, Appropriate, and Oriented Extremity - Neurovascular intact Dorsiflexion/Plantar flexion intact Compartment soft Dressing/Incision -No drainage noted to Ace wrap, no drainage noted on Kerlix underneath Motor Function - intact, moving foot and toes well on exam.      Assessment/Plan: 4 Days Post-Op Procedure(s) (LRB): LEFT KNEE REPEAT IRRIGATION AND DEBRIDEMENT WITH BONE CULTURES. ARHTROSCOPIC (Left) Active Problems:   Septic arthritis of knee, left (HCC)  Estimated body mass index is 38.98 kg/m as  calculated from the following:   Height as of this encounter: 5\' 4"  (1.626 m).   Weight as of this encounter: 103 kg. Advance diet Up with therapy  Cultures show rare Staph Aureus from medial femoral condyle biopsy. Other cx's show no growth.  PICC line has been placed. Continuing IV abx.  Plan remains repeat I&D next week with Dr Roland Rack.  Per I&D, likely 6-8 weeks IV abx prior to prolonged course of oral abx.   Repeat CBC ordered.    DVT Prophylaxis - Lovenox and foot pumps 50% Weight-Bearing to left leg  Cassell Smiles, PA-C [facility] Surgery 08/07/2021, 12:26 PM

## 2021-08-07 NOTE — Progress Notes (Addendum)
Physical Therapy Treatment Patient Details Name: Krista Blake MRN: [id number] DOB: 08-15-1966 Today's Date: 08/07/2021   History of Present Illness Pt is a 55 y.o. female presenting to hospital 10/12 with acute onset of L knee pain s/p 2 separate I&D of L knee for underlying septic arthritis.  08/03/21 s/p arthroscopic I&D with bone biopsies x2 d/t acute septic arthritis with underlying DJD of L knee.  PMH includes htn; arthroscopic irrigation and debridement with extensive synovectomy, partial medial and lateral meniscectomies, and abrasion chondroplasty of grade III chondromalacia femoral trochlea, left knee Left 05/04/2021 (Dr. Roland Rack); and arthroscopic irrigation debridement with extensive debridement and abrasion chondroplasty of medial femoral condyle, lateral femoral condyle, and patella, left knee Left 06/07/2021 (Dr. Roland Rack).    PT Comments    OOB to bathroom and completed x 1 lap on unit with ease.  Awaiting surgical intervention early this week. Pt does HEP on her own.   Recommendations for follow up therapy are one component of a multi-disciplinary discharge planning process, led by the attending physician.  Recommendations may be updated based on patient status, additional functional criteria and insurance authorization.  Follow Up Recommendations  Outpatient PT     Equipment Recommendations  Rolling walker with 5" wheels    Recommendations for Other Services       Precautions / Restrictions Precautions Precautions: Fall Precaution Comments: R PICC line Restrictions Weight Bearing Restrictions: Yes LLE Weight Bearing: Partial weight bearing LLE Partial Weight Bearing Percentage or Pounds: 50% Other Position/Activity Restrictions: L knee ROM per pt tolerance (limited prior to surgery)--per discussion with MD Poggi 08/04/21     Mobility  Bed Mobility Overal bed mobility: Modified Independent                  Transfers Overall transfer level:  Modified independent Equipment used: Rolling walker (2 wheeled) Transfers: Sit to/from Stand Sit to Stand: Modified independent (Device/Increase time)            Ambulation/Gait Ambulation/Gait assistance: Supervision Gait Distance (Feet): 200 Feet Assistive device: Rolling walker (2 wheeled)   Gait velocity: WNL   General Gait Details: pt was easily and safely able to ambulate 1 lap around hallway without safety concern.   Stairs             Wheelchair Mobility    Modified Rankin (Stroke Patients Only)       Balance Overall balance assessment: Modified Independent                                          Cognition Arousal/Alertness: Awake/alert Behavior During Therapy: WFL for tasks assessed/performed Overall Cognitive Status: Within Functional Limits for tasks assessed                                 General Comments: Pt is A and O x 4 and extremely pleasant and cooperative.      Exercises      General Comments        Pertinent Vitals/Pain Pain Assessment: 0-10 Pain Score: 3  Pain Location: L knee Pain Descriptors / Indicators: Sore Pain Intervention(s): Limited activity within patient's tolerance;Monitored during session    Home Living                      Prior Function  PT Goals (current goals can now be found in the care plan section) Progress towards PT goals: Progressing toward goals    Frequency    7X/week      PT Plan Current plan remains appropriate    Co-evaluation              AM-PAC PT "6 Clicks" Mobility   Outcome Measure  Help needed turning from your back to your side while in a flat bed without using bedrails?: None Help needed moving from lying on your back to sitting on the side of a flat bed without using bedrails?: None Help needed moving to and from a bed to a chair (including a wheelchair)?: None Help needed standing up from a chair using your arms  (e.g., wheelchair or bedside chair)?: A Little Help needed to walk in hospital room?: A Little Help needed climbing 3-5 steps with a railing? : A Little 6 Click Score: 21    End of Session Equipment Utilized During Treatment: Gait belt Activity Tolerance: Patient tolerated treatment well Patient left: in bed;with call bell/phone within reach;with bed alarm set Nurse Communication: Mobility status PT Visit Diagnosis: Other abnormalities of gait and mobility (R26.89);Muscle weakness (generalized) (M62.81);Pain Pain - Right/Left: Left Pain - part of body: Knee     Time: 2878-6767 PT Time Calculation (min) (ACUTE ONLY): 18 min  Charges:  $Gait Training: 8-22 mins                    Chesley Noon, PTA 08/07/21, 10:13 AM

## 2021-08-08 ENCOUNTER — Inpatient Hospital Stay: Payer: BC Managed Care – PPO | Admitting: Certified Registered"

## 2021-08-08 ENCOUNTER — Encounter: Payer: Self-pay | Admitting: Surgery

## 2021-08-08 ENCOUNTER — Encounter
Admission: RE | Disposition: A | Discharge status: Home / Self Care | Payer: Self-pay | Source: Home / Self Care | Attending: Surgery

## 2021-08-08 HISTORY — PX: KNEE ARTHROSCOPY: SHX127

## 2021-08-08 LAB — AEROBIC/ANAEROBIC CULTURE W GRAM STAIN (SURGICAL/DEEP WOUND)
Culture: NO GROWTH
Culture: NO GROWTH
Gram Stain: NONE SEEN
Gram Stain: NONE SEEN

## 2021-08-08 SURGERY — ARTHROSCOPY, KNEE
Anesthesia: General | Site: Knee | Laterality: Left

## 2021-08-08 MED ORDER — KETAMINE HCL 50 MG/5ML IJ SOSY
PREFILLED_SYRINGE | INTRAMUSCULAR | Status: AC
  Filled 2021-08-08: qty 5

## 2021-08-08 MED ORDER — HYDROCODONE-ACETAMINOPHEN 5-325 MG PO TABS
1.0000 | ORAL_TABLET | ORAL | Status: DC | PRN
Start: 2021-08-08 — End: 2021-08-10

## 2021-08-08 MED ORDER — FENTANYL CITRATE (PF) 100 MCG/2ML IJ SOLN
INTRAMUSCULAR | Status: AC
  Filled 2021-08-08: qty 2

## 2021-08-08 MED ORDER — NEOMYCIN-POLYMYXIN B GU 40-200000 IR SOLN
Status: AC
  Filled 2021-08-08: qty 20

## 2021-08-08 MED ORDER — ACETAMINOPHEN 325 MG PO TABS: 325.0000 mg | ORAL_TABLET | Freq: Four times a day (QID) | ORAL | Status: DC | PRN

## 2021-08-08 MED ORDER — LABETALOL HCL 5 MG/ML IV SOLN
INTRAVENOUS | Status: DC | PRN
  Administered 2021-08-08: 5 mg via INTRAVENOUS

## 2021-08-08 MED ORDER — ACETAMINOPHEN 10 MG/ML IV SOLN
INTRAVENOUS | Status: DC | PRN
  Administered 2021-08-08: 1000 mg via INTRAVENOUS

## 2021-08-08 MED ORDER — BUPIVACAINE-EPINEPHRINE (PF) 0.5% -1:200000 IJ SOLN
INTRAMUSCULAR | Status: AC
  Filled 2021-08-08: qty 30

## 2021-08-08 MED ORDER — MORPHINE SULFATE (PF) 2 MG/ML IV SOLN: 0.5000 mg | INTRAVENOUS | Status: DC | PRN

## 2021-08-08 MED ORDER — ACETAMINOPHEN 10 MG/ML IV SOLN
INTRAVENOUS | Status: AC
  Filled 2021-08-08: qty 100

## 2021-08-08 MED ORDER — LACTATED RINGERS IV SOLN: INTRAVENOUS | Status: DC | PRN

## 2021-08-08 MED ORDER — LIDOCAINE HCL (PF) 1 % IJ SOLN
INTRAMUSCULAR | Status: AC
  Filled 2021-08-08: qty 30

## 2021-08-08 MED ORDER — HYDROCORTISONE 0.5 % EX CREA
TOPICAL_CREAM | Freq: Four times a day (QID) | CUTANEOUS | Status: DC | PRN
  Administered 2021-08-08: 1 via TOPICAL
  Filled 2021-08-08: qty 28.35

## 2021-08-08 MED ORDER — FENTANYL CITRATE (PF) 100 MCG/2ML IJ SOLN
INTRAMUSCULAR | Status: DC | PRN
  Administered 2021-08-08 (×2): 50 ug via INTRAVENOUS

## 2021-08-08 MED ORDER — DEXMEDETOMIDINE (PRECEDEX) IN NS 20 MCG/5ML (4 MCG/ML) IV SYRINGE
PREFILLED_SYRINGE | INTRAVENOUS | Status: DC | PRN
  Administered 2021-08-08: 8 ug via INTRAVENOUS
  Administered 2021-08-08: 4 ug via INTRAVENOUS
  Administered 2021-08-08: 8 ug via INTRAVENOUS

## 2021-08-08 MED ORDER — KETOROLAC TROMETHAMINE 15 MG/ML IJ SOLN
7.5000 mg | Freq: Four times a day (QID) | INTRAMUSCULAR | Status: AC
  Administered 2021-08-08 – 2021-08-09 (×4): 7.5 mg via INTRAVENOUS
  Filled 2021-08-08 (×4): qty 1

## 2021-08-08 MED ORDER — MIDAZOLAM HCL 2 MG/2ML IJ SOLN
INTRAMUSCULAR | Status: DC | PRN
  Administered 2021-08-08: 2 mg via INTRAVENOUS

## 2021-08-08 MED ORDER — LIDOCAINE HCL (CARDIAC) PF 100 MG/5ML IV SOSY
PREFILLED_SYRINGE | INTRAVENOUS | Status: DC | PRN
  Administered 2021-08-08: 100 mg via INTRAVENOUS

## 2021-08-08 MED ORDER — DIPHENHYDRAMINE HCL 12.5 MG/5ML PO ELIX: 12.5000 mg | ORAL_SOLUTION | ORAL | Status: DC | PRN

## 2021-08-08 MED ORDER — ONDANSETRON HCL 4 MG/2ML IJ SOLN
4.0000 mg | Freq: Once | INTRAMUSCULAR | Status: DC | PRN
Start: 2021-08-08 — End: 2021-08-08

## 2021-08-08 MED ORDER — MIDAZOLAM HCL 2 MG/2ML IJ SOLN
INTRAMUSCULAR | Status: AC
  Filled 2021-08-08: qty 2

## 2021-08-08 MED ORDER — KETOROLAC TROMETHAMINE 30 MG/ML IJ SOLN
30.0000 mg | Freq: Once | INTRAMUSCULAR | Status: DC
Start: 2021-08-08 — End: 2021-08-08

## 2021-08-08 MED ORDER — METOCLOPRAMIDE HCL 10 MG PO TABS: 5.0000 mg | ORAL_TABLET | Freq: Three times a day (TID) | ORAL | Status: DC | PRN

## 2021-08-08 MED ORDER — DOCUSATE SODIUM 100 MG PO CAPS
100.0000 mg | ORAL_CAPSULE | Freq: Two times a day (BID) | ORAL | Status: DC
  Administered 2021-08-08 – 2021-08-10 (×4): 100 mg via ORAL
  Filled 2021-08-08 (×4): qty 1

## 2021-08-08 MED ORDER — OXYCODONE HCL 5 MG PO TABS: 10.0000 mg | ORAL_TABLET | Freq: Once | ORAL | Status: DC | PRN

## 2021-08-08 MED ORDER — RINGERS IRRIGATION IR SOLN
Status: DC | PRN
  Administered 2021-08-08 (×2): 3000 mL

## 2021-08-08 MED ORDER — PROPOFOL 500 MG/50ML IV EMUL
INTRAVENOUS | Status: DC | PRN
  Administered 2021-08-08: 125 ug/kg/min via INTRAVENOUS

## 2021-08-08 MED ORDER — ONDANSETRON HCL 4 MG PO TABS: 4.0000 mg | ORAL_TABLET | Freq: Four times a day (QID) | ORAL | Status: DC | PRN

## 2021-08-08 MED ORDER — PROPOFOL 500 MG/50ML IV EMUL
INTRAVENOUS | Status: AC
  Filled 2021-08-08: qty 50

## 2021-08-08 MED ORDER — FENTANYL CITRATE (PF) 100 MCG/2ML IJ SOLN
25.0000 ug | INTRAMUSCULAR | Status: DC | PRN
  Administered 2021-08-08 (×4): 25 ug via INTRAVENOUS

## 2021-08-08 MED ORDER — ONDANSETRON HCL 4 MG/2ML IJ SOLN: 4.0000 mg | Freq: Four times a day (QID) | INTRAMUSCULAR | Status: DC | PRN

## 2021-08-08 MED ORDER — KETAMINE HCL 10 MG/ML IJ SOLN
INTRAMUSCULAR | Status: DC | PRN
  Administered 2021-08-08: 50 mg via INTRAVENOUS

## 2021-08-08 MED ORDER — LABETALOL HCL 5 MG/ML IV SOLN
INTRAVENOUS | Status: AC
  Filled 2021-08-08: qty 4

## 2021-08-08 MED ORDER — KETOROLAC TROMETHAMINE 30 MG/ML IJ SOLN
INTRAMUSCULAR | Status: DC | PRN
Start: 2021-08-08 — End: 2021-08-08
  Administered 2021-08-08: 30 mg via INTRAVENOUS

## 2021-08-08 MED ORDER — TRAMADOL HCL 50 MG PO TABS: 50.0000 mg | ORAL_TABLET | Freq: Four times a day (QID) | ORAL | Status: DC | PRN

## 2021-08-08 MED ORDER — NEOMYCIN-POLYMYXIN B GU 40-200000 IR SOLN
Status: DC | PRN
  Administered 2021-08-08: 3012 mL

## 2021-08-08 MED ORDER — BUPIVACAINE-EPINEPHRINE (PF) 0.5% -1:200000 IJ SOLN
INTRAMUSCULAR | Status: AC
  Filled 2021-08-08: qty 60

## 2021-08-08 MED ORDER — BISACODYL 10 MG RE SUPP: 10.0000 mg | Freq: Every day | RECTAL | Status: DC | PRN

## 2021-08-08 MED ORDER — ENOXAPARIN SODIUM 40 MG/0.4ML IJ SOSY
40.0000 mg | PREFILLED_SYRINGE | INTRAMUSCULAR | Status: DC
  Administered 2021-08-09 – 2021-08-10 (×2): 40 mg via SUBCUTANEOUS
  Filled 2021-08-08 (×2): qty 0.4

## 2021-08-08 MED ORDER — PROPOFOL 10 MG/ML IV BOLUS
INTRAVENOUS | Status: DC | PRN
  Administered 2021-08-08: 30 mg via INTRAVENOUS
  Administered 2021-08-08: 150 mg via INTRAVENOUS

## 2021-08-08 MED ORDER — METOCLOPRAMIDE HCL 5 MG/ML IJ SOLN: 5.0000 mg | Freq: Three times a day (TID) | INTRAMUSCULAR | Status: DC | PRN

## 2021-08-08 MED ORDER — LIDOCAINE HCL (PF) 2 % IJ SOLN
INTRAMUSCULAR | Status: AC
  Filled 2021-08-08: qty 5

## 2021-08-08 MED ORDER — FLEET ENEMA 7-19 GM/118ML RE ENEM: 1.0000 | ENEMA | Freq: Once | RECTAL | Status: DC | PRN

## 2021-08-08 MED ORDER — BUPIVACAINE-EPINEPHRINE (PF) 0.5% -1:200000 IJ SOLN
INTRAMUSCULAR | Status: DC | PRN
  Administered 2021-08-08: 20 mL via PERINEURAL

## 2021-08-08 MED ORDER — MAGNESIUM HYDROXIDE 400 MG/5ML PO SUSP
30.0000 mL | Freq: Every day | ORAL | Status: DC | PRN
  Filled 2021-08-08: qty 30

## 2021-08-08 SURGICAL SUPPLY — 41 items
APL PRP STRL LF DISP 70% ISPRP (MISCELLANEOUS) ×1
BAG COUNTER SPONGE SURGICOUNT (BAG) ×1 IMPLANT
BAG SPNG CNTER NS LX DISP (BAG) ×1
BLADE FULL RADIUS 3.5 (BLADE) ×2 IMPLANT
BLADE SHAVER 4.5X7 STR FR (MISCELLANEOUS) ×1 IMPLANT
BNDG ELASTIC 6X5.8 VLCR STR LF (GAUZE/BANDAGES/DRESSINGS) ×2 IMPLANT
BNDG ESMARK 6X12 TAN STRL LF (GAUZE/BANDAGES/DRESSINGS) ×2 IMPLANT
CHLORAPREP W/TINT 26 (MISCELLANEOUS) ×2 IMPLANT
CUFF TOURN SGL QUICK 24 (TOURNIQUET CUFF)
CUFF TOURN SGL QUICK 34 (TOURNIQUET CUFF)
CUFF TRNQT CYL 24X4X16.5-23 (TOURNIQUET CUFF) IMPLANT
CUFF TRNQT CYL 34X4.125X (TOURNIQUET CUFF) IMPLANT
DRAPE IMP U-DRAPE 54X76 (DRAPES) ×2 IMPLANT
ELECT REM PT RETURN 9FT ADLT (ELECTROSURGICAL)
ELECTRODE REM PT RTRN 9FT ADLT (ELECTROSURGICAL) ×1 IMPLANT
GAUZE SPONGE 4X4 12PLY STRL (GAUZE/BANDAGES/DRESSINGS) ×2 IMPLANT
GLOVE SURG ENC MOIS LTX SZ8 (GLOVE) ×4 IMPLANT
GLOVE SURG ENC TEXT LTX SZ7 (GLOVE) ×4 IMPLANT
GLOVE SURG UNDER LTX SZ8 (GLOVE) ×3 IMPLANT
GLOVE SURG UNDER POLY LF SZ7.5 (GLOVE) ×3 IMPLANT
GOWN STRL REUS W/ TWL LRG LVL3 (GOWN DISPOSABLE) ×1 IMPLANT
GOWN STRL REUS W/ TWL XL LVL3 (GOWN DISPOSABLE) ×2 IMPLANT
GOWN STRL REUS W/TWL LRG LVL3 (GOWN DISPOSABLE) ×2
GOWN STRL REUS W/TWL XL LVL3 (GOWN DISPOSABLE) ×4
HEMOVAC LRG 3/16 (MISCELLANEOUS) ×1 IMPLANT
IV LACTATED RINGER IRRG 3000ML (IV SOLUTION) ×6
IV LR IRRIG 3000ML ARTHROMATIC (IV SOLUTION) ×1 IMPLANT
KIT TURNOVER KIT A (KITS) ×2 IMPLANT
MANIFOLD NEPTUNE II (INSTRUMENTS) ×4 IMPLANT
NDL HYPO 21X1.5 SAFETY (NEEDLE) ×1 IMPLANT
NEEDLE HYPO 21X1.5 SAFETY (NEEDLE) ×2 IMPLANT
PACK ARTHROSCOPY KNEE (MISCELLANEOUS) ×2 IMPLANT
PAD ABD DERMACEA PRESS 5X9 (GAUZE/BANDAGES/DRESSINGS) ×1 IMPLANT
PENCIL ELECTRO HAND CTR (MISCELLANEOUS) ×1 IMPLANT
SPONGE T-LAP 18X18 ~~LOC~~+RFID (SPONGE) ×2 IMPLANT
SUT PROLENE 4 0 PS 2 18 (SUTURE) ×2 IMPLANT
SUT TICRON COATED BLUE 2 0 30 (SUTURE) IMPLANT
SYR 50ML LL SCALE MARK (SYRINGE) ×2 IMPLANT
TUBING INFLOW SET DBFLO PUMP (TUBING) ×2 IMPLANT
WAND WEREWOLF FLOW 90D (MISCELLANEOUS) ×2 IMPLANT
WATER STERILE IRR 500ML POUR (IV SOLUTION) ×1 IMPLANT

## 2021-08-08 NOTE — Plan of Care (Signed)
  Problem: Education: Goal: Knowledge of General Education information will improve Description: Including pain rating scale, medication(s)/side effects and non-pharmacologic comfort measures Outcome: Progressing   Problem: Clinical Measurements: Goal: Respiratory complications will improve Outcome: Progressing Goal: Cardiovascular complication will be avoided Outcome: Progressing   Problem: Coping: Goal: Level of anxiety will decrease Outcome: Progressing   Problem: Pain Managment: Goal: General experience of comfort will improve Outcome: Progressing   Problem: Safety: Goal: Ability to remain free from injury will improve Outcome: Progressing   Problem: Skin Integrity: Goal: Risk for impaired skin integrity will decrease Outcome: Progressing   

## 2021-08-08 NOTE — Transfer of Care (Signed)
Immediate Anesthesia Transfer of Care Note  Patient: Krista Blake  Procedure(s) Performed: ARTHROSCOPY KNEE (Left: Knee)  Patient Location: PACU  Anesthesia Type:General  Level of Consciousness: awake, drowsy and patient cooperative  Airway & Oxygen Therapy: Patient Spontanous Breathing and Patient connected to face mask oxygen  Post-op Assessment: Report given to RN and Post -op Vital signs reviewed and stable  Post vital signs: Reviewed and stable  Last Vitals:  Vitals Value Taken Time  BP 142/85 08/08/21 1730  Temp    Pulse 65 08/08/21 1735  Resp 15 08/08/21 1735  SpO2 100 % 08/08/21 1735  Vitals shown include unvalidated device data.  Last Pain:  Vitals:   08/08/21 1514  TempSrc: Temporal  PainSc: 0-No pain      Patients Stated Pain Goal: 2 (40/98/28 6751)  Complications: No notable events documented.

## 2021-08-08 NOTE — Progress Notes (Signed)
Patient alert and oriented x 4, complains of pain to left knee, relieved with prn pain medications. Small friction irritation located to left knee site under dressing, patient reports site was itching. Dr. Roland Rack paged and orders provided for prn hydrocortisone. Right Picc dressing changed and line remain patent. Ambulates in the room with walker and assist. Vitals stable, will continue to monitor.

## 2021-08-08 NOTE — Anesthesia Preprocedure Evaluation (Addendum)
Anesthesia Evaluation  Patient identified by MRN, date of birth, ID band Patient awake    Reviewed: Allergy & Precautions, H&P , NPO status , Patient's Chart, lab work & pertinent test results, reviewed documented beta blocker date and time   Airway Mallampati: III  TM Distance: >3 FB Neck ROM: full    Dental  (+) Teeth Intact   Pulmonary neg pulmonary ROS,    Pulmonary exam normal        Cardiovascular Exercise Tolerance: Good hypertension, Normal cardiovascular exam Rate:Normal     Neuro/Psych negative neurological ROS  negative psych ROS   GI/Hepatic negative GI ROS, Neg liver ROS,   Endo/Other  negative endocrine ROS  Renal/GU negative Renal ROS  negative genitourinary   Musculoskeletal  (+) Arthritis ,   Abdominal (+) + obese,   Peds  Hematology negative hematology ROS (+)   Anesthesia Other Findings   Reproductive/Obstetrics negative OB ROS                            Anesthesia Physical Anesthesia Plan  ASA: 2  Anesthesia Plan: General LMA   Post-op Pain Management:    Induction:   PONV Risk Score and Plan: 3 and Ondansetron, Midazolam and Treatment may vary due to age or medical condition  Airway Management Planned: LMA  Additional Equipment:   Intra-op Plan:   Post-operative Plan: Extubation in OR  Informed Consent: I have reviewed the patients History and Physical, chart, labs and discussed the procedure including the risks, benefits and alternatives for the proposed anesthesia with the patient or authorized representative who has indicated his/her understanding and acceptance.       Plan Discussed with: CRNA and Anesthesiologist  Anesthesia Plan Comments:        Anesthesia Quick Evaluation

## 2021-08-08 NOTE — Progress Notes (Signed)
[facility] INFECTIOUS DISEASE PROGRESS NOTE Date of Admission:  08/03/2021     ID: Krista Blake is a 55 y.o. female with  Septic knee Active Problems:   Septic arthritis of knee, left (HCC)   Subjective: No fevers, culture from bone with MSSA.  S/p repeat surg yesterday  ROS  Eleven systems are reviewed and negative except per hpi  Medications:  Antibiotics Given (last 72 hours)     Date/Time Action Medication Dose Rate   08/05/21 1629 New Bag/Given   ceFAZolin (ANCEF) IVPB 2g/100 mL premix 2 g 200 mL/hr   08/05/21 1634 New Bag/Given   vancomycin (VANCOREADY) IVPB 750 mg/150 mL 750 mg 150 mL/hr   08/05/21 2118 New Bag/Given   ceFAZolin (ANCEF) IVPB 2g/100 mL premix 2 g 200 mL/hr   08/06/21 0416 New Bag/Given   vancomycin (VANCOREADY) IVPB 750 mg/150 mL 750 mg 150 mL/hr   08/06/21 0541 New Bag/Given   ceFAZolin (ANCEF) IVPB 2g/100 mL premix 2 g 200 mL/hr   08/06/21 1332 New Bag/Given   ceFAZolin (ANCEF) IVPB 2g/100 mL premix 2 g 200 mL/hr   08/06/21 1611 New Bag/Given   vancomycin (VANCOREADY) IVPB 750 mg/150 mL 750 mg 150 mL/hr   08/06/21 2250 New Bag/Given   ceFAZolin (ANCEF) IVPB 2g/100 mL premix 2 g 200 mL/hr   08/07/21 0532 New Bag/Given   vancomycin (VANCOREADY) IVPB 750 mg/150 mL 750 mg 150 mL/hr   08/07/21 2542 New Bag/Given   ceFAZolin (ANCEF) IVPB 2g/100 mL premix 2 g 200 mL/hr   08/07/21 1331 New Bag/Given   ceFAZolin (ANCEF) IVPB 2g/100 mL premix 2 g 200 mL/hr   08/07/21 1746 New Bag/Given   vancomycin (VANCOREADY) IVPB 750 mg/150 mL 750 mg 150 mL/hr   08/07/21 2132 New Bag/Given   ceFAZolin (ANCEF) IVPB 2g/100 mL premix 2 g 200 mL/hr   08/08/21 7062 New Bag/Given   ceFAZolin (ANCEF) IVPB 2g/100 mL premix 2 g 200 mL/hr   08/08/21 1348 New Bag/Given   ceFAZolin (ANCEF) IVPB 2g/100 mL premix 2 g 200 mL/hr       allopurinol  100 mg Oral Daily   Chlorhexidine Gluconate Cloth  6 each Topical Daily   docusate sodium  100 mg Oral BID    enoxaparin (LOVENOX) injection  40 mg Subcutaneous Q24H   hydrochlorothiazide  25 mg Oral Daily   magnesium oxide  200 mg Oral Daily   melatonin  5 mg Oral QHS   PARoxetine  20 mg Oral Daily   propranolol  20 mg Oral Daily   rosuvastatin  10 mg Oral Daily   sodium chloride flush  10-40 mL Intracatheter Q12H    Objective: Vital signs in last 24 hours: Temp:  [97.7 F (36.5 C)-98.5 F (36.9 C)] 98.2 F (36.8 C) (10/17 1157) Pulse Rate:  [70-82] 75 (10/17 1157) Resp:  [17-18] 18 (10/17 1157) BP: (115-132)/(70-80) 122/74 (10/17 1157) SpO2:  [92 %-97 %] 92 % (10/17 1157) Constitutional:  oriented to person, place, and time. appears well-developed and well-nourished. No distress.  HENT: [place]/AT, PERRLA, no scleral icterus Mouth/Throat: Oropharynx is clear and moist..  Cardiovascular: Normal rate, regular rhythm and normal heart sounds.  PICC in place Abdominal: Soft. Bowel sounds are normal.  exhibits no distension. There is no tenderness.  Ext L knee wrapped post op Lymphadenopathy: no cervical adenopathy. No axillary adenopathy Neurological: alert and oriented to person, place, and time.  Skin: Skin is warm and dry. She has lots of small skin scabs, none infected apperaing  Lab Results Recent Labs    08/06/21 0445 08/07/21 1330  WBC  --  6.4  HGB  --  11.2*  HCT  --  33.5*  NA 141  --   K 3.8  --   CL 102  --   CO2 32  --   BUN 15  --   CREATININE 0.65  --     Microbiology: Results for orders placed or performed during the hospital encounter of 08/03/21  Aerobic/Anaerobic Culture w Gram Stain (surgical/deep wound)     Status: None (Preliminary result)   Collection Time: 08/03/21  2:31 PM   Specimen: Wound; Body Fluid  Result Value Ref Range Status   Specimen Description   Final    WOUND Performed at [facility], [address], [place], [place] [address]    Special Requests   Final    left knee body fluid Performed at [facility], [place], [place] [address]    Gram Stain   Final    MODERATE WBC PRESENT,BOTH PMN AND MONONUCLEAR NO ORGANISMS SEEN    Culture   Final    NO GROWTH 4 DAYS NO ANAEROBES ISOLATED; CULTURE IN PROGRESS FOR 5 DAYS Performed at [facility], [address] [place], [place] [address]    Report Status PENDING  Incomplete  Aerobic/Anaerobic Culture w Gram Stain (surgical/deep wound)     Status: None (Preliminary result)   Collection Time: 08/03/21  3:03 PM   Specimen: Wound; Tissue  Result Value Ref Range Status   Specimen Description   Final    WOUND Performed at [facility], [address], [place], [place] [address]    Special Requests   Final    LEFT KNEE BONE BIOPSY LATERAL FEMORAL Performed at [facility], [place], [place] [address]    Gram Stain NO WBC SEEN NO ORGANISMS SEEN   Final   Culture   Final    NO GROWTH 4 DAYS NO ANAEROBES ISOLATED; CULTURE IN PROGRESS FOR 5 DAYS Performed at [facility], [address] [place], [place] [address]    Report Status PENDING  Incomplete  Aerobic/Anaerobic Culture w Gram Stain (surgical/deep wound)     Status: None (Preliminary result)   Collection Time: 08/03/21  3:20 PM   Specimen: Wound; Tissue  Result Value Ref Range Status   Specimen Description   Final    WOUND Performed at [facility], [address] [place], [place] [address]    Special Requests   Final    left knee biopsy medial Performed at [facility], [address] [place],  [address]    Gram Stain   Final    NO WBC SEEN NO ORGANISMS SEEN Performed at [facility], [place] [address], [place],  [address]    Culture   Final    RARE STAPHYLOCOCCUS AUREUS NO ANAEROBES ISOLATED; CULTURE IN PROGRESS FOR 5 DAYS    Report Status PENDING  Incomplete   Organism ID, Bacteria STAPHYLOCOCCUS AUREUS  Final      Susceptibility   Staphylococcus aureus - MIC*    CIPROFLOXACIN <=0.5  SENSITIVE Sensitive     ERYTHROMYCIN <=0.25 SENSITIVE Sensitive     GENTAMICIN <=0.5 SENSITIVE Sensitive     OXACILLIN 0.5 SENSITIVE Sensitive     TETRACYCLINE <=1 SENSITIVE Sensitive     VANCOMYCIN 1 SENSITIVE Sensitive     TRIMETH/SULFA <=10 SENSITIVE Sensitive  CLINDAMYCIN <=0.25 SENSITIVE Sensitive     RIFAMPIN <=0.5 SENSITIVE Sensitive     Inducible Clindamycin NEGATIVE Sensitive     * RARE STAPHYLOCOCCUS AUREUS    Studies/Results: No results found.  Assessment/Plan: Aspin Palomarez is a 55 y.o. female with  recurrent MSSA septic arthritis s/p I and D x 3 now. She has neurodermatitis and lots of skin scabs a likely source of infection through hematogenous spread. Also had gout noted in July on aspiration and was treated with intraart steroids and no abx as initial MSSA cx was felt contaminant. She preveiously received 4 weeks IV abx and 2 weeks oral with a good response clinically and in inflammatory markers but now with recurrence and MRI findings of osteomyelitis of femur and tibia.  She is s/p washout 10/12- cx with staph again. Planned repeat wash out next week CRP 4, ESR 60 10/14 doing well.  Picc in place.   October 18  Culture with MSSA.  On 10/17 s/p  repeat surgery.  Continues just on cefazolin   Recommendations Cont cefazolin Will plan on very prolonged oral course of  oral abx after a min 6-8 weeks of IV abx from repeat surgery 10/17. I can see in clinic on Nov 8 1 pm at [facility] internal medicine  Will sign off but please call with questions Leonel Ramsay   08/08/2021, 1:49 PM

## 2021-08-08 NOTE — Op Note (Signed)
08/08/2021  5:31 PM  Patient:   Krista Blake  Pre-Op Diagnosis:   Septic arthritis left knee.  Postoperative diagnosis:   Same  Procedure:   Arthroscopic irrigation and debridement of left knee.  Surgeon:   Pascal Lux, MD  Anesthesia:   General LMA  Findings:   As above.   Complications:   None  EBL:   5 cc.  Total fluids:   300 cc of crystalloid.  Tourniquet time:   49 minutes at 300 mmHg  Drains:   Hemovac drain x1  Closure:   4-0 Prolene interrupted sutures.  Brief clinical note:   The patient is a 55 year old female with a history of a septic left knee.  She is now 6 days status post a formal irrigation and debridement of the left knee with bone biopsies obtained which demonstrated osteomyelitis with methicillin sensitive staph aureus.  The patient is on appropriate IV antibiotics.  The patient presents at this time for a repeat arthroscopic irrigation and debridement of her left knee.  Procedure:   The patient was brought into the operating room and lain in the supine position.  After adequate general laryngeal mask anesthesia was obtained, the left lower extremity was prepped with ChloraPrep solution before being draped sterilely.  Preoperative antibiotics were administered.  A timeout was performed to verify the appropriate surgical site before the expected portal sites were injected with 0.5% Sensorcaine with epinephrine.  The leg was elevated for several minutes before the tourniquet was inflated to 300 mmHg.  The previously placed sutures were removed from the portal sites before the camera was placed in the anterolateral portal and instrumentation performed through the anteromedial portal.   The knee was sequentially examined beginning in the suprapatellar pouch, then progressing to the patellofemoral space, the medial gutter and compartment, the notch, and finally the lateral compartment and gutter.  The findings were as described above.  The knee was  thoroughly irrigated utilizing 3 L of lactated Ringer's solution followed by 3 L of antibiotic irrigation followed by another 3 L of lactated Ringer's solution.  During this irrigation process, the knee was debrided thoroughly using the 3.5 full-radius resector.   The portal sites were closed using 4-0 Prolene interrupted sutures before a sterile bulky dressing was applied to the knee.  In addition, a large Hemovac drain was placed through the superolateral portal site into the knee.  The patient was then awakened, extubated, and returned to the recovery room in satisfactory condition after tolerating the procedure well.

## 2021-08-08 NOTE — Progress Notes (Signed)
Infectious Disease Long Term IV Antibiotic Orders Krista Blake Aug 15, 1966  Diagnosis: Recurrent MSSA septic knee and osteomyelitis   Culture results MSSA  LABS Lab Results  Component Value Date   CREATININE 0.65 08/06/2021   Lab Results  Component Value Date   WBC 6.4 08/07/2021   HGB 11.2 (L) 08/07/2021   HCT 33.5 (L) 08/07/2021   MCV 88.2 08/07/2021   PLT 322 08/07/2021   Lab Results  Component Value Date   ESRSEDRATE 65 (H) 08/05/2021   Lab Results  Component Value Date   CRP 4.0 (H) 08/05/2021    Allergies:  Allergies  Allergen Reactions   Penicillins Hives    Reaction : Hives during college TOLERATED CEFAZOLIN AND AMOXICILLIN    Discharge antibiotics  Cefazolin      2 grams every  8 hours  PICC Care per protocol Labs weekly while on IV antibiotics -FAX weekly labs to [phone] CBC w diff   Cr CRP   Planned duration of antibiotics 6 weeks from 10/17  Stop date Nov 29    Follow up clinic date  3 weeks    Leonel Ramsay, MD

## 2021-08-08 NOTE — Progress Notes (Signed)
PT Cancellation Note  Patient Details Name: Krista Blake MRN: [id number] DOB: [birth year]   Cancelled Treatment:    Reason Eval/Treat Not Completed: Fatigue/lethargy limiting ability to participate  Pt offered session.  She stated she was fatigued and anticipates surgical intervention scheduled for 2:30 today.  Asks to opt of of therapy for today and continue after procedure.  Will continue tomorrow as appropriate.   Chesley Noon 08/08/2021, 10:05 AM

## 2021-08-08 NOTE — Anesthesia Postprocedure Evaluation (Signed)
Anesthesia Post Note  Patient: Krista Blake  Procedure(s) Performed: ARTHROSCOPY KNEE (Left: Knee)  Patient location during evaluation: PACU Anesthesia Type: General Level of consciousness: awake and alert Pain management: pain level controlled Vital Signs Assessment: post-procedure vital signs reviewed and stable Respiratory status: spontaneous breathing, nonlabored ventilation and respiratory function stable Cardiovascular status: blood pressure returned to baseline and stable Postop Assessment: no apparent nausea or vomiting Anesthetic complications: no   No notable events documented.   Last Vitals:  Vitals:   08/08/21 1804 08/08/21 1826  BP: (!) 131/58 119/85  Pulse: 69 65  Resp: 16 16  Temp: (!) 36.3 C (!) 36.4 C  SpO2: 97% 96%    Last Pain:  Vitals:   08/08/21 1826  TempSrc: Oral  PainSc: 0-No pain                 [place] Ouch

## 2021-08-09 ENCOUNTER — Encounter: Payer: Self-pay | Admitting: Surgery

## 2021-08-09 LAB — CBC
HCT: 33.9 % — ABNORMAL LOW (ref 36.0–46.0)
Hemoglobin: 11.2 g/dL — ABNORMAL LOW (ref 12.0–15.0)
MCH: 28.9 pg (ref 26.0–34.0)
MCHC: 33 g/dL (ref 30.0–36.0)
MCV: 87.4 fL (ref 80.0–100.0)
Platelets: 302 10*3/uL (ref 150–400)
RBC: 3.88 MIL/uL (ref 3.87–5.11)
RDW: 13.2 % (ref 11.5–15.5)
WBC: 5.7 10*3/uL (ref 4.0–10.5)
nRBC: 0 % (ref 0.0–0.2)

## 2021-08-09 MED ORDER — CEFAZOLIN IV (FOR PTA / DISCHARGE USE ONLY): 2.0000 g | Freq: Three times a day (TID) | INTRAVENOUS | 0 refills | Status: AC

## 2021-08-09 MED ORDER — HYDROCODONE-ACETAMINOPHEN 5-325 MG PO TABS: 1.0000 | ORAL_TABLET | ORAL | 0 refills | Status: AC | PRN

## 2021-08-09 NOTE — Evaluation (Signed)
Physical Therapy Evaluation Patient Details Name: Krista Blake MRN: [id number] DOB: [birth year] Today's Date: 08/09/2021  History of Present Illness  Pt is a 55 y.o. female presenting to hospital 10/12 with acute onset of L knee pain s/p 2 separate I&D of L knee for underlying septic arthritis.  08/03/21 s/p arthroscopic I&D with bone biopsies x2 d/t acute septic arthritis with underlying DJD of L knee.  PMH includes htn; arthroscopic irrigation and debridement with extensive synovectomy, partial medial and lateral meniscectomies, and abrasion chondroplasty of grade III chondromalacia femoral trochlea, left knee Left 05/04/2021 (Dr. Roland Rack); and arthroscopic irrigation debridement with extensive debridement and abrasion chondroplasty of medial femoral condyle, lateral femoral condyle, and patella, left knee Left 06/07/2021 (Dr. Roland Rack). Patient is s/p  Arthroscopic irrigation and debridement of left knee performed 08/08/21 for septic arthritis  Clinical Impression  Re-evaluation performed as patient is now s/p  Arthroscopic irrigation and debridement of left knee performed 08/08/21 and is WBAT per new orders. Goals and care plan updated accordingly.  Patient ambulated around the nursing station with rolling walker and occasional cues for improved gait kinematics with carryover demonstrated. Patient reports pain level 5-6 in left knee that does not significantly worsen with activity. Reviewed exercises and importance of postioning of LLE to promote knee extension. Patient is very motivated to continue PT at discharged with outpatient PT recommended. PT will continue to follow to maximize independence in preparation for discharge home.       Recommendations for follow up therapy are one component of a multi-disciplinary discharge planning process, led by the attending physician.  Recommendations may be updated based on patient status, additional functional criteria and insurance  authorization.  Follow Up Recommendations Outpatient PT    Equipment Recommendations  Rolling walker with 5" wheels    Recommendations for Other Services       Precautions / Restrictions Precautions Precautions: Fall Restrictions Weight Bearing Restrictions: Yes LLE Weight Bearing: Weight bearing as tolerated      Mobility  Bed Mobility Overal bed mobility: Needs Assistance Bed Mobility: Supine to Sit;Sit to Supine     Supine to sit: Modified independent (Device/Increase time) Sit to supine: Min assist   General bed mobility comments: occasional assistance for LLE to return to bed. verbal cues for technique    Transfers Overall transfer level: Modified independent Equipment used: Rolling walker (2 wheeled) Transfers: Sit to/from Stand Sit to Stand: Modified independent (Device/Increase time)         General transfer comment: increased time required to complete tasks, no physical assistance reuqired  Ambulation/Gait Ambulation/Gait assistance: Supervision Gait Distance (Feet): 170 Feet Assistive device: Rolling walker (2 wheeled) Gait Pattern/deviations: Step-through pattern (decreased heel strike and increased left knee flexion with stance phase LLE) Gait velocity: mildly decreased   General Gait Details: verbal cues for improved gait kinematics with left heel strike and knee extension. patient demonstrated carry over of instruction. no dizziness reported with activity.  Stairs            Wheelchair Mobility    Modified Rankin (Stroke Patients Only)       Balance   Sitting-balance support: No upper extremity supported;Feet supported Sitting balance-Leahy Scale: Normal     Standing balance support: Single extremity supported Standing balance-Leahy Scale: Fair                               Pertinent Vitals/Pain Pain Assessment: 0-10 Pain Score: 5  Pain  Location: L knee Pain Descriptors / Indicators: Sore Pain Intervention(s):  Ice applied;Repositioned;Other (comment);Monitored during session;Limited activity within patient's tolerance (ice pack applied at end of session)    Home Living Family/patient expects to be discharged to:: Private residence Living Arrangements: Parent Available Help at Discharge: Family;Available 24 hours/day Type of Home: House Home Access: Stairs to enter Entrance Stairs-Rails: None Entrance Stairs-Number of Steps: 2 Home Layout: Two level;Able to live on main level with bedroom/bathroom   Additional Comments: patient was getting outpatient PT most recently    Prior Function Level of Independence: Independent with assistive device(s)         Comments: Pt was progressing from crutches to cane but requiring crutches recently d/t increased L knee pain     Hand Dominance        Extremity/Trunk Assessment   Upper Extremity Assessment Upper Extremity Assessment: Overall WFL for tasks assessed    Lower Extremity Assessment Lower Extremity Assessment:  (RLE WFL) LLE Deficits / Details: knee extension lacking 16 degrees to neutral. knee flexion at least 90 degrees observed with functional activity (did not formally meausre flexion). patient able to weight bear without knee buckling       Communication   Communication: No difficulties  Cognition Arousal/Alertness: Awake/alert Behavior During Therapy: WFL for tasks assessed/performed Overall Cognitive Status: Within Functional Limits for tasks assessed                                 General Comments: patient is cooperative and pleasent during session      General Comments      Exercises Total Joint Exercises Quad Sets: AROM;Left;Supine (x 3 reps) Goniometric ROM: left knee AROM 16- (approximately 80 degrees- flexion not formally measured with goniometer)   Assessment/Plan    PT Assessment Patient needs continued PT services  PT Problem List Decreased strength;Decreased range of motion;Decreased  activity tolerance;Decreased balance;Decreased mobility;Decreased knowledge of use of DME;Decreased knowledge of precautions;Pain;Decreased skin integrity       PT Treatment Interventions DME instruction;Gait training;Stair training;Functional mobility training;Therapeutic activities;Therapeutic exercise;Balance training;Patient/family education    PT Goals (Current goals can be found in the Care Plan section)  Acute Rehab PT Goals Patient Stated Goal: to go home PT Goal Formulation: With patient Time For Goal Achievement: 08/23/21 Potential to Achieve Goals: Good    Frequency 7X/week   Barriers to discharge        Co-evaluation               AM-PAC PT "6 Clicks" Mobility  Outcome Measure Help needed turning from your back to your side while in a flat bed without using bedrails?: None Help needed moving from lying on your back to sitting on the side of a flat bed without using bedrails?: None Help needed moving to and from a bed to a chair (including a wheelchair)?: None Help needed standing up from a chair using your arms (e.g., wheelchair or bedside chair)?: None Help needed to walk in hospital room?: A Little Help needed climbing 3-5 steps with a railing? : A Little 6 Click Score: 22    End of Session Equipment Utilized During Treatment: Gait belt Activity Tolerance: Patient tolerated treatment well Patient left: in bed;with call bell/phone within reach;with SCD's reapplied (ice pack applied to left knee, pillow roll under left heel to promote knee extension) Nurse Communication: Mobility status PT Visit Diagnosis: Other abnormalities of gait and mobility (R26.89);Muscle weakness (generalized) (  M62.81);Pain Pain - Right/Left: Left Pain - part of body: Knee    Time: 2820-6015 PT Time Calculation (min) (ACUTE ONLY): 52 min   Charges:   PT Evaluation $PT Re-evaluation: 1 Re-eval PT Treatments $Gait Training: 8-22 mins $Therapeutic Exercise: 8-22 mins        Minna Merritts, PT, MPT   Percell Locus 08/09/2021, 12:48 PM

## 2021-08-09 NOTE — TOC Progression Note (Signed)
Transition of Care [facility]) - Progression Note    Patient Details  Name: Krista Blake MRN: [id number] Date of Birth: 1966/03/10  Transition of Care [facility]) CM/SW [place], RN Phone Number: 08/09/2021, 4:53 PM  Clinical Narrative:    Spoke with the patient in the room, she will have [facility] for nursing, She will go to Outpatient for PT and PICC line in place, [facility] will provide the [facility]X, The anticipated DC date is tomorrow        Expected Discharge Plan and Services                                                 Social Determinants of Health (SDOH) Interventions    Readmission Risk Interventions No flowsheet data found.

## 2021-08-09 NOTE — Progress Notes (Signed)
PHARMACY CONSULT NOTE FOR:  OUTPATIENT  PARENTERAL ANTIBIOTIC THERAPY (OPAT)  Indication: recurrent MSSA septic knee and OM Regimen: Cefazolin 2gm IV q8h End date: 09/20/2021  IV antibiotic discharge orders are pended. To discharging provider:  please sign these orders via discharge navigator,  Select New Orders & click on the button choice - Manage This Unsigned Work.     Thank you for allowing pharmacy to be a part of this patient's care.  Doreene Eland, PharmD, BCPS.   Work Cell: [phone] 08/09/2021 11:38 AM

## 2021-08-09 NOTE — Progress Notes (Signed)
  Subjective: S/P POD1 repeat arthroscopic irrigation and debridement of the left knee. Patient reports pain as mild.   Patient is well, and has had no acute complaints or problems Plan is to go Home after hospital stay. Negative for chest pain and shortness of breath Fever: no Gastrointestinal:Negative for nausea and vomiting  Objective: Vital signs in last 24 hours: Temp:  [97 F (36.1 C)-98.6 F (37 C)] 98.6 F (37 C) (10/18 1257) Pulse Rate:  [60-86] 70 (10/18 1257) Resp:  [13-17] 16 (10/18 1257) BP: (113-142)/(58-85) 124/68 (10/18 1257) SpO2:  [93 %-100 %] 99 % (10/18 1257) Weight:  [103 kg] 103 kg (10/17 1514)  Intake/Output from previous day:  Intake/Output Summary (Last 24 hours) at 08/09/2021 1341 Last data filed at 08/08/2021 2040 Gross per 24 hour  Intake 300 ml  Output 25 ml  Net 275 ml    Intake/Output this shift: No intake/output data recorded.  Labs: Recent Labs    08/07/21 1330 08/09/21 0559  HGB 11.2* 11.2*   Recent Labs    08/07/21 1330 08/09/21 0559  WBC 6.4 5.7  RBC 3.80* 3.88  HCT 33.5* 33.9*  PLT 322 302   No results for input(s): NA, K, CL, CO2, BUN, CREATININE, GLUCOSE, CALCIUM in the last 72 hours. No results for input(s): LABPT, INR in the last 72 hours.   EXAM General - Patient is Alert and Appropriate Extremity - ABD soft Neurovascular intact Dorsiflexion/Plantar flexion intact Incision: dressing C/D/I No cellulitis present Compartment soft Dressing/Incision - clean, dry, no drainage, hemovac dressing with only mild bloody drainage noted. Motor Function - intact, moving foot and toes well on exam.  Abdomen soft with normal bowel sounds.  Past Medical History:  Diagnosis Date   Bulging lumbar disc    History of mammogram 03/04/2015   BIRAD 1   History of Papanicolaou smear of cervix 02/09/2016   NIL/NEG   Hypertension    Pap smear abnormality of cervix/human papillomavirus (HPV) positive 02/05/2015   NIL Pap with  positive HPV aptima.  HPV 16,18.45 neg   Screening for colon cancer 07/2018   neg Cologuard; repeat in 3 yrs    Assessment/Plan: 1 Day Post-Op Procedure(s) (LRB): ARTHROSCOPY KNEE (Left) Active Problems:   Septic arthritis of knee, left (HCC)  Estimated body mass index is 38.98 kg/m as calculated from the following:   Height as of this encounter: 5\' 4"  (1.626 m).   Weight as of this encounter: 103 kg. Advance diet Up with therapy D/C IV fluids when tolerating po intake.  Cultures demonstrated Staph aureus.   Current plan is for IV Cefazolin for 6 weeks at this time. Will remove hemovac tomorrow. Up with therapy today. Plan for discharge home tomorrow afternoon following hemovac removal.  DVT Prophylaxis - Lovenox Weight-Bearing as tolerated to left leg  J. Cameron Proud, PA-C [facility] 08/09/2021, 1:41 PM

## 2021-08-10 LAB — CBC
HCT: 34.9 % — ABNORMAL LOW (ref 36.0–46.0)
Hemoglobin: 11.5 g/dL — ABNORMAL LOW (ref 12.0–15.0)
MCH: 29.3 pg (ref 26.0–34.0)
MCHC: 33 g/dL (ref 30.0–36.0)
MCV: 88.8 fL (ref 80.0–100.0)
Platelets: 322 10*3/uL (ref 150–400)
RBC: 3.93 MIL/uL (ref 3.87–5.11)
RDW: 13.1 % (ref 11.5–15.5)
WBC: 6.3 10*3/uL (ref 4.0–10.5)
nRBC: 0 % (ref 0.0–0.2)

## 2021-08-10 MED ORDER — METHOCARBAMOL 750 MG PO TABS: 750.0000 mg | ORAL_TABLET | Freq: Three times a day (TID) | ORAL | 0 refills | Status: AC | PRN

## 2021-08-10 NOTE — Progress Notes (Signed)
Physical Therapy Treatment Patient Details Name: Krista Blake MRN: [id number] DOB: 06/08/1966 Today's Date: 08/10/2021   History of Present Illness Pt is a 55 y.o. female presenting to hospital 10/12 with acute onset of L knee pain s/p 2 separate I&D of L knee for underlying septic arthritis.  08/03/21 s/p arthroscopic I&D with bone biopsies x2 d/t acute septic arthritis with underlying DJD of L knee.  PMH includes htn; arthroscopic irrigation and debridement with extensive synovectomy, partial medial and lateral meniscectomies, and abrasion chondroplasty of grade III chondromalacia femoral trochlea, left knee Left 05/04/2021 (Dr. Roland Rack); and arthroscopic irrigation debridement with extensive debridement and abrasion chondroplasty of medial femoral condyle, lateral femoral condyle, and patella, left knee Left 06/07/2021 (Dr. Roland Rack). Patient is s/p  Arthroscopic irrigation and debridement of left knee performed 08/08/21 for septic arthritis    PT Comments    Pt was long sitting in bed upon arriving. Had drain removed earlier this AM. She is eager for DC to home later today after IV antibiotics. She was easily and safely able to exit bed, stand and ambulate without safety concerns. Reviewed importance of elevation, icing, and participation in ROM/strength exercises. Pt is planning to transition to outpatient PT at [facility] from a PT standpoint for safe DC home. Pt was in bed at conclusion of session with call bell in reach, and RN tech assisting pt with breakfast tray set up.      Recommendations for follow up therapy are one component of a multi-disciplinary discharge planning process, led by the attending physician.  Recommendations may be updated based on patient status, additional functional criteria and insurance authorization.  Follow Up Recommendations  Outpatient PT     Equipment Recommendations  Rolling walker with 5" wheels;3in1 (PT)       Precautions / Restrictions  Precautions Precautions: Fall Restrictions Weight Bearing Restrictions: Yes LLE Weight Bearing: Weight bearing as tolerated     Mobility  Bed Mobility Overal bed mobility: Modified Independent Bed Mobility: Supine to Sit;Sit to Supine     Supine to sit: Modified independent (Device/Increase time) Sit to supine: Modified independent (Device/Increase time)        Transfers Overall transfer level: Modified independent Equipment used: Rolling walker (2 wheeled) Transfers: Sit to/from Stand Sit to Stand: Modified independent (Device/Increase time)            Ambulation/Gait Ambulation/Gait assistance: Supervision Gait Distance (Feet): 200 Feet Assistive device: Rolling walker (2 wheeled) Gait Pattern/deviations: Step-through pattern;Antalgic Gait velocity: mildly decreased   General Gait Details: pt was easily and safely able to ambulate 200 ft with RW without LOB or safety concerns   Stairs Stairs: Yes       General stair comments: pt does not have stairs to navigate at home(staying at parents house). Was able to state proper tyechnique to perform stairs      Balance Overall balance assessment: Modified Independent Sitting-balance support: No upper extremity supported;Feet supported Sitting balance-Leahy Scale: Normal     Standing balance support: During functional activity;Bilateral upper extremity supported Standing balance-Leahy Scale: Good      Cognition Arousal/Alertness: Awake/alert Behavior During Therapy: WFL for tasks assessed/performed Overall Cognitive Status: Within Functional Limits for tasks assessed        General Comments: patient is cooperative and pleasent during session             Pertinent Vitals/Pain Pain Assessment: 0-10 Pain Score: 3  Pain Location: L knee Pain Descriptors / Indicators: Sore Pain Intervention(s): Limited activity within patient's tolerance;Monitored  during session;Premedicated before  session;Repositioned;Ice applied     PT Goals (current goals can now be found in the care plan section) Acute Rehab PT Goals Patient Stated Goal: to go home Progress towards PT goals: Progressing toward goals    Frequency    7X/week      PT Plan Current plan remains appropriate       AM-PAC PT "6 Clicks" Mobility   Outcome Measure  Help needed turning from your back to your side while in a flat bed without using bedrails?: None Help needed moving from lying on your back to sitting on the side of a flat bed without using bedrails?: None Help needed moving to and from a bed to a chair (including a wheelchair)?: None Help needed standing up from a chair using your arms (e.g., wheelchair or bedside chair)?: None Help needed to walk in hospital room?: A Little Help needed climbing 3-5 steps with a railing? : A Little 6 Click Score: 22    End of Session Equipment Utilized During Treatment: Gait belt Activity Tolerance: Patient tolerated treatment well Patient left: in bed;with call bell/phone within reach;with SCD's reapplied Nurse Communication: Mobility status PT Visit Diagnosis: Other abnormalities of gait and mobility (R26.89);Muscle weakness (generalized) (M62.81);Pain Pain - Right/Left: Left Pain - part of body: Knee     Time: 0815-0824 PT Time Calculation (min) (ACUTE ONLY): 9 min  Charges:  $Gait Training: 8-22 mins                     Julaine Fusi PTA 08/10/21, 8:36 AM

## 2021-08-10 NOTE — TOC Progression Note (Signed)
Transition of Care [facility]) - Progression Note    Patient Details  Name: Krista Blake MRN: [id number] Date of Birth: [birth year]  Transition of Care [facility]) CM/SW [place], RN Phone Number: 08/10/2021, 10:20 AM  Clinical Narrative:     Pam with [facility] notified me that the patient is scheduled to have the nurse come out to her home on Friday around 2 PM, She stated that the patient did not want reteaching and stated that she is proficient in the IV ABX infusion as she has done it for 6 weeks prior to this admission The patient will dose at home for the 10 PM dose and anticipates getting the 2 PM dose prior to DC, no additional needs at this time       Expected Discharge Plan and Services                                                 Social Determinants of Health (SDOH) Interventions    Readmission Risk Interventions No flowsheet data found.

## 2021-08-10 NOTE — Discharge Summary (Signed)
Physician Discharge Summary  Patient ID: Krista Blake MRN: [id number] DOB/AGE: [birth year] 53 y.o.  Admit date: 08/03/2021 Discharge date: 08/10/2021  Admission Diagnoses:  Septic arthritis of knee, left [facility]) [M00.9]  Discharge Diagnoses: Patient Active Problem List   Diagnosis Date Noted   Septic arthritis of knee, left ([place]) 06/07/2021   Pre-diabetes 11/09/2020   Elevated blood pressure reading without diagnosis of hypertension 08/05/2019   Essential hypertension 11/04/2014    Past Medical History:  Diagnosis Date   Bulging lumbar disc    History of mammogram 03/04/2015   BIRAD 1   History of Papanicolaou smear of cervix 02/09/2016   NIL/NEG   Hypertension    Pap smear abnormality of cervix/human papillomavirus (HPV) positive 02/05/2015   NIL Pap with positive HPV aptima.  HPV 16,18.45 neg   Screening for colon cancer 07/2018   neg Cologuard; repeat in 3 yrs     Transfusion: None.   Consultants (if any):  Adrian Prows, MD Infectious Disease  Discharged Condition: Improved  Hospital Course: Krista Blake is an 55 y.o. female who was admitted 08/03/2021 with a diagnosis of septic arthritis of the left knee with underlying osteomyelitis and went to the operating room on 08/08/2021 and underwent the above named procedures.  Patient underwent a left knee irritation and debridement with bone cultures on 08/03/21 and underwent a repeat irrigation and debridement on 08/08/21.  Culture did demonstrate staph aureus, patient was started on IV vancomycin initially before being placed on Cefazolin for discharge.  PICC Line was placed last week by nursing staff.   Surgeries: Procedure(s): ARTHROSCOPY KNEE on 08/08/2021 Patient tolerated the surgery well. Taken to PACU where she was stabilized and then transferred to the orthopedic floor.  Started on Lovenox 51m  q 24 hrs. Foot pumps applied bilaterally at 80 mm. Heels elevated on bed with rolled towels. No  evidence of DVT. Negative Homan. Physical therapy started on day #1 for gait training and transfers and has been working routinely with them.  Implants: None.  She was given perioperative antibiotics:  Anti-infectives (From admission, onward)    Start     Dose/Rate Route Frequency Ordered Stop   08/10/21 0000  ceFAZolin (ANCEF) IVPB        2 g Intravenous Every 8 hours 08/09/21 1403 09/20/21 2359   08/05/21 1500  ceFAZolin (ANCEF) IVPB 2g/100 mL premix        2 g 200 mL/hr over 30 Minutes Intravenous Every 8 hours 08/05/21 1348     08/05/21 0500  vancomycin (VANCOREADY) IVPB 750 mg/150 mL  Status:  Discontinued        750 mg 150 mL/hr over 60 Minutes Intravenous Every 12 hours 08/04/21 1538 08/04/21 1541   08/04/21 1700  vancomycin (VANCOREADY) IVPB 750 mg/150 mL        750 mg 150 mL/hr over 60 Minutes Intravenous Every 12 hours 08/04/21 1541 08/07/21 1902   08/04/21 1630  vancomycin (VANCOREADY) IVPB 1500 mg/300 mL  Status:  Discontinued        1,500 mg 150 mL/hr over 120 Minutes Intravenous  Once 08/04/21 1538 08/04/21 1541   08/04/21 1600  vancomycin (VANCOREADY) IVPB 750 mg/150 mL  Status:  Discontinued        750 mg 150 mL/hr over 60 Minutes Intravenous Every 12 hours 08/03/21 1840 08/04/21 1538   08/03/21 1900  vancomycin (VANCOCIN) IVPB 1000 mg/200 mL premix        1,000 mg 200 mL/hr over 60 Minutes Intravenous  Once 08/03/21 1804 08/03/21 1953   08/03/21 1529  vancomycin (VANCOCIN) powder  Status:  Discontinued          As needed 08/03/21 1529 08/03/21 1544   08/03/21 1340  vancomycin (VANCOCIN) 1-5 GM/200ML-% IVPB       Note to Pharmacy: Sylvester Harder   : cabinet override      08/03/21 1340 08/04/21 0144   08/03/21 1145  clindamycin (CLEOCIN) IVPB 900 mg  Status:  Discontinued        900 mg 100 mL/hr over 30 Minutes Intravenous On call to O.R. 08/03/21 1138 08/03/21 1712   08/03/21 1138  clindamycin (CLEOCIN) 900 MG/50ML IVPB       Note to Pharmacy: Maryagnes Amos   :  cabinet override      08/03/21 1138 08/03/21 2344     .  She was given sequential compression devices, early ambulation, and Lovenox for DVT prophylaxis.  She benefited maximally from the hospital stay and there were no complications.    Recent vital signs:  Vitals:   08/09/21 2004 08/10/21 0755  BP: 131/65 118/66  Pulse: 81 76  Resp: 17 14  Temp: 98.4 F (36.9 C) 98.1 F (36.7 C)  SpO2: 96% 95%    Recent laboratory studies:  Lab Results  Component Value Date   HGB 11.5 (L) 08/10/2021   HGB 11.2 (L) 08/09/2021   HGB 11.2 (L) 08/07/2021   Lab Results  Component Value Date   WBC 6.3 08/10/2021   PLT 322 08/10/2021   No results found for: INR Lab Results  Component Value Date   NA 141 08/06/2021   K 3.8 08/06/2021   CL 102 08/06/2021   CO2 32 08/06/2021   BUN 15 08/06/2021   CREATININE 0.65 08/06/2021   GLUCOSE 110 (H) 08/06/2021    Discharge Medications:   Allergies as of 08/10/2021       Reactions   Penicillins Hives   Reaction : Hives during college TOLERATED CEFAZOLIN AND AMOXICILLIN        Medication List     TAKE these medications    acetaminophen 650 MG CR tablet Commonly known as: TYLENOL Take 1,300 mg by mouth every 8 (eight) hours as needed for pain.   allopurinol 100 MG tablet Commonly known as: ZYLOPRIM Take 100 mg by mouth daily.   ceFAZolin  IVPB Commonly known as: ANCEF Inject 2 g into the vein every 8 (eight) hours. Indication:  recurrent MSSA septic knee and Osteomyelitis First Dose: Yes Last Day of Therapy:  09/20/2021 Labs - Once weekly:  CBC/D, SCr, CRP FAX weekly labs to [phone] Method of administration: IV Push Method of administration may be changed at the discretion of home infusion pharmacist based upon assessment of the patient and/or caregiver's ability to self-administer the medication ordered.   hydrochlorothiazide 25 MG tablet Commonly known as: HYDRODIURIL Take 25 mg by mouth daily.    HYDROcodone-acetaminophen 5-325 MG tablet Commonly known as: NORCO/VICODIN Take 1-2 tablets by mouth every 4 (four) hours as needed for moderate pain or severe pain.   Magnesium 250 MG Tabs Take 250 mg by mouth daily.   methocarbamol 750 MG tablet Commonly known as: ROBAXIN Take 1 tablet (750 mg total) by mouth every 8 (eight) hours as needed for muscle spasms.   ondansetron 4 MG tablet Commonly known as: ZOFRAN Take 1 tablet (4 mg total) by mouth every 6 (six) hours as needed for nausea.   PARoxetine 20 MG tablet Commonly known as: PAXIL  Take 20 mg by mouth daily.   propranolol 40 MG tablet Commonly known as: INDERAL Take 20 mg by mouth daily.   rosuvastatin 10 MG tablet Commonly known as: CRESTOR Take 10 mg by mouth daily.               Discharge Care Instructions  (From admission, onward)           Start     Ordered   08/09/21 0000  Change dressing on IV access line weekly and PRN  (Home infusion instructions - [facility] )        08/09/21 1403           Diagnostic Studies: MR KNEE LEFT WO CONTRAST  Result Date: 08/02/2021 CLINICAL DATA:  Staphylococcal arthritis of left knee. Left knee irrigation and debridement 05/04/2021 and 06/07/2021. EXAM: MRI OF THE LEFT KNEE WITHOUT CONTRAST TECHNIQUE: Multiplanar, multisequence MR imaging of the knee was performed. No intravenous contrast was administered. COMPARISON:  None. FINDINGS: MENISCI Medial meniscus: The medial meniscus is largely extruded peripherally from the joint, and the meniscal body is diffusely diminutive. These findings may be secondary to prior subtotal meniscectomy. No centrally displaced meniscal fragment identified. Lateral meniscus: Diminutive anterior horn which may be secondary to previous partial meniscectomy. The meniscal body is partially extruded peripherally from the joint. No centrally displaced meniscal fragment. LIGAMENTS Cruciates:  Intact. Collaterals:  Intact.  CARTILAGE Patellofemoral: Mild-to-moderate patellofemoral chondral thinning and surface irregularity, most notably in the medial trochlea. No significant subchondral edema or cortical destruction at the patellofemoral joint. Medial: Advanced chondral thinning, surface irregularity and diffuse subchondral edema. There are peripheral erosive changes within the medial femoral condyle and medial tibial plateau, suspicious for osteomyelitis. Lateral: Mild chondral thinning and surface irregularity with diffuse subchondral edema. There is a large erosion involving the lateral femoral condyle posteriorly. MISCELLANEOUS Joint:  Moderate size complex joint effusion. Popliteal Fossa: Complex fluid in the popliteus hiatus. The popliteus tendon is intact. No significant Baker's cyst. Extensor Mechanism:  Intact. Bones: As above, extensive subchondral edema with multiple erosions of the distal femur and proximal tibia, suspicious for septic arthritis and osteomyelitis. No evidence of acute fracture. Other: Mild generalized periarticular soft tissue edema without other focal fluid collection. IMPRESSION: 1. No relevant comparison studies available. 2. Moderate size complex knee joint effusion with diffuse subchondral edema and multiple erosions involving the distal femur and proximal tibia, consistent with known septic arthritis and osteomyelitis. 3. Suspected postsurgical changes involving both menisci as described above. Correlate with prior imaging and surgical procedures. 4. The knee ligaments appear intact. Electronically Signed   By: Richardean Sale M.D.   On: 08/02/2021 13:10   [place] EKG SITE RITE  Result Date: 08/04/2021 If Site Rite image not attached, placement could not be confirmed due to current cardiac rhythm.   Disposition: Discharge home today after 2pm dosing of Cefazolin.  Patient already has home health nursing scheduled to come out to her.  Follow-up appointment schedule for Monday 08/15/21.  Discharge  Instructions     [facility] pharmacist to adjust dose for Vancomycin, Aminoglycosides and other anti-infective therapies as requested by physician.   Complete by: As directed    [facility] to provide Cath Flo 23m   Complete by: As directed    Administer for PICC line occlusion and as ordered by physician for other access device issues.   Anaphylaxis Kit: Provided to treat any anaphylactic reaction to the medication being provided to the patient if  First Dose or when requested by physician   Complete by: As directed    Epinephrine 31m/ml vial / amp: Administer 0.336m(0.41m57msubcutaneously once for moderate to severe anaphylaxis, nurse to call physician and pharmacy when reaction occurs and call 911 if needed for immediate care   Diphenhydramine 56m42m IV vial: Administer 25-56mg67mIM PRN for first dose reaction, rash, itching, mild reaction, nurse to call physician and pharmacy when reaction occurs   Sodium Chloride 0.9% NS 500ml 14mAdminister if needed for hypovolemic blood pressure drop or as ordered by physician after call to physician with anaphylactic reaction   Change dressing on IV access line weekly and PRN   Complete by: As directed    Flush IV access with Sodium Chloride 0.9% and Heparin 10 units/ml or 100 units/ml   Complete by: As directed    Home infusion instructions - [facility]   Complete by: As directed    Instructions: Flush IV access with Sodium Chloride 0.9% and Heparin 10units/ml or 100units/ml   Change dressing on IV access line: Weekly and PRN   Instructions Cath Flo 2mg: A79mnister for PICC Line occlusion and as ordered by physician for other access device   [facility] pharmacist to adjust dose for: Vancomycin, Aminoglycosides and other anti-infective therapies as requested by physician   Method of administration may be changed at the discretion of home infusion pharmacist based upon assessment of the patient and/or  caregiver's ability to self-administer the medication ordered   Complete by: As directed         Follow-up Information     [facility] up.   Specialty: Infectious Diseases Why: November 8th at 1:00pm. Contact information: [phone]  Montie Gelardi,Lattie Corns [place]   Specialty: Physician Assistant Why: Follow-up next week for skin check. Contact information: [phone]         Signed: Jaquelynn Wanamaker LJudson Roch0/19/2022, 10:51 AM

## 2021-08-10 NOTE — Progress Notes (Signed)
  Subjective: S/P POD2 repeat arthroscopic irrigation and debridement of the left knee. Patient reports pain as mild.   Patient is well, and has had no acute complaints or problems Plan is to go Home after hospital stay. Negative for chest pain and shortness of breath Fever: no Gastrointestinal:Negative for nausea and vomiting Has been performing well with physical therapy.  Objective: Vital signs in last 24 hours: Temp:  [98.1 F (36.7 C)-98.6 F (37 C)] 98.1 F (36.7 C) (10/19 0755) Pulse Rate:  [70-81] 76 (10/19 0755) Resp:  [14-17] 14 (10/19 0755) BP: (118-131)/(65-68) 118/66 (10/19 0755) SpO2:  [95 %-99 %] 95 % (10/19 0755)  Intake/Output from previous day:  Intake/Output Summary (Last 24 hours) at 08/10/2021 1048 Last data filed at 08/10/2021 0700 Gross per 24 hour  Intake --  Output 60 ml  Net -60 ml    Intake/Output this shift: No intake/output data recorded.  Labs: Recent Labs    08/07/21 1330 08/09/21 0559 08/10/21 0713  HGB 11.2* 11.2* 11.5*   Recent Labs    08/09/21 0559 08/10/21 0713  WBC 5.7 6.3  RBC 3.88 3.93  HCT 33.9* 34.9*  PLT 302 322   No results for input(s): NA, K, CL, CO2, BUN, CREATININE, GLUCOSE, CALCIUM in the last 72 hours. No results for input(s): LABPT, INR in the last 72 hours.   EXAM General - Patient is Alert and Appropriate Extremity - ABD soft Neurovascular intact Dorsiflexion/Plantar flexion intact Incision: dressing C/D/I No cellulitis present Compartment soft Dressing/Incision - clean, dry, no drainage, hemovac dressing with only mild bloody drainage noted.  Hemovac was removed at today's visit.  New bulky dressing was applied to the left knee. Motor Function - intact, moving foot and toes well on exam.  Abdomen soft with normal bowel sounds.  Past Medical History:  Diagnosis Date   Bulging lumbar disc    History of mammogram 03/04/2015   BIRAD 1   History of Papanicolaou smear of cervix 02/09/2016   NIL/NEG    Hypertension    Pap smear abnormality of cervix/human papillomavirus (HPV) positive 02/05/2015   NIL Pap with positive HPV aptima.  HPV 16,18.45 neg   Screening for colon cancer 07/2018   neg Cologuard; repeat in 3 yrs    Assessment/Plan: 2 Days Post-Op Procedure(s) (LRB): ARTHROSCOPY KNEE (Left) Active Problems:   Septic arthritis of knee, left ([facility])  Estimated body mass index is 38.98 kg/m as calculated from the following:   Height as of this encounter: 5\' 4"  (1.626 m).   Weight as of this encounter: 103 kg. Advance diet Up with therapy D/C IV fluids when tolerating po intake.  Cultures demonstrated Staph aureus.   Current plan is for IV Cefazolin for 6 weeks at this time. Hemovac was removed today. Up with therapy today. Plan will be for discharge home today after her 2 pm dose of antibiotics. Patient is already scheduled for a follow-up appointment with Dr. Roland Rack on Monday, keep appointment for skin check.  DVT Prophylaxis - Lovenox Weight-Bearing as tolerated to left leg  J. Cameron Proud, PA-C [facility] 08/10/2021, 10:48 AM

## 2021-08-10 NOTE — Plan of Care (Signed)

## 2021-09-06 ENCOUNTER — Ambulatory Visit: Payer: BC Managed Care – PPO | Admitting: Dermatology

## 2021-09-20 ENCOUNTER — Other Ambulatory Visit: Payer: Self-pay | Admitting: Infectious Diseases

## 2021-09-20 DIAGNOSIS — M00062 Staphylococcal arthritis, left knee: Secondary | ICD-10-CM

## 2021-09-26 ENCOUNTER — Ambulatory Visit: Payer: BC Managed Care – PPO | Admitting: Dermatology

## 2021-10-10 ENCOUNTER — Ambulatory Visit
Admission: RE | Admit: 2021-10-10 | Discharge: 2021-10-10 | Disposition: A | Discharge status: Home / Self Care | Payer: BC Managed Care – PPO | Source: Ambulatory Visit | Attending: Infectious Diseases | Admitting: Infectious Diseases

## 2021-10-10 DIAGNOSIS — M00062 Staphylococcal arthritis, left knee: Secondary | ICD-10-CM | POA: Insufficient documentation

## 2021-10-10 IMAGING — MR MR KNEE*L* W/O CM
7 series · 40 of 40 positions shown · non-contrast
Comparison: MR knee [DATE].

CLINICAL DATA: Left anterior/lateral knee pain. History of Staph
infection of left knee following surgery in [REDACTED].

EXAM:
MRI OF THE LEFT KNEE WITHOUT CONTRAST
TECHNIQUE: Multiplanar, multisequence MR imaging of the knee was performed. No
intravenous contrast was administered.

[Series 8: T2 fat-sat · axial · left · 4.0mm · 0.50mm/px · z∈[-64,+61]mm · 5 of 26 slices shown (1 of 3)]
[im 1/26]
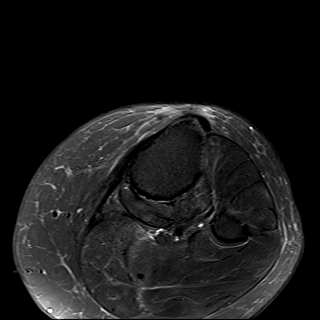
[im 7/26]
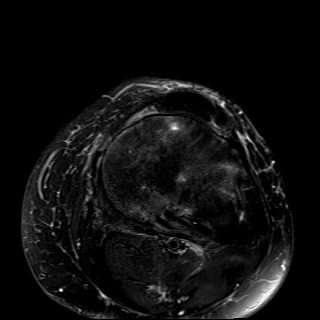
[im 13/26]
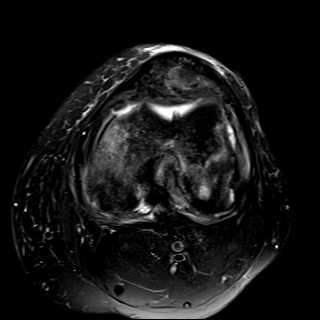
[im 19/26]
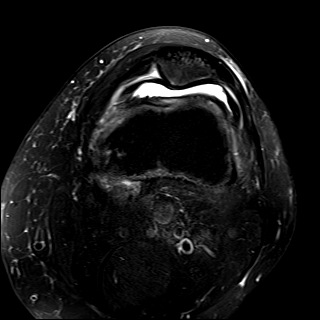
[im 26/26]
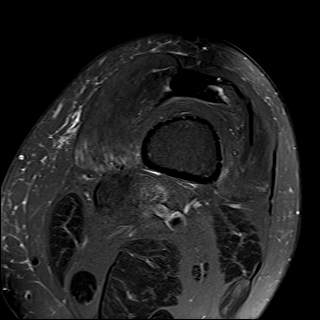

[Series 9: T1 · coronal · left · 4.0mm · 0.47mm/px · 6 of 32 slices shown]
[im 1/32]
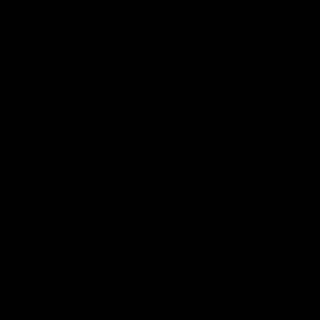
[im 7/32]
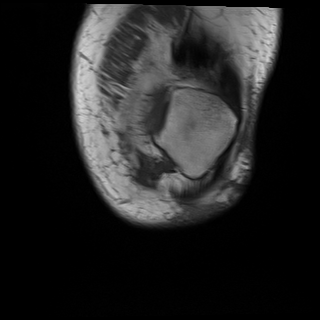
[im 13/32]
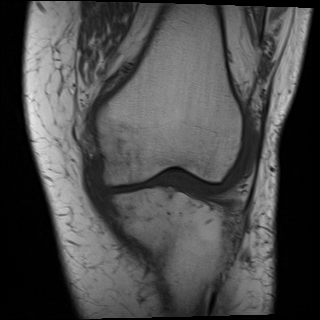
[im 19/32]
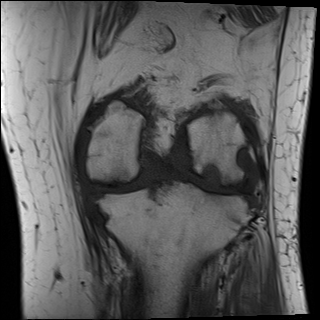
[im 25/32]
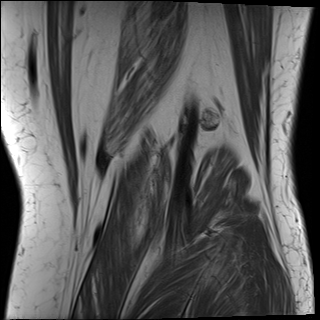
[im 32/32]
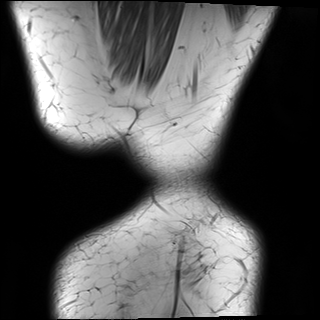

[Series 10: T2 fat-sat · coronal · left · 4.0mm · 0.47mm/px · 6 of 32 slices shown (2 of 3)]
[im 1/32]
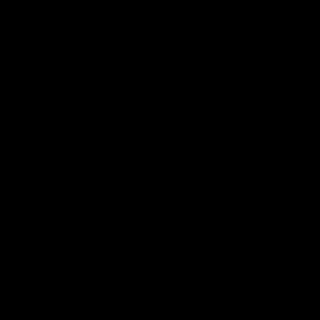
[im 7/32]
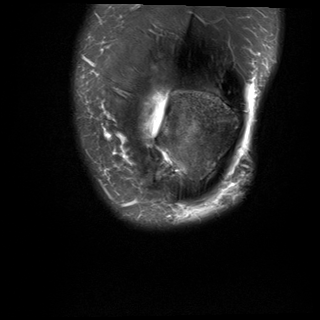
[im 13/32]
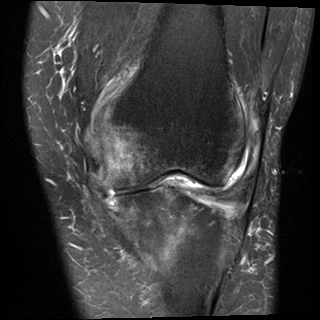
[im 19/32]
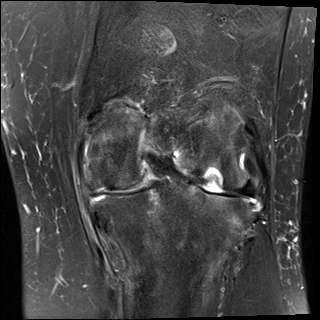
[im 25/32]
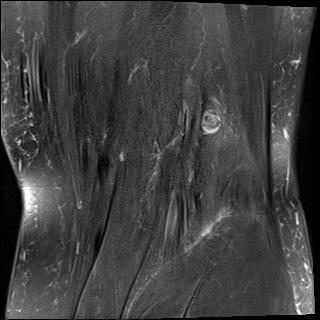
[im 32/32]
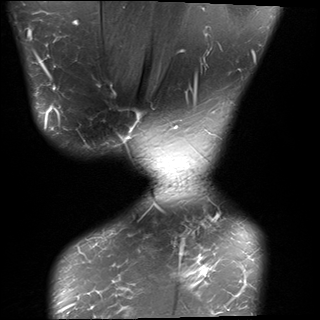

[Series 11: PD fat-sat · coronal · left · 4.0mm · 0.59mm/px · 6 of 32 slices shown (1 of 2)]
[im 1/32]
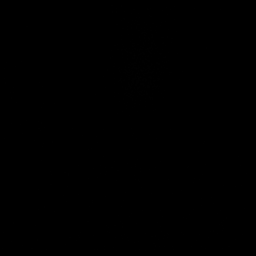
[im 7/32]
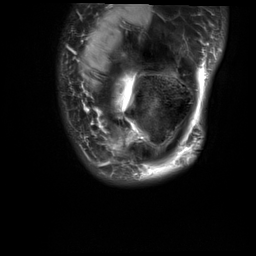
[im 13/32]
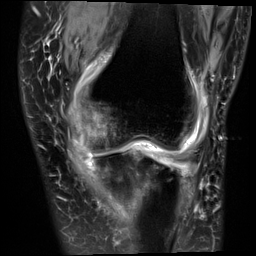
[im 19/32]
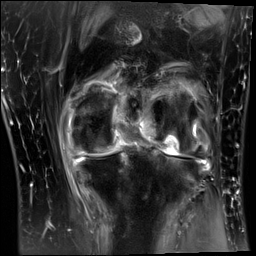
[im 25/32]
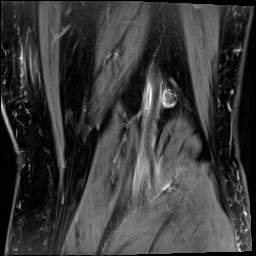
[im 32/32]
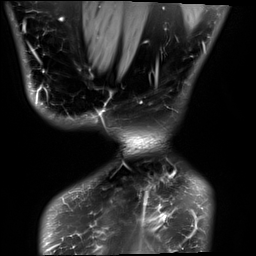

[Series 12: PD fat-sat · sagittal · left · 3.0mm · 0.47mm/px · 7 of 35 slices shown (2 of 2)]
[im 1/35]
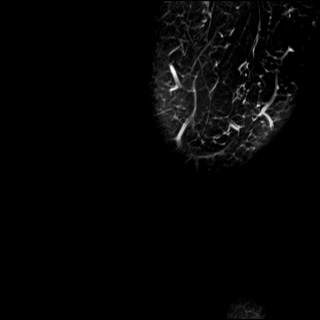
[im 6/35]
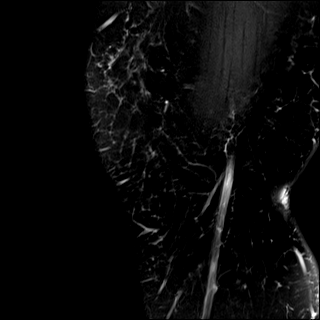
[im 12/35]
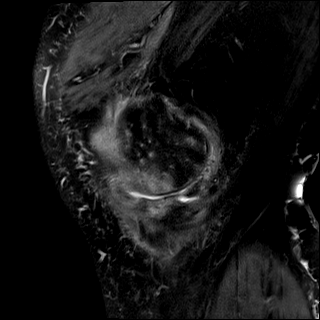
[im 18/35]
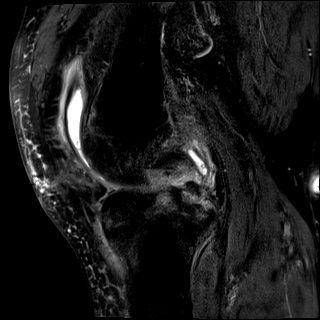
[im 23/35]
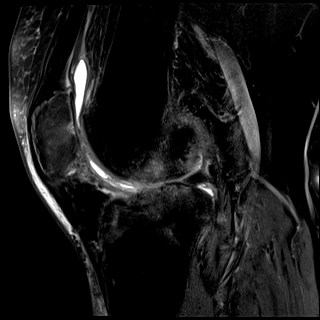
[im 29/35]
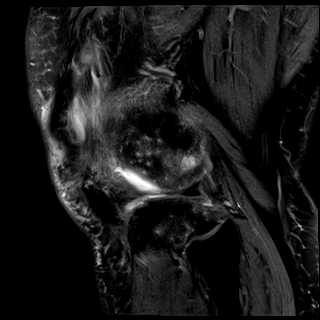
[im 35/35]
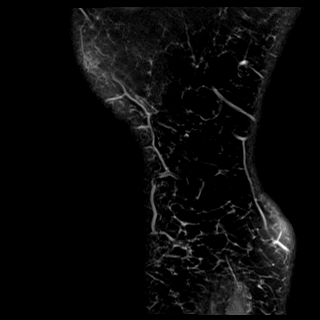

[Series 13: T2 fat-sat · sagittal · left · 3.0mm · 0.47mm/px · 7 of 36 slices shown (3 of 3)]
[im 1/36]
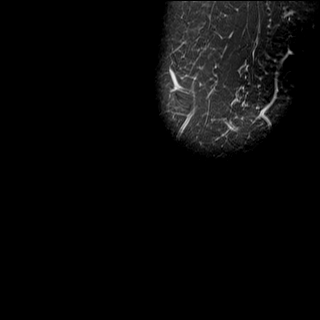
[im 6/36]
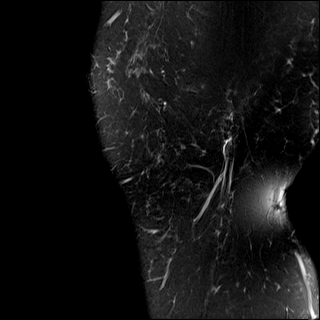
[im 12/36]
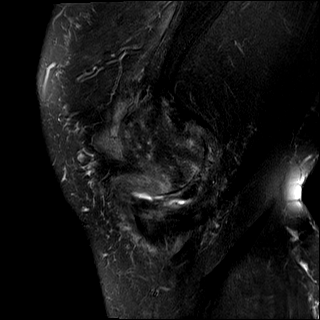
[im 18/36]
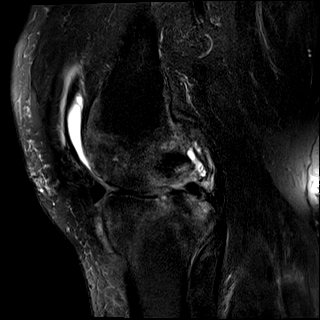
[im 24/36]
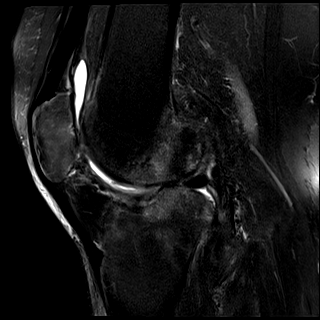
[im 30/36]
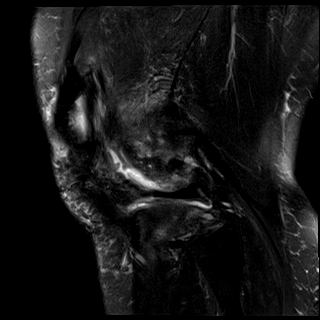
[im 36/36]
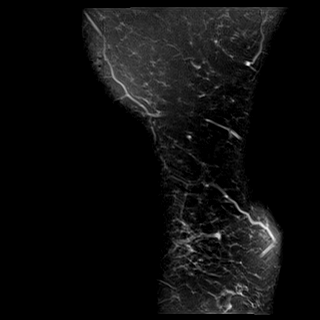

[Series 14: PD · coronal · left · 2.0mm · 0.47mm/px · 3 of 16 slices shown]
[im 1/16]
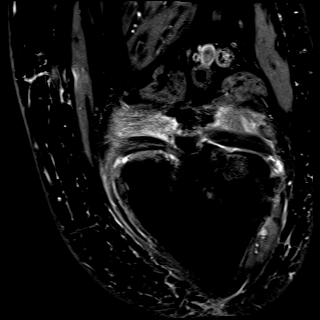
[im 8/16]
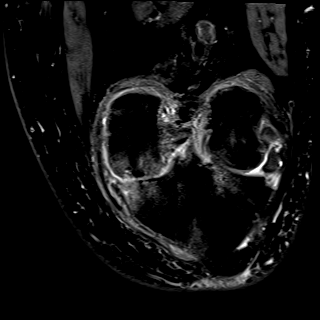
[im 16/16]
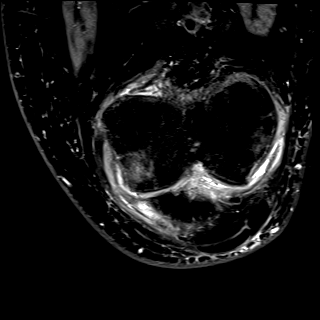

[40 of 40 positions shown; findings below may reference images not displayed]

FINDINGS: MENISCI

Medial: Macerated appearance of the medial meniscal body with
intrasubstance degeneration in the anterior and posterior horns.
This is similar in appearance to prior exam. Meniscal body findings
could potentially be related to prior meniscectomy.

Lateral: Macerated appearance of the anterior horn of the lateral
meniscus with intrasubstance degenerative signal throughout the body
and posterior horn. Anterior horn findings could potentially be
related to prior meniscectomy.

LIGAMENTS

Cruciates: ACL and PCL are intact.

Collaterals: Mild periligamentous edema along the medial collateral
ligament, which is otherwise intact. Lateral collateral ligament
complex is intact.

CARTILAGE

Patellofemoral: Mild-to-moderate chondrosis, worst along the medial
trochlea.

Medial: Advanced, full-thickness cartilage loss, surface
irregularity and diffuse subchondral marrow edema. Similar
peripheral erosive changes along the medial femoral condyle and
medial tibial plateau.

Lateral: Overall mild-to-moderate chondrosis, with large erosion
involving the posterior weight-bearing lateral femoral condyle which
is similar in extent to the prior exam.

JOINT: Small joint effusion, decreased from the prior exam.

POPLITEAL FOSSA: No Baker's cyst.

EXTENSOR MECHANISM: Intact quadriceps tendon. Intact patellar
tendon.

BONES: Extensive periarticular marrow edema in the femoral condyles
and tibial plateaus, though slightly improved since the prior exam.

Other: No additional findings.
IMPRESSION: 1. Findings of septic arthritis and osteomyelitis of the knee, with
persistent but slightly decreased periarticular marrow edema and
decreased, now small joint effusion in comparison to prior exam.
2. Unchanged macerated appearance of the medial meniscal body and
anterior horn of the lateral meniscus. These findings could be
potentially related to prior meniscectomy versus complex tearing,
correlate with history.

## 2021-10-25 ENCOUNTER — Telehealth: Payer: Self-pay

## 2021-10-25 NOTE — Telephone Encounter (Signed)
Called pt to f/u on Cologuard test, is she still planning to complete it? Pt says yes but has had couple of surgeries in the past year so prefers to wait. Says she was going to discuss this with Elmo Putt at her upcoming annual appt.

## 2021-11-13 ENCOUNTER — Inpatient Hospital Stay
Admission: EM | Admit: 2021-11-13 | Discharge: 2021-11-15 | DRG: 176 | Disposition: A | Discharge status: Home / Self Care | Payer: BC Managed Care – PPO | Attending: Student | Admitting: Student

## 2021-11-13 ENCOUNTER — Other Ambulatory Visit: Payer: Self-pay

## 2021-11-13 ENCOUNTER — Encounter: Payer: Self-pay | Admitting: Emergency Medicine

## 2021-11-13 ENCOUNTER — Emergency Department: Payer: BC Managed Care – PPO

## 2021-11-13 DIAGNOSIS — K760 Fatty (change of) liver, not elsewhere classified: Secondary | ICD-10-CM | POA: Diagnosis present

## 2021-11-13 DIAGNOSIS — D696 Thrombocytopenia, unspecified: Secondary | ICD-10-CM | POA: Diagnosis present

## 2021-11-13 DIAGNOSIS — Z880 Allergy status to penicillin: Secondary | ICD-10-CM | POA: Diagnosis not present

## 2021-11-13 DIAGNOSIS — M25579 Pain in unspecified ankle and joints of unspecified foot: Secondary | ICD-10-CM

## 2021-11-13 DIAGNOSIS — F32A Depression, unspecified: Secondary | ICD-10-CM | POA: Diagnosis present

## 2021-11-13 DIAGNOSIS — Z20822 Contact with and (suspected) exposure to covid-19: Secondary | ICD-10-CM | POA: Diagnosis present

## 2021-11-13 DIAGNOSIS — Z6841 Body Mass Index (BMI) 40.0 and over, adult: Secondary | ICD-10-CM

## 2021-11-13 DIAGNOSIS — E876 Hypokalemia: Secondary | ICD-10-CM | POA: Diagnosis present

## 2021-11-13 DIAGNOSIS — E871 Hypo-osmolality and hyponatremia: Secondary | ICD-10-CM | POA: Diagnosis present

## 2021-11-13 DIAGNOSIS — F419 Anxiety disorder, unspecified: Secondary | ICD-10-CM | POA: Diagnosis present

## 2021-11-13 DIAGNOSIS — I2699 Other pulmonary embolism without acute cor pulmonale: Secondary | ICD-10-CM | POA: Diagnosis not present

## 2021-11-13 DIAGNOSIS — I82432 Acute embolism and thrombosis of left popliteal vein: Secondary | ICD-10-CM | POA: Diagnosis present

## 2021-11-13 DIAGNOSIS — R0789 Other chest pain: Secondary | ICD-10-CM | POA: Diagnosis present

## 2021-11-13 DIAGNOSIS — Z8249 Family history of ischemic heart disease and other diseases of the circulatory system: Secondary | ICD-10-CM

## 2021-11-13 DIAGNOSIS — I10 Essential (primary) hypertension: Secondary | ICD-10-CM | POA: Diagnosis present

## 2021-11-13 DIAGNOSIS — M00062 Staphylococcal arthritis, left knee: Secondary | ICD-10-CM

## 2021-11-13 DIAGNOSIS — I2692 Saddle embolus of pulmonary artery without acute cor pulmonale: Secondary | ICD-10-CM | POA: Diagnosis present

## 2021-11-13 DIAGNOSIS — Z8619 Personal history of other infectious and parasitic diseases: Secondary | ICD-10-CM | POA: Diagnosis not present

## 2021-11-13 DIAGNOSIS — M009 Pyogenic arthritis, unspecified: Secondary | ICD-10-CM | POA: Diagnosis present

## 2021-11-13 DIAGNOSIS — T502X5A Adverse effect of carbonic-anhydrase inhibitors, benzothiadiazides and other diuretics, initial encounter: Secondary | ICD-10-CM | POA: Diagnosis present

## 2021-11-13 DIAGNOSIS — M25572 Pain in left ankle and joints of left foot: Secondary | ICD-10-CM

## 2021-11-13 DIAGNOSIS — I82402 Acute embolism and thrombosis of unspecified deep veins of left lower extremity: Secondary | ICD-10-CM | POA: Diagnosis not present

## 2021-11-13 DIAGNOSIS — E669 Obesity, unspecified: Secondary | ICD-10-CM | POA: Diagnosis not present

## 2021-11-13 DIAGNOSIS — Z79899 Other long term (current) drug therapy: Secondary | ICD-10-CM

## 2021-11-13 DIAGNOSIS — I824Y2 Acute embolism and thrombosis of unspecified deep veins of left proximal lower extremity: Secondary | ICD-10-CM

## 2021-11-13 LAB — CBC WITH DIFFERENTIAL/PLATELET
Abs Immature Granulocytes: 0.05 10*3/uL (ref 0.00–0.07)
Basophils Absolute: 0 10*3/uL (ref 0.0–0.1)
Basophils Relative: 0 %
Eosinophils Absolute: 0.1 10*3/uL (ref 0.0–0.5)
Eosinophils Relative: 1 %
HCT: 38.6 % (ref 36.0–46.0)
Hemoglobin: 13.1 g/dL (ref 12.0–15.0)
Immature Granulocytes: 1 %
Lymphocytes Relative: 18 %
Lymphs Abs: 1.7 10*3/uL (ref 0.7–4.0)
MCH: 28.8 pg (ref 26.0–34.0)
MCHC: 33.9 g/dL (ref 30.0–36.0)
MCV: 84.8 fL (ref 80.0–100.0)
Monocytes Absolute: 1 10*3/uL (ref 0.1–1.0)
Monocytes Relative: 11 %
Neutro Abs: 6.5 10*3/uL (ref 1.7–7.7)
Neutrophils Relative %: 69 %
Platelets: 153 10*3/uL (ref 150–400)
RBC: 4.55 MIL/uL (ref 3.87–5.11)
RDW: 13.2 % (ref 11.5–15.5)
WBC: 9.5 10*3/uL (ref 4.0–10.5)
nRBC: 0 % (ref 0.0–0.2)

## 2021-11-13 LAB — TROPONIN I (HIGH SENSITIVITY)
Troponin I (High Sensitivity): 5 ng/L (ref <18)
Troponin I (High Sensitivity): 5 ng/L (ref <18)

## 2021-11-13 LAB — BASIC METABOLIC PANEL
Anion gap: 11 (ref 5–15)
BUN: 12 mg/dL (ref 6–20)
CO2: 27 mmol/L (ref 22–32)
Calcium: 9.1 mg/dL (ref 8.9–10.3)
Chloride: 96 mmol/L — ABNORMAL LOW (ref 98–111)
Creatinine, Ser: 0.64 mg/dL (ref 0.44–1.00)
GFR, Estimated: >60 mL/min (ref >60)
Glucose, Bld: 148 mg/dL — ABNORMAL HIGH (ref 70–99)
Potassium: 3.4 mmol/L — ABNORMAL LOW (ref 3.5–5.1)
Sodium: 134 mmol/L — ABNORMAL LOW (ref 135–145)

## 2021-11-13 LAB — RESP PANEL BY RT-PCR (FLU A&B, COVID) ARPGX2
Influenza A by PCR: NEGATIVE
Influenza B by PCR: NEGATIVE
SARS Coronavirus 2 by RT PCR: NEGATIVE

## 2021-11-13 LAB — BRAIN NATRIURETIC PEPTIDE: B Natriuretic Peptide: 49.9 pg/mL (ref 0.0–100.0)

## 2021-11-13 IMAGING — CT CT ANGIO CHEST
2 of 7 series · 17 of 46 positions shown · IV contrast (APPLIED)
Comparison: None.

CLINICAL DATA: PE suspected, left calf pain and swelling

EXAM:
CT ANGIOGRAPHY CHEST WITH CONTRAST
TECHNIQUE: Multidetector CT imaging of the chest was performed using the
standard protocol during bolus administration of intravenous
contrast. Multiplanar CT image reconstructions and MIPs were
obtained to evaluate the vascular anatomy.

[Series 5: thins · axial · 0.71mm/px · z∈[-262,+7]mm · 14 of 374 slices shown]
[im 19/374  lung]
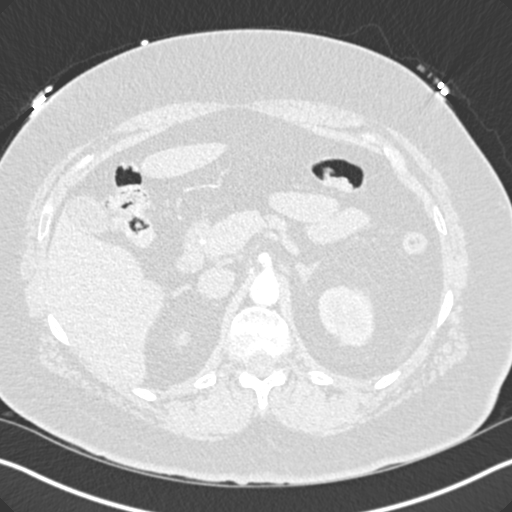
[im 56/374  soft-tissue]
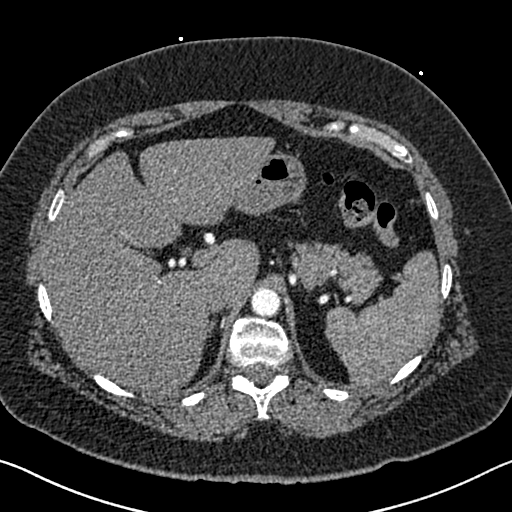
[im 75/374  lung]
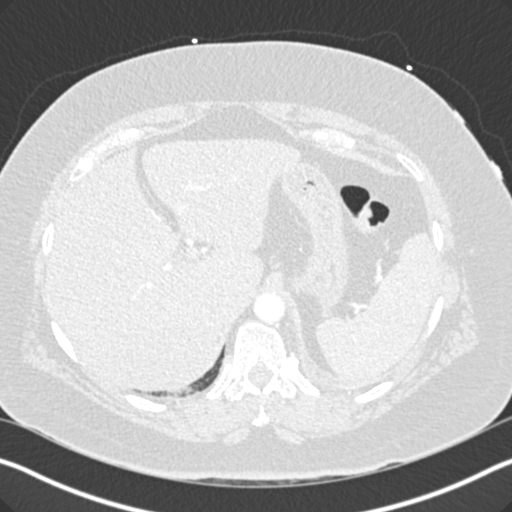
[im 94/374  soft-tissue]
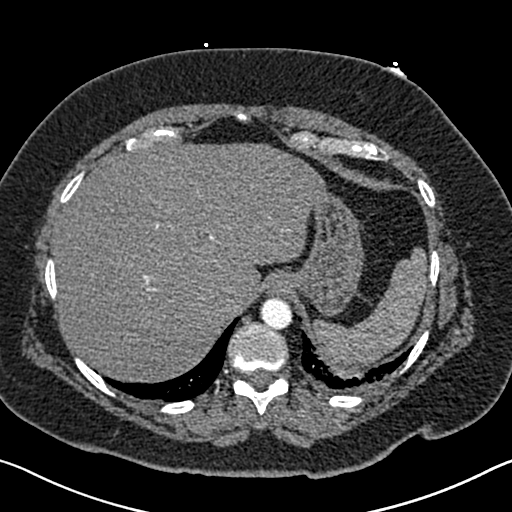
[im 131/374  lung]
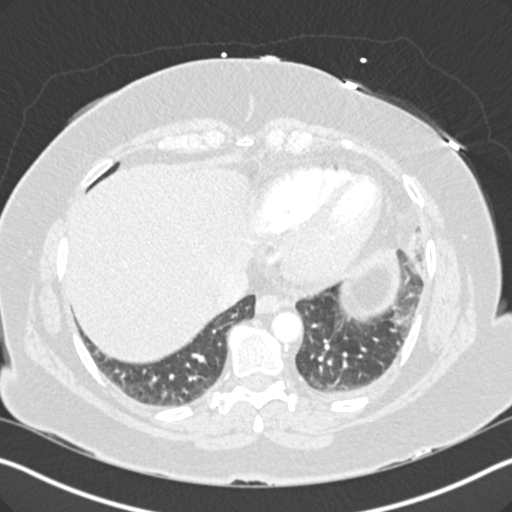
[im 150/374  soft-tissue]
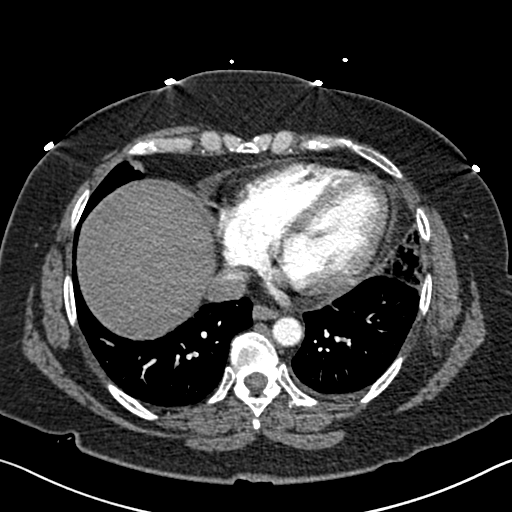
[im 168/374  lung]
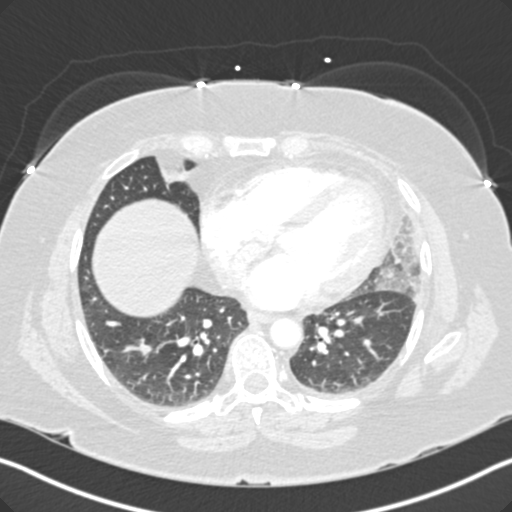
[im 206/374  soft-tissue]
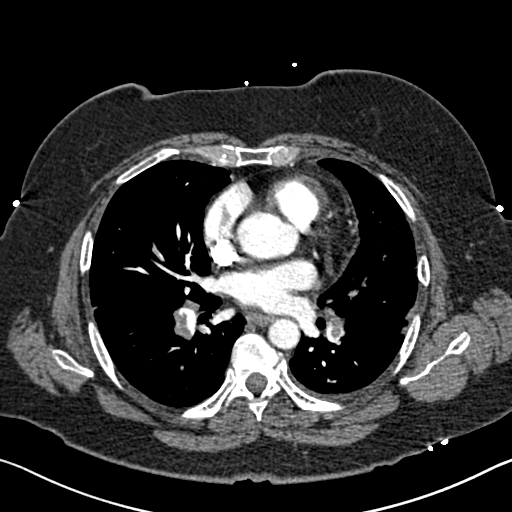
[im 224/374  lung]
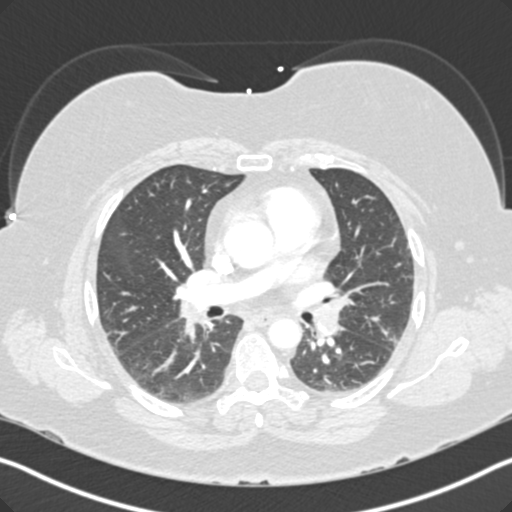
[im 243/374  soft-tissue]
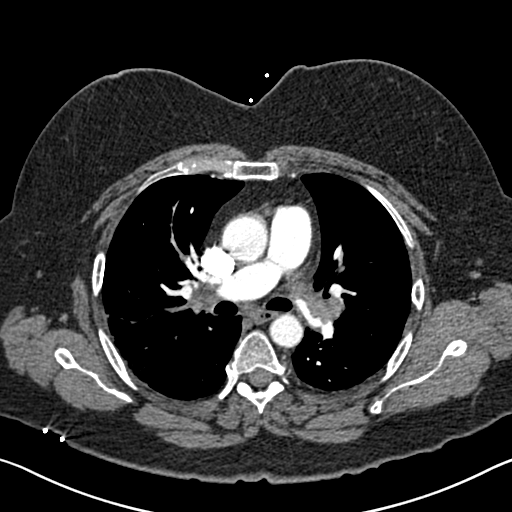
[im 280/374  lung]
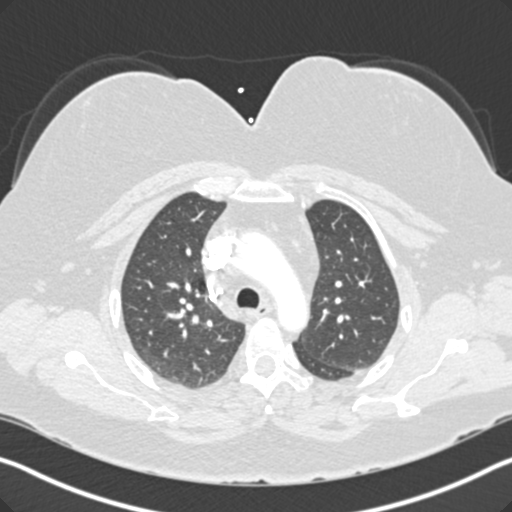
[im 299/374  soft-tissue]
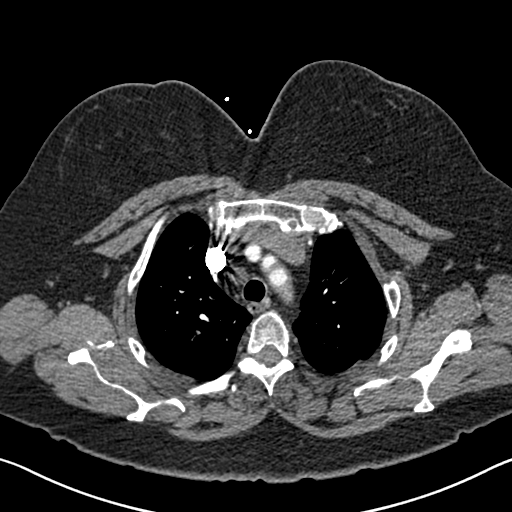
[im 318/374  lung]
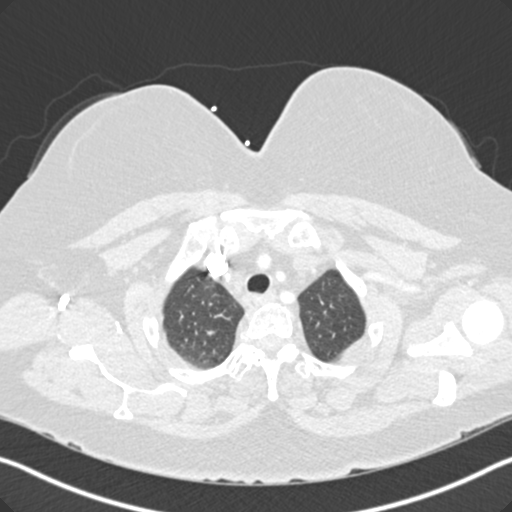
[im 355/374  soft-tissue]
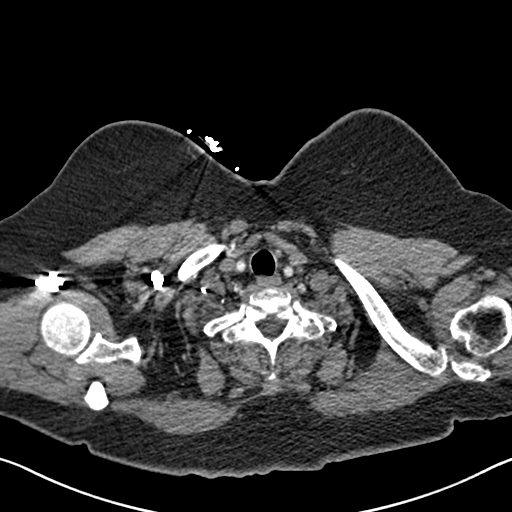

[Series 7: coronal mpr · coronal · 0.58mm/px · 3 of 106 slices shown]
[im 27/106  soft-tissue]
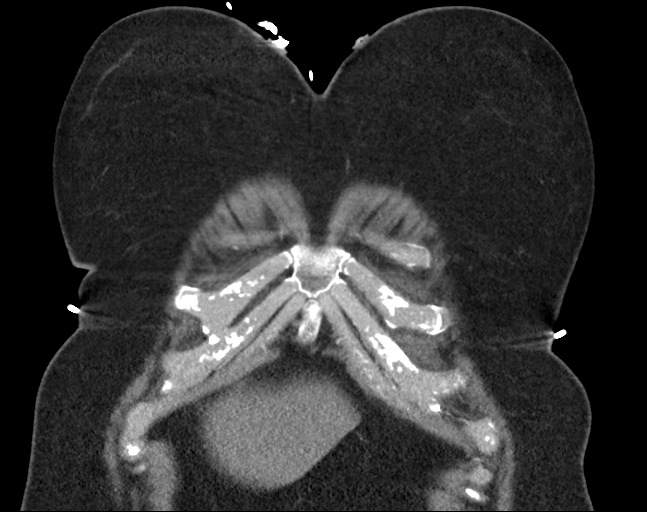
[im 53/106  soft-tissue]
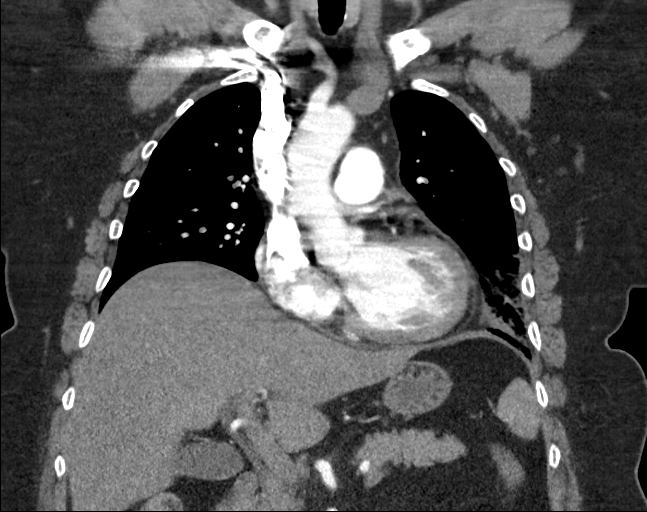
[im 79/106  soft-tissue]
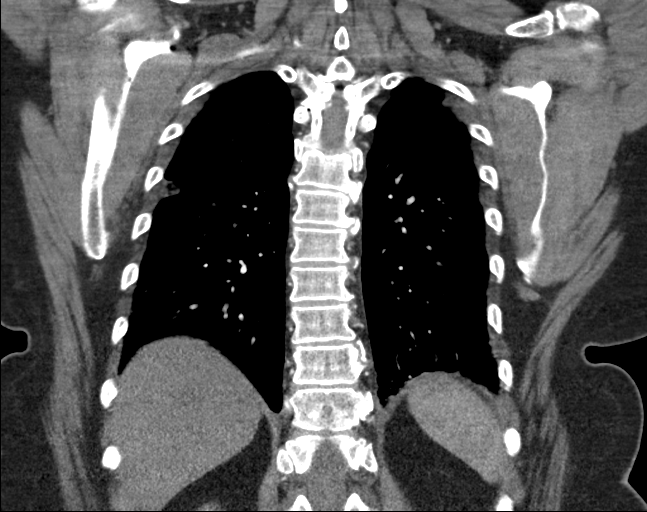

[17 of 46 positions shown; findings below may reference images not displayed]

RADIATION DOSE REDUCTION: This exam was performed according to the
departmental dose-optimization program which includes automated
exposure control, adjustment of the mA and/or kV according to
patient size and/or use of iterative reconstruction technique.

CONTRAST:  75mL OMNIPAQUE IOHEXOL 350 MG/ML SOLN
FINDINGS: Cardiovascular: Positive examination for pulmonary embolism, with a
large burden of embolus present in the left and right pulmonary
arteries as well as in the lobar and segmental arteries bilaterally.
Mild, global cardiomegaly. RV LV ratio is preserved, less than 0.9.
Main pulmonary artery is normal in caliber. No pericardial effusion.

Mediastinum/Nodes: No enlarged mediastinal, hilar, or axillary lymph
nodes. Thyroid gland, trachea, and esophagus demonstrate no
significant findings.

Lungs/Pleura: Multiple subpleural ground-glass airspace opacities
with some evidence of central clearing, most conspicuously in the
lingula (series 6, image 52) and posterior right upper lobe (series
6, image 32). No pleural effusion or pneumothorax.

Upper Abdomen: No acute abnormality.  Hepatic steatosis.

Musculoskeletal: No chest wall abnormality. No acute osseous
findings.

Review of the MIP images confirms the above findings.
IMPRESSION: 1. Positive examination for pulmonary embolism, with a large burden
of embolus present in the left and right pulmonary arteries as well
as in the lobar and segmental arteries bilaterally.
2. Mild, global cardiomegaly. RV LV ratio is preserved, less than
0.9. Main pulmonary artery is normal in caliber.
3. Multiple bilateral subpleural ground-glass airspace opacities
with some evidence of central clearing, most conspicuously in the
lingula and posterior right upper lobe. Findings are consistent with
multifocal pulmonary infarction in the setting of pulmonary
embolism.
4. Hepatic steatosis.

These results were called by telephone at the time of interpretation
on [DATE] at [DATE] to Dr. APPLEGATE , who verbally
acknowledged these results.

## 2021-11-13 MED ORDER — HYDROMORPHONE HCL 1 MG/ML IJ SOLN
1.0000 mg | Freq: Once | INTRAMUSCULAR | Status: AC
  Administered 2021-11-13: 1 mg via INTRAVENOUS
  Filled 2021-11-13: qty 1

## 2021-11-13 MED ORDER — HYDROMORPHONE HCL 1 MG/ML IJ SOLN
INTRAMUSCULAR | Status: AC
  Administered 2021-11-13: 1 mg via INTRAVENOUS
  Filled 2021-11-13: qty 1

## 2021-11-13 MED ORDER — CEPHALEXIN 500 MG PO CAPS
500.0000 mg | ORAL_CAPSULE | Freq: Four times a day (QID) | ORAL | Status: DC
  Administered 2021-11-13 – 2021-11-15 (×7): 500 mg via ORAL
  Filled 2021-11-13 (×7): qty 1

## 2021-11-13 MED ORDER — SODIUM CHLORIDE 0.9% FLUSH
3.0000 mL | Freq: Two times a day (BID) | INTRAVENOUS | Status: DC
  Administered 2021-11-13 – 2021-11-15 (×4): 3 mL via INTRAVENOUS

## 2021-11-13 MED ORDER — HYDROMORPHONE HCL 1 MG/ML IJ SOLN
1.0000 mg | INTRAMUSCULAR | Status: DC | PRN
Start: 2021-11-13 — End: 2021-11-14
  Administered 2021-11-13 – 2021-11-14 (×3): 1 mg via INTRAVENOUS
  Filled 2021-11-13 (×3): qty 1

## 2021-11-13 MED ORDER — ALLOPURINOL 100 MG PO TABS
100.0000 mg | ORAL_TABLET | Freq: Every day | ORAL | Status: DC
  Administered 2021-11-14 – 2021-11-15 (×2): 100 mg via ORAL
  Filled 2021-11-13 (×2): qty 1

## 2021-11-13 MED ORDER — MAGNESIUM OXIDE -MG SUPPLEMENT 400 (240 MG) MG PO TABS
400.0000 mg | ORAL_TABLET | Freq: Every day | ORAL | Status: DC
  Administered 2021-11-14 – 2021-11-15 (×2): 400 mg via ORAL
  Filled 2021-11-13 (×2): qty 1

## 2021-11-13 MED ORDER — ROSUVASTATIN CALCIUM 10 MG PO TABS
10.0000 mg | ORAL_TABLET | Freq: Every day | ORAL | Status: DC
  Administered 2021-11-14 – 2021-11-15 (×2): 10 mg via ORAL
  Filled 2021-11-13 (×2): qty 1

## 2021-11-13 MED ORDER — SODIUM CHLORIDE 0.9% FLUSH: 3.0000 mL | INTRAVENOUS | Status: DC | PRN

## 2021-11-13 MED ORDER — HEPARIN (PORCINE) 25000 UT/250ML-% IV SOLN
2200.0000 [IU]/h | INTRAVENOUS | Status: DC
  Administered 2021-11-13: 17:00:00 1400 [IU]/h via INTRAVENOUS
  Administered 2021-11-14: 2000 [IU]/h via INTRAVENOUS
  Administered 2021-11-14: 1700 [IU]/h via INTRAVENOUS
  Administered 2021-11-15: 03:00:00 2200 [IU]/h via INTRAVENOUS
  Filled 2021-11-13 (×4): qty 250

## 2021-11-13 MED ORDER — POTASSIUM CHLORIDE CRYS ER 20 MEQ PO TBCR
40.0000 meq | EXTENDED_RELEASE_TABLET | Freq: Once | ORAL | Status: AC
  Administered 2021-11-13: 40 meq via ORAL
  Filled 2021-11-13: qty 2

## 2021-11-13 MED ORDER — ONDANSETRON HCL 4 MG PO TABS: 4.0000 mg | ORAL_TABLET | Freq: Four times a day (QID) | ORAL | Status: DC | PRN

## 2021-11-13 MED ORDER — SODIUM CHLORIDE 0.9% FLUSH
3.0000 mL | Freq: Two times a day (BID) | INTRAVENOUS | Status: DC
  Administered 2021-11-14 – 2021-11-15 (×3): 3 mL via INTRAVENOUS

## 2021-11-13 MED ORDER — SACCHAROMYCES BOULARDII 250 MG PO CAPS
250.0000 mg | ORAL_CAPSULE | Freq: Two times a day (BID) | ORAL | Status: DC
  Administered 2021-11-13 – 2021-11-15 (×4): 250 mg via ORAL
  Filled 2021-11-13 (×5): qty 1

## 2021-11-13 MED ORDER — PROPRANOLOL HCL 20 MG PO TABS
20.0000 mg | ORAL_TABLET | Freq: Every day | ORAL | Status: DC
  Administered 2021-11-14 – 2021-11-15 (×2): 20 mg via ORAL
  Filled 2021-11-13 (×2): qty 1

## 2021-11-13 MED ORDER — ONDANSETRON HCL 4 MG/2ML IJ SOLN: 4.0000 mg | Freq: Four times a day (QID) | INTRAMUSCULAR | Status: DC | PRN

## 2021-11-13 MED ORDER — SODIUM CHLORIDE 0.9 % IV SOLN: 250.0000 mL | INTRAVENOUS | Status: DC | PRN

## 2021-11-13 MED ORDER — ACETAMINOPHEN 500 MG PO TABS
1000.0000 mg | ORAL_TABLET | Freq: Three times a day (TID) | ORAL | Status: DC | PRN
  Administered 2021-11-14 (×2): 1000 mg via ORAL
  Filled 2021-11-13 (×2): qty 2

## 2021-11-13 MED ORDER — MORPHINE SULFATE (PF) 2 MG/ML IV SOLN: 2.0000 mg | INTRAVENOUS | Status: DC | PRN

## 2021-11-13 MED ORDER — MORPHINE SULFATE (PF) 4 MG/ML IV SOLN
4.0000 mg | Freq: Once | INTRAVENOUS | Status: AC
Start: 2021-11-13 — End: 2021-11-13
  Administered 2021-11-13: 4 mg via INTRAVENOUS
  Filled 2021-11-13: qty 1

## 2021-11-13 MED ORDER — HEPARIN BOLUS VIA INFUSION
6000.0000 [IU] | Freq: Once | INTRAVENOUS | Status: AC
  Administered 2021-11-13: 6000 [IU] via INTRAVENOUS
  Filled 2021-11-13: qty 6000

## 2021-11-13 MED ORDER — PAROXETINE HCL 20 MG PO TABS
20.0000 mg | ORAL_TABLET | Freq: Every day | ORAL | Status: DC
  Administered 2021-11-14 – 2021-11-15 (×2): 20 mg via ORAL
  Filled 2021-11-13 (×2): qty 1

## 2021-11-13 MED ORDER — IOHEXOL 350 MG/ML SOLN
75.0000 mL | Freq: Once | INTRAVENOUS | Status: AC | PRN
  Administered 2021-11-13: 75 mL via INTRAVENOUS
  Filled 2021-11-13: qty 75

## 2021-11-13 MED ORDER — HYDROCHLOROTHIAZIDE 25 MG PO TABS
25.0000 mg | ORAL_TABLET | Freq: Every day | ORAL | Status: DC
  Administered 2021-11-14 – 2021-11-15 (×2): 25 mg via ORAL
  Filled 2021-11-13 (×2): qty 1

## 2021-11-13 NOTE — ED Notes (Signed)
Pt resting in bed, complaining of 10/10 left sided pain  Pt states that she noticed left leg swelling on Tuesday and started having sharp left sided chest/abdominal pain today.   Pt is on antibiotics for nfection post surgery

## 2021-11-13 NOTE — ED Provider Notes (Signed)
[facility] Provider Note    Event Date/Time   First MD Initiated Contact with Patient 11/13/21 1331     (approximate)   History   Chief Complaint Shortness of Breath and Leg Swelling   HPI  Krista Blake is a 56 y.o. female with past medical history of hypertension and septic arthritis who presents to the ED complaining of leg swelling and shortness of breath.  Patient reports that she has been recently receiving physical therapy for recurrent septic arthritis in her left knee, currently being maintained on an additional 6 weeks of Keflex per infectious disease at this time.  She reports stopping physical therapy for a couple of weeks following the unexpected death of a family member.  She went back to physical therapy 6 days ago and since then has been dealing with increasing pain in her left calf.  She feels like her left leg is swollen from the ankle up into her thigh along with pain radiating upwards from her calf.  She has not noticed any redness or rash to the leg.  She does state that over the past couple of days she has developed increasing pain on the left side of her chest which is sharp and worse with a deep breath.  She has felt mildly short of breath, particularly with exertion.  She denies any history of DVT/PE, does not take any hormonal medication and denies any recent long trips.     Physical Exam   Triage Vital Signs: ED Triage Vitals  Enc Vitals Group     BP 11/13/21 1327 122/74     Pulse Rate 11/13/21 1327 81     Resp 11/13/21 1327 18     Temp 11/13/21 1327 98.6 F (37 C)     Temp Source 11/13/21 1327 Oral     SpO2 11/13/21 1327 96 %     Weight 11/13/21 1328 230 lb (104.3 kg)     Height 11/13/21 1328 5\' 4"  (1.626 m)     Head Circumference --      Peak Flow --      Pain Score 11/13/21 1327 10     Pain Loc --      Pain Edu? --      Excl. in [place]? --     Most recent vital signs: Vitals:   11/13/21 1327 11/13/21 1530  BP:  122/74 111/69  Pulse: 81 73  Resp: 18 15  Temp: 98.6 F (37 C)   SpO2: 96% 94%    Constitutional: Alert and oriented. Eyes: Conjunctivae are normal. Head: Atraumatic. Nose: No congestion/rhinnorhea. Mouth/Throat: Mucous membranes are moist.  Cardiovascular: Normal rate, regular rhythm. Grossly normal heart sounds.  2+ DP pulses bilaterally. Respiratory: Normal respiratory effort.  No retractions. Lungs CTAB.  No chest wall tenderness to palpation. Gastrointestinal: Soft and nontender. No distention. Musculoskeletal: 1+ pitting edema to mid thigh on left, no edema noted on right.  Left calf tenderness noted with no erythema or warmth.  Able to range at left knee without pain. Neurologic:  Normal speech and language. No gross focal neurologic deficits are appreciated.    ED Results / Procedures / Treatments   Labs (all labs ordered are listed, but only abnormal results are displayed) Labs Reviewed  BASIC METABOLIC PANEL - Abnormal; Notable for the following components:      Result Value   Sodium 134 (*)    Potassium 3.4 (*)    Chloride 96 (*)    Glucose, Bld  148 (*)    All other components within normal limits  RESP PANEL BY RT-PCR (FLU A&B, COVID) ARPGX2  CBC WITH DIFFERENTIAL/PLATELET  BRAIN NATRIURETIC PEPTIDE  TROPONIN I (HIGH SENSITIVITY)  TROPONIN I (HIGH SENSITIVITY)     EKG  ED ECG REPORT I, Blake Divine, the attending physician, personally viewed and interpreted this ECG.   Date: 11/13/2021  EKG Time: 13:30  Rate: 80  Rhythm: normal sinus rhythm  Axis: Normal  Intervals:none  ST&T Change: None  RADIOLOGY CTA of chest reviewed by me and shows bilateral pulmonary embolism with no obvious signs of right heart strain.  PROCEDURES:  Critical Care performed: Yes, see critical care procedure note(s)  .Critical Care Performed by: Blake Divine, MD Authorized by: Blake Divine, MD   Critical care provider statement:    Critical care time  (minutes):  45   Critical care time was exclusive of:  Separately billable procedures and treating other patients and teaching time   Critical care was necessary to treat or prevent imminent or life-threatening deterioration of the following conditions:  Circulatory failure   Critical care was time spent personally by me on the following activities:  Development of treatment plan with patient or surrogate, discussions with consultants, evaluation of patient's response to treatment, examination of patient, ordering and review of laboratory studies, ordering and review of radiographic studies, ordering and performing treatments and interventions, pulse oximetry, re-evaluation of patient's condition and review of old charts   I assumed direction of critical care for this patient from another provider in my specialty: no     Care discussed with: admitting provider   .1-3 Lead EKG Interpretation Performed by: Blake Divine, MD Authorized by: Blake Divine, MD     Interpretation: normal     ECG rate:  70-90   ECG rate assessment: normal     Rhythm: sinus rhythm     Ectopy: none     Conduction: normal     MEDICATIONS ORDERED IN ED: Medications  morphine 4 MG/ML injection 4 mg (4 mg Intravenous Given 11/13/21 1429)  iohexol (OMNIPAQUE) 350 MG/ML injection 75 mL (75 mLs Intravenous Contrast Given 11/13/21 1536)  HYDROmorphone (DILAUDID) injection 1 mg (1 mg Intravenous Given 11/13/21 1603)     IMPRESSION / MDM / ASSESSMENT AND PLAN / ED COURSE  I reviewed the triage vital signs and the nursing notes.                              56 y.o. female with past medical history of hypertension and recurrent septic arthritis who presents to the ED with increasing left leg pain and swelling over the past 6 days now with some left-sided pleuritic chest pain and mild difficulty breathing.   Differential diagnosis includes, but is not limited to, DVT, PE, ACS, pneumonia, pneumothorax, cellulitis, septic  arthritis, musculoskeletal chest pain.  Patient is nontoxic-appearing and in no acute distress, maintaining O2 sats on room air.  She does have significant swelling in her left leg compared to right with associated calf tenderness, we will further assess with ultrasound for DVT.  With her chest pain and shortness of breath, I am also concerned for PE and we will check CTA of her chest.  EKG shows no evidence of arrhythmia or ischemia and I have low suspicion for ACS given atypical symptoms.  We will check labs including CBC, BMP, troponin, and BNP.  Labs are reassuring, CBC and BMP show no  anemia or electrolyte abnormality, troponin within normal limits.  Left lower extremity ultrasound is positive for DVT and CTA shows significant bilateral burden of pulmonary embolism.  There is no evidence of right heart strain by CT.  We will start patient on IV heparin and case discussed with hospitalist for admission.  Patient with significant ongoing left-sided chest pain due to apparent pulmonary infarct and we will medicate with IV Dilaudid.  The patient is on the cardiac monitor to evaluate for evidence of arrhythmia and/or significant heart rate changes.       FINAL CLINICAL IMPRESSION(S) / ED DIAGNOSES   Final diagnoses:  Acute saddle pulmonary embolism without acute cor pulmonale (HCC)  Acute deep vein thrombosis (DVT) of proximal vein of left lower extremity ([place])     Rx / DC Orders   ED Discharge Orders     None        Note:  This document was prepared using Dragon voice recognition software and may include unintentional dictation errors.   Blake Divine, MD 11/13/21 [phone]

## 2021-11-13 NOTE — H&P (Signed)
History and Physical    Krista Blake IZT:[id number] DOB: Feb 23, 1966 DOA: 11/13/2021  PCP: Idelle Crouch, MD   Patient coming from: Home  I have personally briefly reviewed patient's old medical records in [place]  Chief Complaint: Left lateral chest wall pain  HPI: Krista Blake is a 56 y.o. female with medical history significant for morbid obesity, hypertension and septic arthritis for which she is on 6 weeks of Keflex who presents to the emergency room for evaluation of left lateral chest wall pain which is mostly pleuritic and radiates to the left shoulder.  She rated her pain a 7 x 10 in intensity at its worst.  Chest pain is associated with shortness of breath. Patient states that her mobility has been reduced due to multiple left knee surgeries that she has had and she currently ambulates with crutches. She noted swelling of her left leg over the last couple of days and denies having any recent trauma. She denies having any fever, no chills, no cough, no abdominal pain, no nausea, no vomiting, no headache, no dizziness, no lightheadedness, no changes in her bowel habits or urinary symptoms. Sodium 134, potassium 3.4, chloride 96, bicarb 27, glucose 148, BUN 12, creatinine 0.64, calcium 9.1, BNP 49, troponin 5, white count 9.5, hemoglobin 13.1, hematocrit 30.6, MCV 84.8, RDW 13.2, platelet count 153 Respiratory viral panel is pending CT angiogram of the chest is positive for pulmonary embolism with a large burden of embolus present in the left and right pulmonary arteries as well as in the lobar and segmental arteries bilaterally.  Hepatic steatosis.  Multiple bilateral subpleural groundglass airspace opacities with some evidence of central clearing, most conspicuous in the lingula and posterior right upper lobe.  Findings are consistent with multifocal pulmonary infarction in the setting of pulmonary embolism. Left lower extremity venous Doppler shows acute  occlusive DVT in the left popliteal, posterior tibial and peroneal veins. Twelve-lead EKG reviewed by me shows normal sinus rhythm  ED Course: Patient is a 56 year old female who presents to the ER for evaluation of left lateral chest wall pain with radiation to the left shoulder over the last 24 hours prior to admission associated with shortness of breath and left leg swelling. Patient's mobility has been limited due to multiple left knee surgeries and she is currently on antibiotic therapy for septic arthritis following a meniscal repair of her left knee. Left lower extremity venous Doppler shows evidence of occlusive DVT involving the left popliteal, posterior tibial and peroneal veins. CT angiogram shows a large burden of embolus in both right and left pulmonary arteries. Patient will be admitted to the hospital for further evaluation and treatment.  Review of Systems: As per HPI otherwise all other systems reviewed and negative.    Past Medical History:  Diagnosis Date   Bulging lumbar disc    History of mammogram 03/04/2015   BIRAD 1   History of Papanicolaou smear of cervix 02/09/2016   NIL/NEG   Hypertension    Pap smear abnormality of cervix/human papillomavirus (HPV) positive 02/05/2015   NIL Pap with positive HPV aptima.  HPV 16,18.45 neg   Screening for colon cancer 07/2018   neg Cologuard; repeat in 3 yrs    Past Surgical History:  Procedure Laterality Date   I & D EXTREMITY Left 05/04/2021   Procedure: KNEE ARTHROSCOPY WITH IRRIGATION AND DEBRIDEMENT;  Surgeon: Corky Mull, MD;  Location: [facility] ORS;  Service: Orthopedics;  Laterality: Left;   KNEE ARTHROSCOPY Left  06/07/2021   Procedure: ARTHROSCOPIC IRRIGATION AND DEBRIDEMENT;  Surgeon: Corky Mull, MD;  Location: [facility] ORS;  Service: Orthopedics;  Laterality: Left;   KNEE ARTHROSCOPY Left 08/03/2021   Procedure: LEFT KNEE REPEAT IRRIGATION AND DEBRIDEMENT WITH BONE CULTURES. [address];  Surgeon: Corky Mull, MD;   Location: [facility] ORS;  Service: Orthopedics;  Laterality: Left;   KNEE ARTHROSCOPY Left 08/08/2021   Procedure: ARTHROSCOPY KNEE;  Surgeon: Corky Mull, MD;  Location: [facility] ORS;  Service: Orthopedics;  Laterality: Left;     reports that she has never smoked. She has never used smokeless tobacco. She reports current alcohol use. She reports that she does not use drugs.  Allergies  Allergen Reactions   Penicillins Hives    Reaction : Hives during college TOLERATED CEFAZOLIN AND AMOXICILLIN    Family History  Problem Relation Age of Onset   Hypertension Father    Diabetes Paternal Uncle    Diabetes Paternal Grandfather    Ovarian cancer Neg Hx    Breast cancer Neg Hx    Colon cancer Neg Hx       Prior to Admission medications   Medication Sig Start Date End Date Taking? Authorizing Provider  acetaminophen (TYLENOL) 650 MG CR tablet Take 1,300 mg by mouth every 8 (eight) hours as needed for pain.    [provider]  allopurinol (ZYLOPRIM) 100 MG tablet Take 100 mg by mouth daily. 04/29/21   [provider]  hydrochlorothiazide (HYDRODIURIL) 25 MG tablet Take 25 mg by mouth daily.    [provider]  HYDROcodone-acetaminophen (NORCO/VICODIN) 5-325 MG tablet Take 1-2 tablets by mouth every 4 (four) hours as needed for moderate pain or severe pain. 08/09/21   Lattie Corns, PA-C  Magnesium 250 MG TABS Take 250 mg by mouth daily.    [provider]  methocarbamol (ROBAXIN) 750 MG tablet Take 1 tablet (750 mg total) by mouth every 8 (eight) hours as needed for muscle spasms. 08/10/21   Lattie Corns, PA-C  ondansetron (ZOFRAN) 4 MG tablet Take 1 tablet (4 mg total) by mouth every 6 (six) hours as needed for nausea. 06/10/21   Lattie Corns, PA-C  PARoxetine (PAXIL) 20 MG tablet Take 20 mg by mouth daily. 08/04/20 08/04/21  [provider]  propranolol (INDERAL) 40 MG tablet Take 20 mg by mouth daily. 03/18/20   [provider]  rosuvastatin (CRESTOR) 10 MG tablet Take 10 mg by mouth daily. 08/05/20 08/05/21  [provider]    Physical Exam: Vitals:   11/13/21 1327 11/13/21 1328 11/13/21 1530  BP: 122/74  111/69  Pulse: 81  73  Resp: 18  15  Temp: 98.6 F (37 C)    TempSrc: Oral    SpO2: 96%  94%  Weight:  104.3 kg   Height:  5\' 4"  (1.626 m)      Vitals:   11/13/21 1327 11/13/21 1328 11/13/21 1530  BP: 122/74  111/69  Pulse: 81  73  Resp: 18  15  Temp: 98.6 F (37 C)    TempSrc: Oral    SpO2: 96%  94%  Weight:  104.3 kg   Height:  5\' 4"  (1.626 m)       Constitutional: Alert and oriented x 3 . Not in any apparent distress.  Morbidly obese HEENT:      Head: Normocephalic and atraumatic.         Eyes: PERLA, EOMI, Conjunctivae are normal. Sclera is non-icteric.  Mouth/Throat: Mucous membranes are moist.       Neck: Supple with no signs of meningismus. Cardiovascular: Regular rate and rhythm. No murmurs, gallops, or rubs. 2+ symmetrical distal pulses are present . No JVD. No LE edema Respiratory: Respiratory effort normal .Lungs sounds clear bilaterally. No wheezes, crackles, or rhonchi.  Gastrointestinal: Soft, non tender, and non distended with positive bowel sounds.  Genitourinary: No CVA tenderness. Musculoskeletal: Swelling involving the left lower extremity when compared to right lower extremity.  Nontender with normal range of motion in all extremities. No cyanosis, or erythema of extremities. Neurologic:  Face is symmetric. Moving all extremities. No gross focal neurologic deficits . Skin: Skin is warm, dry.  No rash or ulcers Psychiatric: Mood and affect are normal    Labs on Admission: I have personally reviewed following labs and imaging studies  CBC: Recent Labs  Lab 11/13/21 1403  WBC 9.5  NEUTROABS 6.5  HGB 13.1  HCT 38.6  MCV 84.8  PLT 841   Basic Metabolic Panel: Recent Labs  Lab 11/13/21 1403  NA 134*  K 3.4*  CL 96*  CO2 27  GLUCOSE 148*   BUN 12  CREATININE 0.64  CALCIUM 9.1   GFR: Estimated Creatinine Clearance: 94.5 mL/min (by C-G formula based on SCr of 0.64 mg/dL). Liver Function Tests: No results for input(s): AST, ALT, ALKPHOS, BILITOT, PROT, ALBUMIN in the last 168 hours. No results for input(s): LIPASE, AMYLASE in the last 168 hours. No results for input(s): AMMONIA in the last 168 hours. Coagulation Profile: No results for input(s): INR, PROTIME in the last 168 hours. Cardiac Enzymes: No results for input(s): CKTOTAL, CKMB, CKMBINDEX, TROPONINI in the last 168 hours. BNP (last 3 results) No results for input(s): PROBNP in the last 8760 hours. HbA1C: No results for input(s): HGBA1C in the last 72 hours. CBG: No results for input(s): GLUCAP in the last 168 hours. Lipid Profile: No results for input(s): CHOL, HDL, LDLCALC, TRIG, CHOLHDL, LDLDIRECT in the last 72 hours. Thyroid Function Tests: No results for input(s): TSH, T4TOTAL, FREET4, T3FREE, THYROIDAB in the last 72 hours. Anemia Panel: No results for input(s): VITAMINB12, FOLATE, FERRITIN, TIBC, IRON, RETICCTPCT in the last 72 hours. Urine analysis:    Component Value Date/Time   COLORURINE YELLOW 10/02/2015 [place] 10/02/2015 0824   LABSPEC 1.020 10/02/2015 0824   PHURINE 7.5 10/02/2015 0824   GLUCOSEU NEGATIVE 10/02/2015 0824   HGBUR TRACE (A) 10/02/2015 0824   BILIRUBINUR 1+ (A) 10/02/2015 0824   KETONESUR NEGATIVE 10/02/2015 0824   PROTEINUR PRESENT (A) 10/02/2015 0824   NITRITE NEGATIVE 10/02/2015 0824   LEUKOCYTESUR NEGATIVE 10/02/2015 0824    Radiological Exams on Admission: CT Angio Chest PE W/Cm &/Or Wo Cm  Result Date: 11/13/2021 CLINICAL DATA:  PE suspected, left calf pain and swelling EXAM: CT ANGIOGRAPHY CHEST WITH CONTRAST TECHNIQUE: Multidetector CT imaging of the chest was performed using the standard protocol during bolus administration of intravenous contrast. Multiplanar CT image reconstructions and MIPs  were obtained to evaluate the vascular anatomy. RADIATION DOSE REDUCTION: This exam was performed according to the departmental dose-optimization program which includes automated exposure control, adjustment of the mA and/or kV according to patient size and/or use of iterative reconstruction technique. CONTRAST:  59mL OMNIPAQUE IOHEXOL 350 MG/ML SOLN COMPARISON:  None. FINDINGS: Cardiovascular: Positive examination for pulmonary embolism, with a large burden of embolus present in the left and right pulmonary arteries as well as in the lobar and segmental arteries bilaterally. Mild, global  cardiomegaly. RV LV ratio is preserved, less than 0.9. Main pulmonary artery is normal in caliber. No pericardial effusion. Mediastinum/Nodes: No enlarged mediastinal, hilar, or axillary lymph nodes. Thyroid gland, trachea, and esophagus demonstrate no significant findings. Lungs/Pleura: Multiple subpleural ground-glass airspace opacities with some evidence of central clearing, most conspicuously in the lingula (series 6, image 52) and posterior right upper lobe (series 6, image 32). No pleural effusion or pneumothorax. Upper Abdomen: No acute abnormality.  Hepatic steatosis. Musculoskeletal: No chest wall abnormality. No acute osseous findings. Review of the MIP images confirms the above findings. IMPRESSION: 1. Positive examination for pulmonary embolism, with a large burden of embolus present in the left and right pulmonary arteries as well as in the lobar and segmental arteries bilaterally. 2. Mild, global cardiomegaly. RV LV ratio is preserved, less than 0.9. Main pulmonary artery is normal in caliber. 3. Multiple bilateral subpleural ground-glass airspace opacities with some evidence of central clearing, most conspicuously in the lingula and posterior right upper lobe. Findings are consistent with multifocal pulmonary infarction in the setting of pulmonary embolism. 4. Hepatic steatosis. These results were called by telephone  at the time of interpretation on 11/13/2021 at 3:53 pm to Dr. Blake Divine , who verbally acknowledged these results. Electronically Signed   By: Delanna Ahmadi M.D.   On: 11/13/2021 15:57   US Venous Img Lower Unilateral Left  Result Date: 11/13/2021 CLINICAL DATA:  Left leg pain and swelling EXAM: Left LOWER EXTREMITY VENOUS DOPPLER ULTRASOUND TECHNIQUE: Gray-scale sonography with compression, as well as color and duplex ultrasound, were performed to evaluate the deep venous system(s) from the level of the common femoral vein through the popliteal and proximal calf veins. COMPARISON:  None. FINDINGS: VENOUS Normal compressibility of the common femoral, superficial femoral, and profunda veins. Hypoechoic noncompressible occlusive thrombus in the popliteal vein, peroneal vein and posterior tibial veins. Limited views of the contralateral common femoral vein are unremarkable. OTHER None. Limitations: none IMPRESSION: Acute occlusive DVT in the left popliteal, posterior tibial, peroneal veins. Electronically Signed   By: Ofilia Neas M.D.   On: 11/13/2021 14:24     Assessment/Plan Principal Problem:   Pulmonary embolism (HCC) Active Problems:   Essential hypertension   Septic arthritis of knee, left (HCC)   Obesity (BMI 30-39.9)   Hypokalemia   Patient is a 56 year old female admitted to the hospital for pulmonary embolism.   Acute pulmonary embolism/left leg DVT Patient has limited mobility following knee surgery and now has a left leg DVT as well as bilateral pulmonary emboli. Start patient on heparin Pain control May need vascular surgery consult    Hypokalemia Secondary to diuretic therapy Supplement potassium    History of left knee septic arthritis Continue Keflex 500 mg p.o. every 6 per ID recommendation    Depression Continue Paxil    Hypertension Blood pressure is stable on Inderal and hydrochlorothiazide   Morbid obesity (BMI 39) Complicates overall  prognosis and care   DVT prophylaxis: Heparin Code Status: full code  Family Communication: Greater than 50% of time was spent discussing patient's condition and plan of care with her at the bedside.  All questions and concerns have been addressed.  She verbalizes understanding and agrees to the plan. Disposition Plan: Back to previous home environment Consults called: none  Status:At the time of admission, it appears that the appropriate admission status for this patient is inpatient. This is judged to be reasonable and necessary to provide the required intensity of service to ensure the patient's  safety given the presenting symptoms, physical exam findings, and initial radiographic and laboratory data in the context of their comorbid conditions. Patient requires inpatient status due to high intensity of service, high risk for further deterioration and high frequency of surveillance required.     Collier Bullock MD [facility]     11/13/2021, 4:54 PM

## 2021-11-13 NOTE — ED Notes (Addendum)
Pt pulled off nasal cannula when moving around and desaturated to high 80's. Replaced nasal cannula at 2L/min. Pt also reported had moved it due to nasal itching

## 2021-11-13 NOTE — ED Notes (Addendum)
Pt reported some increased SOB possibly due to pain. Requested to have increased oxygen temporarily- increased to 4L/min nasal cannula for now. Sats 97%. Will continue to monitor. Pt originally was put on 2L/min after ambulating to use bathroom and became SOB.

## 2021-11-13 NOTE — ED Triage Notes (Signed)
Pt via POV from home. Pt has an extensive hx with her L knee with multiple surgeries. Pt states that she missed PT for 2 weeks and on Monday she started again. Tuesday, pt then states she had some pain in her L calf and some swelling. Also, endorses some SOB and CP that started yesterday. Pt is A&OX4 and NAD.

## 2021-11-13 NOTE — ED Notes (Addendum)
Pt reported "feeling clammy and warm" after Dilaudid but denies nausea or lightheadedness or dizziness. Mild diaphoresis on face/head. Temp rechecked and WNL. Provided cool cloth for face and adjusted thermometer in room. Pt also reporting nose was itching and having mild itching after Dilaudid- let her know that does happen and to inform me if continues and I could reach out to provider for medication.

## 2021-11-13 NOTE — Consult Note (Signed)
ANTICOAGULATION CONSULT NOTE - Initial Consult  Pharmacy Consult for heparin Indication: pulmonary embolus and DVT  Allergies  Allergen Reactions   Penicillins Hives    Reaction : Hives during college TOLERATED CEFAZOLIN AND AMOXICILLIN    Patient Measurements: Height: 5\' 4"  (162.6 cm) Weight: 104.3 kg (230 lb) IBW/kg (Calculated) : 54.7 Heparin Dosing Weight: 79.2kg  Vital Signs: Temp: 98.6 F (37 C) (01/22 1327) Temp Source: Oral (01/22 1327) BP: 111/69 (01/22 1530) Pulse Rate: 73 (01/22 1530)  Labs: Recent Labs    11/13/21 1403  HGB 13.1  HCT 38.6  PLT 153  CREATININE 0.64  TROPONINIHS 5    Estimated Creatinine Clearance: 94.5 mL/min (by C-G formula based on SCr of 0.64 mg/dL).   Medical History: Past Medical History:  Diagnosis Date   Bulging lumbar disc    History of mammogram 03/04/2015   BIRAD 1   History of Papanicolaou smear of cervix 02/09/2016   NIL/NEG   Hypertension    Pap smear abnormality of cervix/human papillomavirus (HPV) positive 02/05/2015   NIL Pap with positive HPV aptima.  HPV 16,18.45 neg   Screening for colon cancer 07/2018   neg Cologuard; repeat in 3 yrs    Medications:  PTA : No anticoagulation or antiplatelet therapy prior to admission Inpatient: Heparin Drip (1/22 >>) Allergies: No AC/APT reported allergies   Date Time aPTT/HL Rate/Comment       Baseline Labs: aPTT - pending INR -  pending Hgb - 13.1 Plts - 153  Assessment: 56 y.o. female with past medical history of hypertension and recurrent septic arthritis who presents to the ED with increasing LLE pain and swelling x6 days with some chest pain and difficulty breathing. LLE [place] is positive for DVT and CTA shows significant bilateral burden of pulmonary embolism. Pharmacy consulted for initiation and management of IV heparin in the setting of DVT and pulmonary embolism.   Goal of Therapy:  Heparin level 0.3-0.7 units/ml Monitor platelets by anticoagulation  protocol: Yes   Plan:  Give 6000 units bolus x1; then start heparin infusion at 1400 units/hr Check anti-Xa level in 6 hours and daily once consecutively therapeutic. Continue to monitor H&H and platelets daily while on heparin gtt.  Darrick Penna 11/13/2021,4:42 PM

## 2021-11-13 NOTE — ED Notes (Addendum)
Replaced nasal cannula and started at 2L/min. pt had taken off due to feeling better for a few minutes however RR between 22-24 and pt sats 89-90% on r/a. Informed pt that she needed to wear nasal cannula again due to sats. Saturation improved to 94% on 2L/min O2.

## 2021-11-14 ENCOUNTER — Inpatient Hospital Stay: Payer: BC Managed Care – PPO

## 2021-11-14 ENCOUNTER — Ambulatory Visit: Payer: BC Managed Care – PPO | Admitting: Obstetrics and Gynecology

## 2021-11-14 DIAGNOSIS — I82402 Acute embolism and thrombosis of unspecified deep veins of left lower extremity: Secondary | ICD-10-CM

## 2021-11-14 DIAGNOSIS — F32A Depression, unspecified: Secondary | ICD-10-CM

## 2021-11-14 DIAGNOSIS — F419 Anxiety disorder, unspecified: Secondary | ICD-10-CM

## 2021-11-14 DIAGNOSIS — E871 Hypo-osmolality and hyponatremia: Secondary | ICD-10-CM

## 2021-11-14 LAB — PROTIME-INR
INR: 1.1 (ref 0.8–1.2)
Prothrombin Time: 13.9 seconds (ref 11.4–15.2)

## 2021-11-14 LAB — CBC
HCT: 34.3 % — ABNORMAL LOW (ref 36.0–46.0)
Hemoglobin: 11.8 g/dL — ABNORMAL LOW (ref 12.0–15.0)
MCH: 29.3 pg (ref 26.0–34.0)
MCHC: 34.4 g/dL (ref 30.0–36.0)
MCV: 85.1 fL (ref 80.0–100.0)
Platelets: 144 10*3/uL — ABNORMAL LOW (ref 150–400)
RBC: 4.03 MIL/uL (ref 3.87–5.11)
RDW: 13.1 % (ref 11.5–15.5)
WBC: 9.1 10*3/uL (ref 4.0–10.5)
nRBC: 0 % (ref 0.0–0.2)

## 2021-11-14 LAB — APTT: aPTT: 40 seconds — ABNORMAL HIGH (ref 24–36)

## 2021-11-14 LAB — BASIC METABOLIC PANEL
Anion gap: 7 (ref 5–15)
BUN: 10 mg/dL (ref 6–20)
CO2: 30 mmol/L (ref 22–32)
Calcium: 8.9 mg/dL (ref 8.9–10.3)
Chloride: 96 mmol/L — ABNORMAL LOW (ref 98–111)
Creatinine, Ser: 0.64 mg/dL (ref 0.44–1.00)
GFR, Estimated: >60 mL/min (ref >60)
Glucose, Bld: 168 mg/dL — ABNORMAL HIGH (ref 70–99)
Potassium: 3.8 mmol/L (ref 3.5–5.1)
Sodium: 133 mmol/L — ABNORMAL LOW (ref 135–145)

## 2021-11-14 LAB — HIV ANTIBODY (ROUTINE TESTING W REFLEX): HIV Screen 4th Generation wRfx: NONREACTIVE

## 2021-11-14 LAB — HEPARIN LEVEL (UNFRACTIONATED)
Heparin Unfractionated: <0.1 IU/mL — ABNORMAL LOW (ref 0.30–0.70)
Heparin Unfractionated: 0.18 IU/mL — ABNORMAL LOW (ref 0.30–0.70)
Heparin Unfractionated: 0.26 IU/mL — ABNORMAL LOW (ref 0.30–0.70)

## 2021-11-14 MED ORDER — BENZONATATE 100 MG PO CAPS
100.0000 mg | ORAL_CAPSULE | Freq: Two times a day (BID) | ORAL | Status: DC
  Administered 2021-11-14 – 2021-11-15 (×2): 100 mg via ORAL
  Filled 2021-11-14 (×2): qty 1

## 2021-11-14 MED ORDER — HEPARIN BOLUS VIA INFUSION
1200.0000 [IU] | Freq: Once | INTRAVENOUS | Status: AC
  Administered 2021-11-14: 1200 [IU] via INTRAVENOUS
  Filled 2021-11-14: qty 1200

## 2021-11-14 MED ORDER — HEPARIN BOLUS VIA INFUSION
2400.0000 [IU] | Freq: Once | INTRAVENOUS | Status: AC
  Administered 2021-11-14: 2400 [IU] via INTRAVENOUS
  Filled 2021-11-14: qty 2400

## 2021-11-14 MED ORDER — HYDROCODONE-ACETAMINOPHEN 5-325 MG PO TABS
1.0000 | ORAL_TABLET | Freq: Four times a day (QID) | ORAL | Status: DC | PRN
  Administered 2021-11-14 – 2021-11-15 (×3): 1 via ORAL
  Filled 2021-11-14 (×3): qty 1

## 2021-11-14 MED ORDER — HYDROMORPHONE HCL 1 MG/ML IJ SOLN: 0.5000 mg | INTRAMUSCULAR | Status: DC | PRN

## 2021-11-14 NOTE — Consult Note (Addendum)
ANTICOAGULATION CONSULT NOTE  Pharmacy Consult for heparin Indication: pulmonary embolus and DVT  Patient Measurements: Height: 5\' 4"  (162.6 cm) Weight: 104.3 kg (230 lb) IBW/kg (Calculated) : 54.7 Heparin Dosing Weight: 79.2kg  Labs: Recent Labs    11/13/21 1403 11/13/21 1635 11/14/21 0102 11/14/21 0600 11/14/21 1006  HGB 13.1  --   --  11.8*  --   HCT 38.6  --   --  34.3*  --   PLT 153  --   --  144*  --   APTT  --   --  40*  --   --   LABPROT  --   --  13.9  --   --   INR  --   --  1.1  --   --   HEPARINUNFRC  --   --  <0.10*  --  0.18*  CREATININE 0.64  --   --  0.64  --   TROPONINIHS 5 5  --   --   --      Estimated Creatinine Clearance: 94.5 mL/min (by C-G formula based on SCr of 0.64 mg/dL).  Medications:  PTA : No anticoagulation or antiplatelet therapy prior to admission Inpatient: Heparin Drip (1/22 >>) Allergies: No AC/APT reported allergies   Date Time aPTT/HL       Rate/Comment 1/23 1006 0.18              1700 u/hr, subtherapeutic  Baseline Labs: aPTT - pending INR -  pending Hgb - 13.1 Plts - 153  Assessment: 56 y.o. female with past medical history of hypertension and recurrent septic arthritis who presents to the ED with increasing LLE pain and swelling x6 days with some chest pain and difficulty breathing. LLE [place] is positive for DVT and CTA shows significant bilateral burden of pulmonary embolism. Pharmacy consulted for initiation and management of IV heparin in the setting of DVT and pulmonary embolism.   Goal of Therapy:  Heparin level 0.3-0.7 units/ml Monitor platelets by anticoagulation protocol: Yes   Plan:  --1/23:  HL @ 1006 = 1.8, subtherapeutic  --Will order heparin 2400 units IV X 1 bolus and increase drip rate to 2000 units/hr.  --Will recheck HL 6 hrs after rate change.  --Daily CBC while on heparin drip per protocol  Owens Loffler, PharmD Candidate 11/14/2021,11:23 AM

## 2021-11-14 NOTE — ED Notes (Signed)
Pt urinated- collected urine to send to lab to hold in case needed.  Assisted pt to bedside commode- required assistance due to left leg pain/weakness. Pt was SOB after walking few steps to bedside commode and being off oxygen. Replaced nasal cannula when back in bed and sats were 94%

## 2021-11-14 NOTE — ED Notes (Addendum)
Pt described new onset headache- 7-8/10 in temples. Requested pain medication. Pt due for Dilaudid as requested for pain for PE and will also provide tylenol. Informed pt to tell nurse if anything changes with headache- vision changes, numbness/tingling, sharp pain, etc.

## 2021-11-14 NOTE — Consult Note (Signed)
ANTICOAGULATION CONSULT NOTE - Initial Consult  Pharmacy Consult for heparin Indication: pulmonary embolus and DVT  Allergies  Allergen Reactions   Penicillins Hives    Reaction : Hives during college TOLERATED CEFAZOLIN AND AMOXICILLIN    Patient Measurements: Height: 5\' 4"  (162.6 cm) Weight: 104.3 kg (230 lb) IBW/kg (Calculated) : 54.7 Heparin Dosing Weight: 79.2kg  Vital Signs: Temp: 98.7 F (37.1 C) (01/23 0100) Temp Source: Oral (01/23 0100) BP: 116/78 (01/23 0100) Pulse Rate: 87 (01/23 0100)  Labs: Recent Labs    11/13/21 1403 11/13/21 1635 11/14/21 0102  HGB 13.1  --   --   HCT 38.6  --   --   PLT 153  --   --   APTT  --   --  40*  LABPROT  --   --  13.9  INR  --   --  1.1  HEPARINUNFRC  --   --  <0.10*  CREATININE 0.64  --   --   TROPONINIHS 5 5  --      Estimated Creatinine Clearance: 94.5 mL/min (by C-G formula based on SCr of 0.64 mg/dL).   Medical History: Past Medical History:  Diagnosis Date   Bulging lumbar disc    History of mammogram 03/04/2015   BIRAD 1   History of Papanicolaou smear of cervix 02/09/2016   NIL/NEG   Hypertension    Pap smear abnormality of cervix/human papillomavirus (HPV) positive 02/05/2015   NIL Pap with positive HPV aptima.  HPV 16,18.45 neg   Screening for colon cancer 07/2018   neg Cologuard; repeat in 3 yrs    Medications:  PTA : No anticoagulation or antiplatelet therapy prior to admission Inpatient: Heparin Drip (1/22 >>) Allergies: No AC/APT reported allergies   Date Time aPTT/HL Rate/Comment       Baseline Labs: aPTT - pending INR -  pending Hgb - 13.1 Plts - 153  Assessment: 56 y.o. female with past medical history of hypertension and recurrent septic arthritis who presents to the ED with increasing LLE pain and swelling x6 days with some chest pain and difficulty breathing. LLE [place] is positive for DVT and CTA shows significant bilateral burden of pulmonary embolism. Pharmacy consulted for  initiation and management of IV heparin in the setting of DVT and pulmonary embolism.   Goal of Therapy:  Heparin level 0.3-0.7 units/ml Monitor platelets by anticoagulation protocol: Yes   Plan:  1/23:  HL @ 0102 = < 0.1, subtherapeutic  Will order heparin 2400 units IV X 1 bolus and increase drip rate to 1700 units/hr.  Will recheck HL 6 hrs after rate change.   Richard Holz D 11/14/2021,2:56 AM

## 2021-11-14 NOTE — ED Notes (Signed)
Messaged NP regarding new finding/patient complaint. Pt now reporting left lateral ankle swelling and some pressure/pain and describes it as "feeling bruised" and it is more swollen and is near her achilles/posterior tibial area. It is warm to the touch and has some redness.

## 2021-11-14 NOTE — Consult Note (Signed)
ANTICOAGULATION CONSULT NOTE  Pharmacy Consult for heparin Indication: pulmonary embolus and DVT  Patient Measurements: Height: 5\' 4"  (162.6 cm) Weight: 106.3 kg (234 lb 6.4 oz) IBW/kg (Calculated) : 54.7 Heparin Dosing Weight: 79.2kg  Labs: Recent Labs    11/13/21 1403 11/13/21 1635 11/14/21 0102 11/14/21 0600 11/14/21 1006 11/14/21 1806  HGB 13.1  --   --  11.8*  --   --   HCT 38.6  --   --  34.3*  --   --   PLT 153  --   --  144*  --   --   APTT  --   --  40*  --   --   --   LABPROT  --   --  13.9  --   --   --   INR  --   --  1.1  --   --   --   HEPARINUNFRC  --   --  <0.10*  --  0.18* 0.26*  CREATININE 0.64  --   --  0.64  --   --   TROPONINIHS 5 5  --   --   --   --      Estimated Creatinine Clearance: 95.6 mL/min (by C-G formula based on SCr of 0.64 mg/dL).  Medications:  PTA : No anticoagulation or antiplatelet therapy prior to admission Inpatient: Heparin Drip (1/22 >>) Allergies: No AC/APT reported allergies   Date Time aPTT/HL       Rate/Comment 1/23 1006 0.18              1700 u/hr, subtherapeutic  Baseline Labs: aPTT - pending INR -  pending Hgb - 13.1 Plts - 153  Assessment: 56 y.o. female with past medical history of hypertension and recurrent septic arthritis who presents to the ED with increasing LLE pain and swelling x6 days with some chest pain and difficulty breathing. LLE [place] is positive for DVT and CTA shows significant bilateral burden of pulmonary embolism. Pharmacy consulted for initiation and management of IV heparin in the setting of DVT and pulmonary embolism.   Goal of Therapy:  Heparin level 0.3-0.7 units/ml Monitor platelets by anticoagulation protocol: Yes   Plan:  --1/23:  HL @ 1806 = 0.26, subtherapeutic  --Will order heparin 1200 units IV X 1 bolus and increase drip rate to 2200 units/hr.  --Will recheck HL 6 hrs after rate change.  --Daily CBC while on heparin drip per protocol  Pearla Dubonnet, PharmD Clinical  Pharmacist 11/14/2021 7:00 PM

## 2021-11-14 NOTE — ED Notes (Addendum)
Assisted pt to bedside commode- required assistance due to left leg pain/weakness. Pt was SOB and attached nasal cannula to portable oxygen attached to bed instead of wall unit which helped patient will overall comfort and SOB/WOB.

## 2021-11-14 NOTE — Progress Notes (Signed)
PROGRESS NOTE  Krista Blake [facility]:[id number] DOB: 12/15/1965   PCP: Idelle Crouch, MD  Patient is from: Home.  DOA: 11/13/2021 LOS: 1  Chief complaints:  Chief Complaint  Patient presents with   Shortness of Breath   Leg Swelling     Brief Narrative / Interim history: 56 year old F with PMH of morbid obesity, HTN, left knee septic arthritis/osteomyelitis with MSSA s/p arthroscopy on 08/08/2021 for which she was discharged on IV Ancef through 09/20/2021, and currently on p.o. Keflex, presenting with left lateral pleuritic chest pain with shortness of breath and LLE edema/swelling, and found to have bilateral PE with possible multifocal pulmonary infarction and LLE DVT.  RV/LV ratio of 0.9.  Started on IV heparin.  Subjective: Seen and examined earlier this morning.  Reports pleuritic left-sided chest pain with deep breathing.  Also complains left ankle swelling and pain.   Objective: Vitals:   11/14/21 0059 11/14/21 0100 11/14/21 0318 11/14/21 0648  BP: 116/78 116/78 111/73 120/64  Pulse: 87 87 90 86  Resp: (!) 23 18 16 17   Temp:  98.7 F (37.1 C)  98.3 F (36.8 C)  TempSrc:  Oral  Oral  SpO2: 95% 92% 95% 94%  Weight:      Height:        Examination:  GENERAL: No apparent distress.  Nontoxic. HEENT: MMM.  Vision and hearing grossly intact.  NECK: Supple.  No apparent JVD.  RESP:  No IWOB.  Fair aeration bilaterally. CVS:  RRR. Heart sounds normal.  ABD/GI/GU: BS+. Abd soft, NTND.  MSK/EXT:  Moves extremities.  Relative LLE swelling.  Slight swelling and tenderness behind left lateral malleolus.  No erythema. SKIN: no apparent skin lesion or wound NEURO: Awake, alert and oriented appropriately.  No apparent focal neuro deficit. PSYCH: Calm. Normal affect.   Procedures:  None  Microbiology summarized: PNTIR-44 and influenza PCR nonreactive.  Assessment & Plan: Acute pulmonary embolism with pulmonary infarction/left leg DVT-patient had left knee  arthroscopic surgery in October.  At that time, she was discharged on Lovenox.  She says she was not excessively immobile but has been using crutches.  Not on HRT.  No prior personal or family history of blood clot.  No history of malignancy.  Reports normal Cologuard.  No signs of significant RV strain.  On room air.  Hemodynamically stable but significant pleuritic chest pain with deep breathing. -Check echocardiogram -Continue IV heparin today -As needed IV Dilaudid for pain control -Transition to p.o. DOAC in the morning  Hypokalemia: Resolved.  Likely due to HCTZ.  Mild hyponatremia: Na 133.  Likely due to HCTZ. -Recheck in the morning   History of left knee septic arthritis/osteomyelitis s/p arthroscopic surgery on 08/08/2021-no evidence of active infection. -Completed IV Ancef through 09/20/2021.  Currently on p.o. Keflex -Continue p.o. Keflex.   Left ankle swelling/tenderness-could be from DVT.  She has mild tenderness behind her left lateral malleolus.  No erythema or signs of obvious infection -Check x-ray -Close monitoring given history of septic arthritis.   Anxiety and depression: Stable. -Continue home Paxil.  Essential hypertension: Normotensive. -Continue home Inderal and HCTZ   Mild thrombocytopenia -Monitor  Class II obesity Body mass index is 39.48 kg/m.  -Encourage lifestyle change to lose weight.       DVT prophylaxis:    On IV heparin for PE/DVT.  Code Status: Full code Family Communication: Patient and/or RN. Available if any question.  Level of care: Progressive Status is: Inpatient  Remains inpatient appropriate because:  Due to pulmonary embolism/DVT with pulmonary infarction       Consultants:  None   Sch Meds:  Scheduled Meds:  allopurinol  100 mg Oral Daily   cephALEXin  500 mg Oral Q6H   hydrochlorothiazide  25 mg Oral Daily   magnesium oxide  400 mg Oral Daily   PARoxetine  20 mg Oral Daily   propranolol  20 mg Oral Daily    rosuvastatin  10 mg Oral Daily   saccharomyces boulardii  250 mg Oral BID   sodium chloride flush  3 mL Intravenous Q12H   sodium chloride flush  3 mL Intravenous Q12H   Continuous Infusions:  sodium chloride     heparin 2,000 Units/hr (11/14/21 1136)   PRN Meds:.sodium chloride, acetaminophen, HYDROmorphone (DILAUDID) injection, ondansetron **OR** ondansetron (ZOFRAN) IV, sodium chloride flush  Antimicrobials: Anti-infectives (From admission, onward)    Start     Dose/Rate Route Frequency Ordered Stop   11/13/21 2015  cephALEXin (KEFLEX) capsule 500 mg        500 mg Oral Every 6 hours 11/13/21 1646          I have personally reviewed the following labs and images: CBC: Recent Labs  Lab 11/13/21 1403 11/14/21 0600  WBC 9.5 9.1  NEUTROABS 6.5  --   HGB 13.1 11.8*  HCT 38.6 34.3*  MCV 84.8 85.1  PLT 153 144*   BMP &GFR Recent Labs  Lab 11/13/21 1403 11/14/21 0600  NA 134* 133*  K 3.4* 3.8  CL 96* 96*  CO2 27 30  GLUCOSE 148* 168*  BUN 12 10  CREATININE 0.64 0.64  CALCIUM 9.1 8.9   Estimated Creatinine Clearance: 94.5 mL/min (by C-G formula based on SCr of 0.64 mg/dL). Liver & Pancreas: No results for input(s): AST, ALT, ALKPHOS, BILITOT, PROT, ALBUMIN in the last 168 hours. No results for input(s): LIPASE, AMYLASE in the last 168 hours. No results for input(s): AMMONIA in the last 168 hours. Diabetic: No results for input(s): HGBA1C in the last 72 hours. No results for input(s): GLUCAP in the last 168 hours. Cardiac Enzymes: No results for input(s): CKTOTAL, CKMB, CKMBINDEX, TROPONINI in the last 168 hours. No results for input(s): PROBNP in the last 8760 hours. Coagulation Profile: Recent Labs  Lab 11/14/21 0102  INR 1.1   Thyroid Function Tests: No results for input(s): TSH, T4TOTAL, FREET4, T3FREE, THYROIDAB in the last 72 hours. Lipid Profile: No results for input(s): CHOL, HDL, LDLCALC, TRIG, CHOLHDL, LDLDIRECT in the last 72 hours. Anemia  Panel: No results for input(s): VITAMINB12, FOLATE, FERRITIN, TIBC, IRON, RETICCTPCT in the last 72 hours. Urine analysis:    Component Value Date/Time   COLORURINE YELLOW 10/02/2015 [place] 10/02/2015 0824   LABSPEC 1.020 10/02/2015 0824   PHURINE 7.5 10/02/2015 0824   GLUCOSEU NEGATIVE 10/02/2015 0824   HGBUR TRACE (A) 10/02/2015 0824   BILIRUBINUR 1+ (A) 10/02/2015 0824   KETONESUR NEGATIVE 10/02/2015 0824   PROTEINUR PRESENT (A) 10/02/2015 0824   NITRITE NEGATIVE 10/02/2015 0824   LEUKOCYTESUR NEGATIVE 10/02/2015 0824   Sepsis Labs: Invalid input(s): PROCALCITONIN, [place]  Microbiology: Recent Results (from the past 240 hour(s))  Resp Panel by RT-PCR (Flu A&B, Covid) Nasopharyngeal Swab     Status: None   Collection Time: 11/13/21  4:35 PM   Specimen: Nasopharyngeal Swab; Nasopharyngeal(NP) swabs in vial transport medium  Result Value Ref Range Status   SARS Coronavirus 2 by RT PCR NEGATIVE NEGATIVE Final    Comment: (NOTE)  SARS-CoV-2 target nucleic acids are NOT DETECTED.  The SARS-CoV-2 RNA is generally detectable in upper respiratory specimens during the acute phase of infection. The lowest concentration of SARS-CoV-2 viral copies this assay can detect is 138 copies/mL. A negative result does not preclude SARS-Cov-2 infection and should not be used as the sole basis for treatment or other patient management decisions. A negative result may occur with  improper specimen collection/handling, submission of specimen other than nasopharyngeal swab, presence of viral mutation(s) within the areas targeted by this assay, and inadequate number of viral copies(<138 copies/mL). A negative result must be combined with clinical observations, patient history, and epidemiological information. The expected result is Negative.  Fact Sheet for Patients:  EntrepreneurPulse.com.au  Fact Sheet for Healthcare Providers:   IncredibleEmployment.be  This test is no t yet approved or cleared by the [place] FDA and  has been authorized for detection and/or diagnosis of SARS-CoV-2 by FDA under an Emergency Use Authorization (EUA). This EUA will remain  in effect (meaning this test can be used) for the duration of the COVID-19 declaration under Section 564(b)(1) of the Act, 21 U.S.C.section 360bbb-3(b)(1), unless the authorization is terminated  or revoked sooner.       Influenza A by PCR NEGATIVE NEGATIVE Final   Influenza B by PCR NEGATIVE NEGATIVE Final    Comment: (NOTE) The Xpert Xpress SARS-CoV-2/FLU/RSV plus assay is intended as an aid in the diagnosis of influenza from Nasopharyngeal swab specimens and should not be used as a sole basis for treatment. Nasal washings and aspirates are unacceptable for Xpert Xpress SARS-CoV-2/FLU/RSV testing.  Fact Sheet for Patients: EntrepreneurPulse.com.au  Fact Sheet for Healthcare Providers: IncredibleEmployment.be  This test is not yet approved or cleared by the [place] FDA and has been authorized for detection and/or diagnosis of SARS-CoV-2 by FDA under an Emergency Use Authorization (EUA). This EUA will remain in effect (meaning this test can be used) for the duration of the COVID-19 declaration under Section 564(b)(1) of the Act, 21 U.S.C. section 360bbb-3(b)(1), unless the authorization is terminated or revoked.  Performed at [facility], [address] [place], [place] [address]     Radiology Studies: DG Ankle Complete Left  Result Date: 11/14/2021 CLINICAL DATA:  56 year old female with pulmonary embolus, left lower extremity DVT, left leg swelling. EXAM: LEFT ANKLE COMPLETE - 3+ VIEW COMPARISON:  Left lower extremity Doppler ultrasound 11/13/2021. FINDINGS: Widespread soft tissue swelling and stranding, more so medial. Small chronic ossific fragment adjacent to the  medial malleolus. Preserved mortise joint alignment. No definite joint effusion. No acute osseous abnormality identified. Calcaneus degenerative spurring. IMPRESSION: Soft tissue swelling/edema. No acute osseous abnormality identified. Electronically Signed   By: Genevie Ann M.D.   On: 11/14/2021 11:25   CT Angio Chest PE W/Cm &/Or Wo Cm  Result Date: 11/13/2021 CLINICAL DATA:  PE suspected, left calf pain and swelling EXAM: CT ANGIOGRAPHY CHEST WITH CONTRAST TECHNIQUE: Multidetector CT imaging of the chest was performed using the standard protocol during bolus administration of intravenous contrast. Multiplanar CT image reconstructions and MIPs were obtained to evaluate the vascular anatomy. RADIATION DOSE REDUCTION: This exam was performed according to the departmental dose-optimization program which includes automated exposure control, adjustment of the mA and/or kV according to patient size and/or use of iterative reconstruction technique. CONTRAST:  49mL OMNIPAQUE IOHEXOL 350 MG/ML SOLN COMPARISON:  None. FINDINGS: Cardiovascular: Positive examination for pulmonary embolism, with a large burden of embolus present in the left and right pulmonary arteries as well  as in the lobar and segmental arteries bilaterally. Mild, global cardiomegaly. RV LV ratio is preserved, less than 0.9. Main pulmonary artery is normal in caliber. No pericardial effusion. Mediastinum/Nodes: No enlarged mediastinal, hilar, or axillary lymph nodes. Thyroid gland, trachea, and esophagus demonstrate no significant findings. Lungs/Pleura: Multiple subpleural ground-glass airspace opacities with some evidence of central clearing, most conspicuously in the lingula (series 6, image 52) and posterior right upper lobe (series 6, image 32). No pleural effusion or pneumothorax. Upper Abdomen: No acute abnormality.  Hepatic steatosis. Musculoskeletal: No chest wall abnormality. No acute osseous findings. Review of the MIP images confirms the above  findings. IMPRESSION: 1. Positive examination for pulmonary embolism, with a large burden of embolus present in the left and right pulmonary arteries as well as in the lobar and segmental arteries bilaterally. 2. Mild, global cardiomegaly. RV LV ratio is preserved, less than 0.9. Main pulmonary artery is normal in caliber. 3. Multiple bilateral subpleural ground-glass airspace opacities with some evidence of central clearing, most conspicuously in the lingula and posterior right upper lobe. Findings are consistent with multifocal pulmonary infarction in the setting of pulmonary embolism. 4. Hepatic steatosis. These results were called by telephone at the time of interpretation on 11/13/2021 at 3:53 pm to Dr. Blake Divine , who verbally acknowledged these results. Electronically Signed   By: Delanna Ahmadi M.D.   On: 11/13/2021 15:57   US Venous Img Lower Unilateral Left  Result Date: 11/13/2021 CLINICAL DATA:  Left leg pain and swelling EXAM: Left LOWER EXTREMITY VENOUS DOPPLER ULTRASOUND TECHNIQUE: Gray-scale sonography with compression, as well as color and duplex ultrasound, were performed to evaluate the deep venous system(s) from the level of the common femoral vein through the popliteal and proximal calf veins. COMPARISON:  None. FINDINGS: VENOUS Normal compressibility of the common femoral, superficial femoral, and profunda veins. Hypoechoic noncompressible occlusive thrombus in the popliteal vein, peroneal vein and posterior tibial veins. Limited views of the contralateral common femoral vein are unremarkable. OTHER None. Limitations: none IMPRESSION: Acute occlusive DVT in the left popliteal, posterior tibial, peroneal veins. Electronically Signed   By: Ofilia Neas M.D.   On: 11/13/2021 14:24      Damiah Mcdonald T. [place]  If 7PM-7AM, please contact night-coverage [email or web address] 11/14/2021, 11:42 AM

## 2021-11-15 ENCOUNTER — Other Ambulatory Visit (HOSPITAL_COMMUNITY): Payer: Self-pay

## 2021-11-15 LAB — CBC
HCT: 32.8 % — ABNORMAL LOW (ref 36.0–46.0)
Hemoglobin: 11.4 g/dL — ABNORMAL LOW (ref 12.0–15.0)
MCH: 29.3 pg (ref 26.0–34.0)
MCHC: 34.8 g/dL (ref 30.0–36.0)
MCV: 84.3 fL (ref 80.0–100.0)
Platelets: 160 10*3/uL (ref 150–400)
RBC: 3.89 MIL/uL (ref 3.87–5.11)
RDW: 13 % (ref 11.5–15.5)
WBC: 9 10*3/uL (ref 4.0–10.5)
nRBC: 0 % (ref 0.0–0.2)

## 2021-11-15 LAB — RENAL FUNCTION PANEL
Albumin: 3.1 g/dL — ABNORMAL LOW (ref 3.5–5.0)
Anion gap: 8 (ref 5–15)
BUN: 15 mg/dL (ref 6–20)
CO2: 30 mmol/L (ref 22–32)
Calcium: 8.7 mg/dL — ABNORMAL LOW (ref 8.9–10.3)
Chloride: 98 mmol/L (ref 98–111)
Creatinine, Ser: 0.66 mg/dL (ref 0.44–1.00)
GFR, Estimated: >60 mL/min (ref >60)
Glucose, Bld: 143 mg/dL — ABNORMAL HIGH (ref 70–99)
Phosphorus: 4.2 mg/dL (ref 2.5–4.6)
Potassium: 3.1 mmol/L — ABNORMAL LOW (ref 3.5–5.1)
Sodium: 136 mmol/L (ref 135–145)

## 2021-11-15 LAB — MAGNESIUM: Magnesium: 2.2 mg/dL (ref 1.7–2.4)

## 2021-11-15 LAB — HEPARIN LEVEL (UNFRACTIONATED)
Heparin Unfractionated: 0.4 IU/mL (ref 0.30–0.70)
Heparin Unfractionated: 0.4 IU/mL (ref 0.30–0.70)

## 2021-11-15 MED ORDER — APIXABAN 5 MG PO TABS: 5.0000 mg | ORAL_TABLET | Freq: Two times a day (BID) | ORAL | Status: DC

## 2021-11-15 MED ORDER — APIXABAN 5 MG PO TABS: 5.0000 mg | ORAL_TABLET | Freq: Two times a day (BID) | ORAL | 2 refills | Status: DC

## 2021-11-15 MED ORDER — POTASSIUM CHLORIDE CRYS ER 20 MEQ PO TBCR
40.0000 meq | EXTENDED_RELEASE_TABLET | Freq: Once | ORAL | Status: AC
  Administered 2021-11-15: 05:00:00 40 meq via ORAL
  Filled 2021-11-15: qty 2

## 2021-11-15 MED ORDER — APIXABAN (ELIQUIS) VTE STARTER PACK (10MG AND 5MG): ORAL_TABLET | ORAL | 0 refills | Status: DC

## 2021-11-15 MED ORDER — POTASSIUM CHLORIDE CRYS ER 20 MEQ PO TBCR
40.0000 meq | EXTENDED_RELEASE_TABLET | ORAL | Status: AC
  Administered 2021-11-15 (×2): 40 meq via ORAL
  Filled 2021-11-15 (×2): qty 2

## 2021-11-15 MED ORDER — APIXABAN 5 MG PO TABS
10.0000 mg | ORAL_TABLET | Freq: Two times a day (BID) | ORAL | Status: DC
  Administered 2021-11-15: 08:00:00 10 mg via ORAL
  Filled 2021-11-15: qty 2

## 2021-11-15 MED ORDER — BENZONATATE 100 MG PO CAPS: 100.0000 mg | ORAL_CAPSULE | Freq: Two times a day (BID) | ORAL | 0 refills | Status: DC

## 2021-11-15 NOTE — Evaluation (Signed)
Physical Therapy Evaluation Patient Details Name: Krista Blake MRN: [id number] DOB: 04-19-1966 Today's Date: 11/15/2021  History of Present Illness  56 year old F with PMH of morbid obesity, HTN, left knee septic arthritis/osteomyelitis with MSSA s/p arthroscopy on 08/08/2021 for which she was discharged on IV Ancef through 09/20/2021, and currently on p.o. Keflex, presenting with left lateral pleuritic chest pain with shortness of breath and LLE edema/swelling, and found to have bilateral PE with possible multifocal pulmonary infarction and LLE DVT.   Clinical Impression  Pt received supine in bed, OT in room obtaining PLOF information; pt states she is tired however agreeable to therapy. Pt did become emotional during session stating she is nervous about going home with the clots not fully dissolved; PT encouraged pt to follow up with her personal care team (outside of the hospital) to provide peace of mind. Pt performed bed mobility, STS and short distance ambulation using RW (within room) with modified independence. SUP provided during ambulation of increased distance, >167f, due to labored breathing and pt focus changing from ambulation to the pain related to deep breathes. PT educated pt on energy conservation , PLB and pacing activities. Pt able to safely ambulate using unilateral crutch and bilateral crutches. PT is recommending pt d/c home and return to OP PT to rehab LLE (gait pattern and wean from AD) and challenge cardiopulmonary endurance.      Recommendations for follow up therapy are one component of a multi-disciplinary discharge planning process, led by the attending physician.  Recommendations may be updated based on patient status, additional functional criteria and insurance authorization.  Follow Up Recommendations Outpatient PT (continue OP PT for rehab of L knee and cardiopulm endurance)    Assistance Recommended at Discharge PRN  Patient can return home with the  following  Help with stairs or ramp for entrance;Assistance with cooking/housework    Equipment Recommendations None recommended by PT  Recommendations for Other Services       Functional Status Assessment Patient has had a recent decline in their functional status and demonstrates the ability to make significant improvements in function in a reasonable and predictable amount of time.     Precautions / Restrictions Precautions Precautions: None Restrictions Weight Bearing Restrictions: No      Mobility  Bed Mobility Overal bed mobility: Modified Independent                  Transfers Overall transfer level: Modified independent                 General transfer comment: STS from bed and toilet    Ambulation/Gait Ambulation/Gait assistance: Supervision Gait Distance (Feet): 180 Feet Assistive device: Crutches Gait Pattern/deviations: Step-through pattern, Decreased weight shift to left, Decreased stance time - left, Antalgic       General Gait Details: 174f(RW) + 1587fRW) + 180f35frutches). Decreased gait velocity; guarded with LLE WB leading to antalgic pattern. MOD I with RW; MOD I with bilateral crutches; SUP with unilateral crutch - removed due to L shoulder pain. Fatigue and mild SOB with increased distance.  Stairs            Wheelchair Mobility    Modified Rankin (Stroke Patients Only)       Balance Overall balance assessment: No apparent balance deficits (not formally assessed), Modified Independent  Pertinent Vitals/Pain Pain Assessment Pain Assessment: Faces Faces Pain Scale: Hurts even more Pain Location: L shoulder/chest/back with labored breathing Pain Intervention(s): Limited activity within patient's tolerance, Monitored during session    [place] expects to be discharged to:: Private residence Living Arrangements: Parent Available Help at  Discharge: Family;Available PRN/intermittently (Family works.) Type of Home: House Home Access: Stairs to enter Entrance Stairs-Rails: None Technical brewer of Steps: 2   Home Layout: Two level;Able to live on main level with bedroom/bathroom Home Equipment: Rollator (4 wheels);Crutches;Shower seat - built Medical sales representative (2 wheels) Additional Comments: patient was getting outpatient PT most recently    Prior Function Prior Level of Function : Independent/Modified Independent;Driving;Working/employed                     Hand Dominance        Extremity/Trunk Assessment   Upper Extremity Assessment Upper Extremity Assessment: Overall WFL for tasks assessed    Lower Extremity Assessment Lower Extremity Assessment: Overall WFL for tasks assessed;LLE deficits/detail LLE Deficits / Details: multiple recent surgeries - MMT not formally assessed       Communication   Communication: No difficulties  Cognition Arousal/Alertness: Awake/alert Behavior During Therapy: WFL for tasks assessed/performed Overall Cognitive Status: Within Functional Limits for tasks assessed                                          General Comments      Exercises     Assessment/Plan    PT Assessment All further PT needs can be met in the next venue of care  PT Problem List Decreased mobility;Decreased activity tolerance;Cardiopulmonary status limiting activity       PT Treatment Interventions      PT Goals (Current goals can be found in the Care Plan section)  Acute Rehab PT Goals Patient Stated Goal: no more hospitalizations PT Goal Formulation: With patient Time For Goal Achievement: 11/29/21 Potential to Achieve Goals: Good    Frequency       Co-evaluation               AM-PAC PT "6 Clicks" Mobility  Outcome Measure Help needed turning from your back to your side while in a flat bed without using bedrails?: None Help needed moving from lying  on your back to sitting on the side of a flat bed without using bedrails?: None Help needed moving to and from a bed to a chair (including a wheelchair)?: None Help needed standing up from a chair using your arms (e.g., wheelchair or bedside chair)?: None Help needed to walk in hospital room?: None Help needed climbing 3-5 steps with a railing? : A Little 6 Click Score: 23    End of Session Equipment Utilized During Treatment: Gait belt Activity Tolerance: Patient tolerated treatment well;Patient limited by pain (pain with heavy breathing during ambulation) Patient left: in bed Nurse Communication: Mobility status;Precautions PT Visit Diagnosis: Other abnormalities of gait and mobility (R26.89);Difficulty in walking, not elsewhere classified (R26.2);Muscle weakness (generalized) (M62.81)    Time: 7106-2694 PT Time Calculation (min) (ACUTE ONLY): 32 min   Charges:   PT Evaluation $PT Eval Moderate Complexity: 1 Mod PT Treatments $Gait Training: 8-22 mins        Patrina Levering PT, DPT 11/15/21 11:16 AM [phone]

## 2021-11-15 NOTE — Discharge Summary (Signed)
Physician Discharge Summary  Krista Blake NIO:[id number] DOB: 1966/03/23 DOA: 11/13/2021  PCP: Idelle Crouch, MD  Admit date: 11/13/2021 Discharge date: 11/15/2021 Admitted From: Home Disposition: Home Recommendations for Outpatient Follow-up:  Follow ups as below. Please obtain CBC/BMP/Mag at follow up Please follow up on the following pending results: None Home Health: Not indicated Equipment/Devices: Patient has appropriate DME Discharge Condition: Stable CODE STATUS: Full code  Follow-up Information     Idelle Crouch, MD. Schedule an appointment as soon as possible for a visit in 1 week(s).   Specialty: Internal Medicine Contact information: [place] [id number] [place] Hospital Course: 56 year old F with PMH of morbid obesity, HTN, left knee septic arthritis/osteomyelitis with MSSA s/p arthroscopy on 08/08/2021 for which she was discharged on IV Ancef through 09/20/2021, and currently on p.o. Keflex, presenting with left lateral pleuritic chest pain with shortness of breath and LLE edema/swelling, and found to have bilateral PE with possible multifocal pulmonary infarction and LLE DVT.  RV/LV ratio of 0.9.  Started on IV heparin.  Patient received IV heparin for 48 hours.  Eventually transition to p.o. Eliquis.  Ambulated on room air and maintain appropriate saturation.  Discharged on starter pack Eliquis.   See individual problem list below for more on hospital course.  Discharge Diagnoses:  Acute pulmonary embolism with pulmonary infarction/left leg DVT-patient had left knee arthroscopic surgery in October.  At that time, she was discharged on Lovenox.  She says she was not excessively immobile but has been using crutches.  Not on HRT.  No prior personal or family history of blood clot.  No history of malignancy. Reports normal Cologuard.  No signs of significant RV strain.  On room air.  Hemodynamically stable  but significant pleuritic chest pain with deep breathing. -Transition to p.o. Eliquis-starter pack. -Check CBC at follow-up. -Ensure age-appropriate cancer screening   Hypokalemia: Resolved.  Likely due to HCTZ. -Received p.o. KCl 40x2 prior to discharge.   Mild hyponatremia: Resolved.   History of left knee septic arthritis/osteomyelitis s/p arthroscopic surgery on 08/08/2021-no evidence of active infection. -Completed IV Ancef through 09/20/2021.  Currently on p.o. Keflex -Outpatient follow-up with orthopedic surgery.   Left ankle swelling/tenderness-could be from DVT.  She has mild tenderness behind her left lateral malleolus.  No erythema or signs of obvious infection.  X-ray without acute finding.   Anxiety and depression: Stable. -Continue home Paxil.   Essential hypertension: Normotensive. -Continue home Inderal and HCTZ   Mild thrombocytopenia: Resolved.   Class II obesity Body mass index is 40.23 kg/m.           Discharge Exam: Vitals:   11/15/21 0513 11/15/21 0741 11/15/21 1130 11/15/21 1134  BP: 117/75 111/80  114/65  Pulse: 77 81  78  Temp: 98.2 F (36.8 C) 98 F (36.7 C)  98 F (36.7 C)  Resp: 18 20  16   Height:      Weight:      SpO2: 94% 94% 93% 97%  TempSrc: Oral     BMI (Calculated):         GENERAL: No apparent distress.  Nontoxic. HEENT: MMM.  Vision and hearing grossly intact.  NECK: Supple.  No apparent JVD.  RESP: 97% on RA.  No IWOB.  Fair aeration bilaterally. CVS:  RRR. Heart sounds normal.  ABD/GI/GU: Bowel sounds present. Soft. Non tender.  MSK/EXT:  Moves extremities.  No apparent deformity. No edema.  SKIN: no apparent skin lesion or wound NEURO: Awake and alert.  Oriented appropriately.  No apparent focal neuro deficit. PSYCH: Calm. Normal affect.   Discharge Instructions  Discharge Instructions     Call MD for:  difficulty breathing, headache or visual disturbances   Complete by: As directed    Call MD for:  extreme  fatigue   Complete by: As directed    Call MD for:  persistant dizziness or light-headedness   Complete by: As directed    Call MD for:  severe uncontrolled pain   Complete by: As directed    Call MD for:  temperature >100.4   Complete by: As directed    Diet - low sodium heart healthy   Complete by: As directed    Discharge instructions   Complete by: As directed    It has been a pleasure taking care of you!  You were hospitalized due to pulmonary embolism (blood clot in your lungs) and DVT (blood clot in your left leg).  We have started you on blood thinner.  It is very important that you take this medication as prescribed.  We strongly recommend you avoid any over-the-counter pain medication other than plain Tylenol while taking blood thinner.  Please review your new medication list and the directions on your medications before you take them.  Follow-up with your primary care doctor in 1 to 2 weeks or sooner if needed   Take care,   Increase activity slowly   Complete by: As directed       Allergies as of 11/15/2021       Reactions   Penicillins Hives   Reaction : Hives during college TOLERATED CEFAZOLIN AND AMOXICILLIN        Medication List     TAKE these medications    acetaminophen 650 MG CR tablet Commonly known as: TYLENOL Take 1,300 mg by mouth every 8 (eight) hours as needed for pain.   allopurinol 100 MG tablet Commonly known as: ZYLOPRIM Take 100 mg by mouth daily.   Apixaban Starter Pack (10mg  and 5mg ) Commonly known as: ELIQUIS STARTER PACK Take as directed on package: start with two-5mg  tablets twice daily for 7 days. On day 8, switch to one-5mg  tablet twice daily.   apixaban 5 MG Tabs tablet Commonly known as: ELIQUIS Take 1 tablet (5 mg total) by mouth 2 (two) times daily. Start after you finish starter pack Start taking on: December 15, 2021   benzonatate 100 MG capsule Commonly known as: TESSALON Take 1 capsule (100 mg total) by mouth 2  (two) times daily.   calcium gluconate 500 MG tablet Take 1 tablet by mouth daily.   cephALEXin 500 MG capsule Commonly known as: KEFLEX Take 500 mg by mouth 4 (four) times daily.   hydrochlorothiazide 25 MG tablet Commonly known as: HYDRODIURIL Take 25 mg by mouth daily.   HYDROcodone-acetaminophen 5-325 MG tablet Commonly known as: NORCO/VICODIN Take 1-2 tablets by mouth every 4 (four) hours as needed for moderate pain or severe pain.   Magnesium 250 MG Tabs Take 250 mg by mouth daily.   methocarbamol 750 MG tablet Commonly known as: ROBAXIN Take 1 tablet (750 mg total) by mouth every 8 (eight) hours as needed for muscle spasms.   PARoxetine 20 MG tablet Commonly known as: PAXIL Take 20 mg by mouth daily.   propranolol 40 MG tablet Commonly known as: INDERAL Take 20 mg by mouth daily.   rosuvastatin 10  MG tablet Commonly known as: CRESTOR Take 10 mg by mouth daily.   Vitamin D3 1.25 MG (50000 UT) Caps Take 1 capsule by mouth daily.        Consultations: None  Procedures/Studies:   DG Ankle Complete Left  Result Date: 11/14/2021 CLINICAL DATA:  56 year old female with pulmonary embolus, left lower extremity DVT, left leg swelling. EXAM: LEFT ANKLE COMPLETE - 3+ VIEW COMPARISON:  Left lower extremity Doppler ultrasound 11/13/2021. FINDINGS: Widespread soft tissue swelling and stranding, more so medial. Small chronic ossific fragment adjacent to the medial malleolus. Preserved mortise joint alignment. No definite joint effusion. No acute osseous abnormality identified. Calcaneus degenerative spurring. IMPRESSION: Soft tissue swelling/edema. No acute osseous abnormality identified. Electronically Signed   By: Genevie Ann M.D.   On: 11/14/2021 11:25   CT Angio Chest PE W/Cm &/Or Wo Cm  Result Date: 11/13/2021 CLINICAL DATA:  PE suspected, left calf pain and swelling EXAM: CT ANGIOGRAPHY CHEST WITH CONTRAST TECHNIQUE: Multidetector CT imaging of the chest was performed  using the standard protocol during bolus administration of intravenous contrast. Multiplanar CT image reconstructions and MIPs were obtained to evaluate the vascular anatomy. RADIATION DOSE REDUCTION: This exam was performed according to the departmental dose-optimization program which includes automated exposure control, adjustment of the mA and/or kV according to patient size and/or use of iterative reconstruction technique. CONTRAST:  57mL OMNIPAQUE IOHEXOL 350 MG/ML SOLN COMPARISON:  None. FINDINGS: Cardiovascular: Positive examination for pulmonary embolism, with a large burden of embolus present in the left and right pulmonary arteries as well as in the lobar and segmental arteries bilaterally. Mild, global cardiomegaly. RV LV ratio is preserved, less than 0.9. Main pulmonary artery is normal in caliber. No pericardial effusion. Mediastinum/Nodes: No enlarged mediastinal, hilar, or axillary lymph nodes. Thyroid gland, trachea, and esophagus demonstrate no significant findings. Lungs/Pleura: Multiple subpleural ground-glass airspace opacities with some evidence of central clearing, most conspicuously in the lingula (series 6, image 52) and posterior right upper lobe (series 6, image 32). No pleural effusion or pneumothorax. Upper Abdomen: No acute abnormality.  Hepatic steatosis. Musculoskeletal: No chest wall abnormality. No acute osseous findings. Review of the MIP images confirms the above findings. IMPRESSION: 1. Positive examination for pulmonary embolism, with a large burden of embolus present in the left and right pulmonary arteries as well as in the lobar and segmental arteries bilaterally. 2. Mild, global cardiomegaly. RV LV ratio is preserved, less than 0.9. Main pulmonary artery is normal in caliber. 3. Multiple bilateral subpleural ground-glass airspace opacities with some evidence of central clearing, most conspicuously in the lingula and posterior right upper lobe. Findings are consistent with  multifocal pulmonary infarction in the setting of pulmonary embolism. 4. Hepatic steatosis. These results were called by telephone at the time of interpretation on 11/13/2021 at 3:53 pm to Dr. Blake Divine , who verbally acknowledged these results. Electronically Signed   By: Delanna Ahmadi M.D.   On: 11/13/2021 15:57   US Venous Img Lower Unilateral Left  Result Date: 11/13/2021 CLINICAL DATA:  Left leg pain and swelling EXAM: Left LOWER EXTREMITY VENOUS DOPPLER ULTRASOUND TECHNIQUE: Gray-scale sonography with compression, as well as color and duplex ultrasound, were performed to evaluate the deep venous system(s) from the level of the common femoral vein through the popliteal and proximal calf veins. COMPARISON:  None. FINDINGS: VENOUS Normal compressibility of the common femoral, superficial femoral, and profunda veins. Hypoechoic noncompressible occlusive thrombus in the popliteal vein, peroneal vein and posterior tibial veins. Limited views of  the contralateral common femoral vein are unremarkable. OTHER None. Limitations: none IMPRESSION: Acute occlusive DVT in the left popliteal, posterior tibial, peroneal veins. Electronically Signed   By: Ofilia Neas M.D.   On: 11/13/2021 14:24       The results of significant diagnostics from this hospitalization (including imaging, microbiology, ancillary and laboratory) are listed below for reference.     Microbiology: Recent Results (from the past 240 hour(s))  Resp Panel by RT-PCR (Flu A&B, Covid) Nasopharyngeal Swab     Status: None   Collection Time: 11/13/21  4:35 PM   Specimen: Nasopharyngeal Swab; Nasopharyngeal(NP) swabs in vial transport medium  Result Value Ref Range Status   SARS Coronavirus 2 by RT PCR NEGATIVE NEGATIVE Final    Comment: (NOTE) SARS-CoV-2 target nucleic acids are NOT DETECTED.  The SARS-CoV-2 RNA is generally detectable in upper respiratory specimens during the acute phase of infection. The lowest concentration  of SARS-CoV-2 viral copies this assay can detect is 138 copies/mL. A negative result does not preclude SARS-Cov-2 infection and should not be used as the sole basis for treatment or other patient management decisions. A negative result may occur with  improper specimen collection/handling, submission of specimen other than nasopharyngeal swab, presence of viral mutation(s) within the areas targeted by this assay, and inadequate number of viral copies(<138 copies/mL). A negative result must be combined with clinical observations, patient history, and epidemiological information. The expected result is Negative.  Fact Sheet for Patients:  EntrepreneurPulse.com.au  Fact Sheet for Healthcare Providers:  IncredibleEmployment.be  This test is no t yet approved or cleared by the [place] FDA and  has been authorized for detection and/or diagnosis of SARS-CoV-2 by FDA under an Emergency Use Authorization (EUA). This EUA will remain  in effect (meaning this test can be used) for the duration of the COVID-19 declaration under Section 564(b)(1) of the Act, 21 U.S.C.section 360bbb-3(b)(1), unless the authorization is terminated  or revoked sooner.       Influenza A by PCR NEGATIVE NEGATIVE Final   Influenza B by PCR NEGATIVE NEGATIVE Final    Comment: (NOTE) The Xpert Xpress SARS-CoV-2/FLU/RSV plus assay is intended as an aid in the diagnosis of influenza from Nasopharyngeal swab specimens and should not be used as a sole basis for treatment. Nasal washings and aspirates are unacceptable for Xpert Xpress SARS-CoV-2/FLU/RSV testing.  Fact Sheet for Patients: EntrepreneurPulse.com.au  Fact Sheet for Healthcare Providers: IncredibleEmployment.be  This test is not yet approved or cleared by the [place] FDA and has been authorized for detection and/or diagnosis of SARS-CoV-2 by FDA under an Emergency Use  Authorization (EUA). This EUA will remain in effect (meaning this test can be used) for the duration of the COVID-19 declaration under Section 564(b)(1) of the Act, 21 U.S.C. section 360bbb-3(b)(1), unless the authorization is terminated or revoked.  Performed at [facility], [address] [place], [place] [address]      Labs:  CBC: Recent Labs  Lab 11/13/21 1403 11/14/21 0600 11/15/21 0311  WBC 9.5 9.1 9.0  NEUTROABS 6.5  --   --   HGB 13.1 11.8* 11.4*  HCT 38.6 34.3* 32.8*  MCV 84.8 85.1 84.3  PLT 153 144* 160   BMP &GFR Recent Labs  Lab 11/13/21 1403 11/14/21 0600 11/15/21 0311  NA 134* 133* 136  K 3.4* 3.8 3.1*  CL 96* 96* 98  CO2 27 30 30   GLUCOSE 148* 168* 143*  BUN 12 10 15   CREATININE 0.64 0.64 0.66  CALCIUM 9.1 8.9 8.7*  MG  --   --  2.2  PHOS  --   --  4.2   Estimated Creatinine Clearance: 95.6 mL/min (by C-G formula based on SCr of 0.66 mg/dL). Liver & Pancreas: Recent Labs  Lab 11/15/21 0311  ALBUMIN 3.1*   No results for input(s): LIPASE, AMYLASE in the last 168 hours. No results for input(s): AMMONIA in the last 168 hours. Diabetic: No results for input(s): HGBA1C in the last 72 hours. No results for input(s): GLUCAP in the last 168 hours. Cardiac Enzymes: No results for input(s): CKTOTAL, CKMB, CKMBINDEX, TROPONINI in the last 168 hours. No results for input(s): PROBNP in the last 8760 hours. Coagulation Profile: Recent Labs  Lab 11/14/21 0102  INR 1.1   Thyroid Function Tests: No results for input(s): TSH, T4TOTAL, FREET4, T3FREE, THYROIDAB in the last 72 hours. Lipid Profile: No results for input(s): CHOL, HDL, LDLCALC, TRIG, CHOLHDL, LDLDIRECT in the last 72 hours. Anemia Panel: No results for input(s): VITAMINB12, FOLATE, FERRITIN, TIBC, IRON, RETICCTPCT in the last 72 hours. Urine analysis:    Component Value Date/Time   COLORURINE YELLOW 10/02/2015 [place] 10/02/2015 0824   LABSPEC 1.020  10/02/2015 0824   PHURINE 7.5 10/02/2015 0824   GLUCOSEU NEGATIVE 10/02/2015 0824   HGBUR TRACE (A) 10/02/2015 0824   BILIRUBINUR 1+ (A) 10/02/2015 0824   KETONESUR NEGATIVE 10/02/2015 0824   PROTEINUR PRESENT (A) 10/02/2015 0824   NITRITE NEGATIVE 10/02/2015 0824   LEUKOCYTESUR NEGATIVE 10/02/2015 0824   Sepsis Labs: Invalid input(s): PROCALCITONIN, LACTICIDVEN   Time coordinating discharge: 45 minutes  SIGNED:  Mercy Riding, MD  [facility] 11/15/2021, 3:36 PM

## 2021-11-15 NOTE — Evaluation (Signed)
Occupational Therapy Evaluation Patient Details Name: Jamayah Myszka MRN: [id number] DOB: 11-21-1965 Today's Date: 11/15/2021   History of Present Illness Jeanne Diefendorf is a 56 y.o. female with PMH of morbid obesity, HTN, left knee septic arthritis/osteomyelitis with MSSA s/p arthroscopy on 08/08/2021 for which she was discharged on IV Ancef through 09/20/2021, and currently on p.o. Keflex who presents to the ED with increasing LLE pain and swelling x6 days with some chest pain and difficulty breathing. LLE [place] is positive for DVT and CTA shows significant bilateral burden of pulmonary embolism. Pt admitted for further management. IV heaparin bolus initiated as of 1716 on 11/13/21.   Clinical Impression   Ms. Kuhlmann was seen for OT evaluation this date. Prior to hospital admission, pt was modified independent with ADL/IADL management, using crutches (community mobility) or a 4ww (household mobility), and denies falls history in past 12 months. Pt lives with her parents in a 2 story home with 2 steps to enter. Currently pt endorsing ongoing pain with deep breathing and weakness. She performs bed/functional mobility and toileting with modified independence using a RW. Pt demonstrates baseline independence to perform ADL and mobility tasks. Pt notably SOB with functional activity. Educated on activity pacing and pursed lip breathing to maximize safety and functional independence with ADL management. She return verbalizes understanding. No further skilled OT needs identified. Will sign off. Please re-consult if additional OT needs arise during this admission. Do not anticipate the need for follow-up OT services.        Recommendations for follow up therapy are one component of a multi-disciplinary discharge planning process, led by the attending physician.  Recommendations may be updated based on patient status, additional functional criteria and insurance authorization.   Follow Up  Recommendations  No OT follow up    Assistance Recommended at Discharge PRN  Patient can return home with the following      Functional Status Assessment  Patient has not had a recent decline in their functional status  Equipment Recommendations       Recommendations for Other Services       Precautions / Restrictions Precautions Precautions: None Restrictions Weight Bearing Restrictions: No      Mobility Bed Mobility Overal bed mobility: Modified Independent                  Transfers Overall transfer level: Modified independent Equipment used: Rolling walker (2 wheels)               General transfer comment: STS from bed and toilet      Balance Overall balance assessment: No apparent balance deficits (not formally assessed), Modified Independent                                         ADL either performed or assessed with clinical judgement   ADL Overall ADL's : At baseline                                       General ADL Comments: Pt presents at or near baseline leve of functional independence for ADL management. She performs functional mobility and toileting in room using a RW, no assist required. Pt notably SOB with functional activity. Educated on activity pacing and pursed lip breathing to maximize safety and functional independence with ADL  management.     Vision Patient Visual Report: No change from baseline       Perception     Praxis      Pertinent Vitals/Pain Pain Assessment Pain Score: 10-Worst pain ever Pain Location: L shoulder/chest/back with labored breathing Pain Descriptors / Indicators: Sharp, Stabbing Pain Intervention(s): Limited activity within patient's tolerance, Monitored during session, Repositioned, Utilized relaxation techniques, Patient requesting pain meds-RN notified     Hand Dominance     Extremity/Trunk Assessment Upper Extremity Assessment Upper Extremity Assessment:  Overall WFL for tasks assessed   Lower Extremity Assessment Lower Extremity Assessment: Overall WFL for tasks assessed;Defer to PT evaluation LLE Deficits / Details: multiple recent surgeries - MMT not formally assessed       Communication Communication Communication: No difficulties   Cognition Arousal/Alertness: Awake/alert Behavior During Therapy: WFL for tasks assessed/performed Overall Cognitive Status: Within Functional Limits for tasks assessed                                       General Comments       Exercises Other Exercises Other Exercises: Pt educated on role of OT in acute setting, falls prevention strategies including safe use of AE/DME for ADL management, and energy conservation strategies to support safety and functional independence during ADL management.   Shoulder Instructions      Home Living Family/patient expects to be discharged to:: Private residence Living Arrangements: Parent Available Help at Discharge: Family;Available PRN/intermittently Type of Home: House Home Access: Stairs to enter [employer] of Steps: 2 Entrance Stairs-Rails: None Home Layout: Two level;Able to live on main level with bedroom/bathroom     Bathroom Shower/Tub: Occupational psychologist: Standard     Home Equipment: Rollator (4 wheels);Crutches;Shower seat - built Medical sales representative (2 wheels)   Additional Comments: patient was getting outpatient PT most recently      Prior Functioning/Environment Prior Level of Function : Independent/Modified Independent;Driving;Working/employed                        OT Problem List: Pain;Cardiopulmonary status limiting activity;Decreased safety awareness;Decreased knowledge of use of DME or AE      OT Treatment/Interventions:      OT Goals(Current goals can be found in the care plan section) Acute Rehab OT Goals Patient Stated Goal: To feel better. OT Goal Formulation: All  assessment and education complete, DC therapy Time For Goal Achievement: 11/15/21 Potential to Achieve Goals: Good  OT Frequency:      Co-evaluation              AM-PAC OT "6 Clicks" Daily Activity     Outcome Measure Help from another person eating meals?: None Help from another person taking care of personal grooming?: None Help from another person toileting, which includes using toliet, bedpan, or urinal?: None Help from another person bathing (including washing, rinsing, drying)?: None Help from another person to put on and taking off regular upper body clothing?: None Help from another person to put on and taking off regular lower body clothing?: None 6 Click Score: 24   End of Session Equipment Utilized During Treatment: Rolling walker (2 wheels) Nurse Communication: Mobility status;Other (comment) (O2 sats with mobility)  Activity Tolerance: Patient tolerated treatment well;No increased pain Patient left: in bed (Seated EOB with PT in room to finish session.)  OT Visit Diagnosis: Other abnormalities  of gait and mobility (R26.89)                Time: 1005-1025 OT Time Calculation (min): 20 min Charges:  OT General Charges $OT Visit: 1 Visit OT Evaluation $OT Eval Low Complexity: 1 Low OT Treatments $Self Care/Home Management : 8-22 mins  Shara Blazing, M.S., OTR/L [facility] - [facility] Ascom: [phone] 11/15/21, 11:51 AM

## 2021-11-15 NOTE — TOC Benefit Eligibility Note (Signed)
Patient Teacher, English as a foreign language completed.    The patient is currently admitted and upon discharge could be taking Eliquis 5 mg.  The current 30 day co-pay is, $35.00.   The patient is insured through [employer] [place], [place] Patient Advocate Specialist [place] Patient Advocate Team Direct Number: [phone]  Fax: [phone]

## 2021-11-15 NOTE — Consult Note (Signed)
[place] for Apixaban Indication: pulmonary embolus and DVT  Patient Measurements: Height: 5\' 4"  (162.6 cm) Weight: 106.3 kg (234 lb 6.4 oz) IBW/kg (Calculated) : 54.7  Labs: Recent Labs    11/13/21 1403 11/13/21 1403 11/13/21 1635 11/14/21 0102 11/14/21 0600 11/14/21 1006 11/14/21 1806 11/15/21 0101 11/15/21 0311  HGB 13.1  --   --   --  11.8*  --   --   --  11.4*  HCT 38.6  --   --   --  34.3*  --   --   --  32.8*  PLT 153  --   --   --  144*  --   --   --  160  APTT  --   --   --  40*  --   --   --   --   --   LABPROT  --   --   --  13.9  --   --   --   --   --   INR  --   --   --  1.1  --   --   --   --   --   HEPARINUNFRC  --    < >  --  <0.10*  --  0.18* 0.26* 0.40  --   CREATININE 0.64  --   --   --  0.64  --   --   --  0.66  TROPONINIHS 5  --  5  --   --   --   --   --   --    < > = values in this interval not displayed.     Estimated Creatinine Clearance: 95.6 mL/min (by C-G formula based on SCr of 0.66 mg/dL).  Medications:  PTA : No anticoagulation or antiplatelet therapy prior to admission Inpatient: Heparin (1/22 >>1/24)      Apixaban (1/24 >>     ) Allergies: No AC/APT reported allergies  Assessment: 56 y.o. female with past medical history of hypertension and recurrent septic arthritis who presents to the ED with increasing LLE pain and swelling x6 days with some chest pain and difficulty breathing. LLE [place] is positive for DVT and CTA shows significant bilateral burden of pulmonary embolism. Pharmacy consulted to transition IV heparin to apixaban in the setting of DVT and pulmonary embolism.    Plan:  --Stop IV heparin --Start apixaban 10 mg BID x 7 days followed by apixaban 5 mg BID for remaining duration of acute therapy --CBC at least every 3 days per protocol  Krista Blake  11/15/2021 7:36 AM

## 2021-11-15 NOTE — Consult Note (Signed)
ANTICOAGULATION CONSULT NOTE  Pharmacy Consult for heparin Indication: pulmonary embolus and DVT  Patient Measurements: Height: 5\' 4"  (162.6 cm) Weight: 106.3 kg (234 lb 6.4 oz) IBW/kg (Calculated) : 54.7 Heparin Dosing Weight: 79.2kg  Labs: Recent Labs    11/13/21 1403 11/13/21 1635 11/14/21 0102 11/14/21 0102 11/14/21 0600 11/14/21 1006 11/14/21 1806 11/15/21 0101  HGB 13.1  --   --   --  11.8*  --   --   --   HCT 38.6  --   --   --  34.3*  --   --   --   PLT 153  --   --   --  144*  --   --   --   APTT  --   --  40*  --   --   --   --   --   LABPROT  --   --  13.9  --   --   --   --   --   INR  --   --  1.1  --   --   --   --   --   HEPARINUNFRC  --   --  <0.10*   < >  --  0.18* 0.26* 0.40  CREATININE 0.64  --   --   --  0.64  --   --   --   TROPONINIHS 5 5  --   --   --   --   --   --    < > = values in this interval not displayed.     Estimated Creatinine Clearance: 95.6 mL/min (by C-G formula based on SCr of 0.64 mg/dL).  Medications:  PTA : No anticoagulation or antiplatelet therapy prior to admission Inpatient: Heparin Drip (1/22 >>) Allergies: No AC/APT reported allergies   Date Time aPTT/HL       Rate/Comment 1/23 1006 0.18              1700 u/hr, subtherapeutic 1/24 0101 0.4  Therapeutic x 1  Baseline Labs: aPTT - pending INR -  pending Hgb - 13.1 Plts - 153  Assessment: 56 y.o. female with past medical history of hypertension and recurrent septic arthritis who presents to the ED with increasing LLE pain and swelling x6 days with some chest pain and difficulty breathing. LLE [place] is positive for DVT and CTA shows significant bilateral burden of pulmonary embolism. Pharmacy consulted for initiation and management of IV heparin in the setting of DVT and pulmonary embolism.   Goal of Therapy:  Heparin level 0.3-0.7 units/ml Monitor platelets by anticoagulation protocol: Yes   Plan:  --Will continue heparin drip rate at 2200 units/hr.  --Will recheck  HL 6 hrs to confirm --Daily CBC while on heparin drip per protocol  Renda Rolls, PharmD, [facility] 11/15/2021 2:06 AM

## 2021-11-15 NOTE — Progress Notes (Signed)
°  Transition of Care [facility]) Screening Note   Patient Details  Name: Krista Blake Date of Birth: 1965/10/29   Transition of Care [facility]) CM/SW Contact:    Alberteen Sam, LCSW Phone Number: 11/15/2021, 9:42 AM    Transition of Care Department [facility]) has reviewed patient and no TOC needs have been identified at this time. We will continue to monitor patient advancement through interdisciplinary progression rounds. If new patient transition needs arise, please place a TOC consult.  [place], [place]

## 2021-11-15 NOTE — Plan of Care (Signed)

## 2021-11-28 ENCOUNTER — Ambulatory Visit: Payer: BC Managed Care – PPO | Admitting: Dermatology

## 2021-12-05 ENCOUNTER — Emergency Department: Payer: BC Managed Care – PPO

## 2021-12-05 ENCOUNTER — Emergency Department
Admission: EM | Admit: 2021-12-05 | Discharge: 2021-12-05 | Disposition: A | Discharge status: Home / Self Care | Payer: BC Managed Care – PPO | Attending: Emergency Medicine | Admitting: Emergency Medicine

## 2021-12-05 ENCOUNTER — Other Ambulatory Visit: Payer: Self-pay

## 2021-12-05 DIAGNOSIS — Z6830 Body mass index (BMI) 30.0-30.9, adult: Secondary | ICD-10-CM | POA: Insufficient documentation

## 2021-12-05 DIAGNOSIS — E876 Hypokalemia: Secondary | ICD-10-CM | POA: Diagnosis not present

## 2021-12-05 DIAGNOSIS — Z20822 Contact with and (suspected) exposure to covid-19: Secondary | ICD-10-CM | POA: Insufficient documentation

## 2021-12-05 DIAGNOSIS — R112 Nausea with vomiting, unspecified: Secondary | ICD-10-CM | POA: Insufficient documentation

## 2021-12-05 DIAGNOSIS — R059 Cough, unspecified: Secondary | ICD-10-CM | POA: Insufficient documentation

## 2021-12-05 DIAGNOSIS — Z7901 Long term (current) use of anticoagulants: Secondary | ICD-10-CM | POA: Diagnosis not present

## 2021-12-05 DIAGNOSIS — R197 Diarrhea, unspecified: Secondary | ICD-10-CM | POA: Insufficient documentation

## 2021-12-05 DIAGNOSIS — R072 Precordial pain: Secondary | ICD-10-CM | POA: Diagnosis not present

## 2021-12-05 DIAGNOSIS — I10 Essential (primary) hypertension: Secondary | ICD-10-CM | POA: Insufficient documentation

## 2021-12-05 DIAGNOSIS — R7303 Prediabetes: Secondary | ICD-10-CM | POA: Insufficient documentation

## 2021-12-05 DIAGNOSIS — E669 Obesity, unspecified: Secondary | ICD-10-CM | POA: Diagnosis not present

## 2021-12-05 DIAGNOSIS — R1110 Vomiting, unspecified: Secondary | ICD-10-CM

## 2021-12-05 LAB — CBC WITH DIFFERENTIAL/PLATELET
Abs Immature Granulocytes: 0.03 10*3/uL (ref 0.00–0.07)
Basophils Absolute: 0 10*3/uL (ref 0.0–0.1)
Basophils Relative: 0 %
Eosinophils Absolute: 0.1 10*3/uL (ref 0.0–0.5)
Eosinophils Relative: 1 %
HCT: 43.2 % (ref 36.0–46.0)
Hemoglobin: 14.4 g/dL (ref 12.0–15.0)
Immature Granulocytes: 0 %
Lymphocytes Relative: 10 %
Lymphs Abs: 0.9 10*3/uL (ref 0.7–4.0)
MCH: 28.6 pg (ref 26.0–34.0)
MCHC: 33.3 g/dL (ref 30.0–36.0)
MCV: 85.7 fL (ref 80.0–100.0)
Monocytes Absolute: 0.6 10*3/uL (ref 0.1–1.0)
Monocytes Relative: 7 %
Neutro Abs: 6.9 10*3/uL (ref 1.7–7.7)
Neutrophils Relative %: 82 %
Platelets: 218 10*3/uL (ref 150–400)
RBC: 5.04 MIL/uL (ref 3.87–5.11)
RDW: 13.8 % (ref 11.5–15.5)
WBC: 8.5 10*3/uL (ref 4.0–10.5)
nRBC: 0 % (ref 0.0–0.2)

## 2021-12-05 LAB — COMPREHENSIVE METABOLIC PANEL
ALT: 19 U/L (ref 0–44)
AST: 24 U/L (ref 15–41)
Albumin: 4.4 g/dL (ref 3.5–5.0)
Alkaline Phosphatase: 93 U/L (ref 38–126)
Anion gap: 11 (ref 5–15)
BUN: 19 mg/dL (ref 6–20)
CO2: 25 mmol/L (ref 22–32)
Calcium: 9.5 mg/dL (ref 8.9–10.3)
Chloride: 100 mmol/L (ref 98–111)
Creatinine, Ser: 0.75 mg/dL (ref 0.44–1.00)
GFR, Estimated: >60 mL/min (ref >60)
Glucose, Bld: 162 mg/dL — ABNORMAL HIGH (ref 70–99)
Potassium: 2.8 mmol/L — ABNORMAL LOW (ref 3.5–5.1)
Sodium: 136 mmol/L (ref 135–145)
Total Bilirubin: 0.8 mg/dL (ref 0.3–1.2)
Total Protein: 7.8 g/dL (ref 6.5–8.1)

## 2021-12-05 LAB — TROPONIN I (HIGH SENSITIVITY): Troponin I (High Sensitivity): 4 ng/L (ref <18)

## 2021-12-05 LAB — URINALYSIS, COMPLETE (UACMP) WITH MICROSCOPIC
Bilirubin Urine: NEGATIVE
Glucose, UA: NEGATIVE mg/dL
Ketones, ur: NEGATIVE mg/dL
Leukocytes,Ua: NEGATIVE
Nitrite: NEGATIVE
Protein, ur: 30 mg/dL — AB
Specific Gravity, Urine: 1.027 (ref 1.005–1.030)
pH: 6 (ref 5.0–8.0)

## 2021-12-05 LAB — RESP PANEL BY RT-PCR (FLU A&B, COVID) ARPGX2
Influenza A by PCR: NEGATIVE
Influenza B by PCR: NEGATIVE
SARS Coronavirus 2 by RT PCR: NEGATIVE

## 2021-12-05 IMAGING — CT CT ANGIO CHEST
2 of 7 series · 17 of 46 positions shown · IV contrast (APPLIED)
Comparison: CT angiography chest [DATE]

CLINICAL DATA: Pulmonary embolism (PE) suspected, high prob

EXAM:
CT ANGIOGRAPHY CHEST WITH CONTRAST
TECHNIQUE: Multidetector CT imaging of the chest was performed using the
standard protocol during bolus administration of intravenous
contrast. Multiplanar CT image reconstructions and MIPs were
obtained to evaluate the vascular anatomy.

[Series 5: thins · axial · 0.84mm/px · z∈[-105,+138]mm · 15 of 275 slices shown]
[im 16/275  lung]
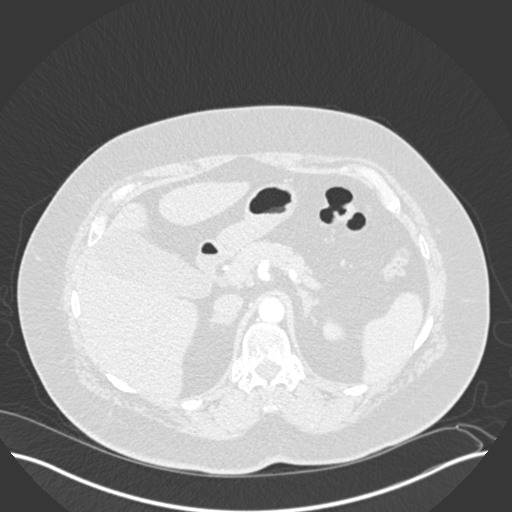
[im 31/275  soft-tissue]
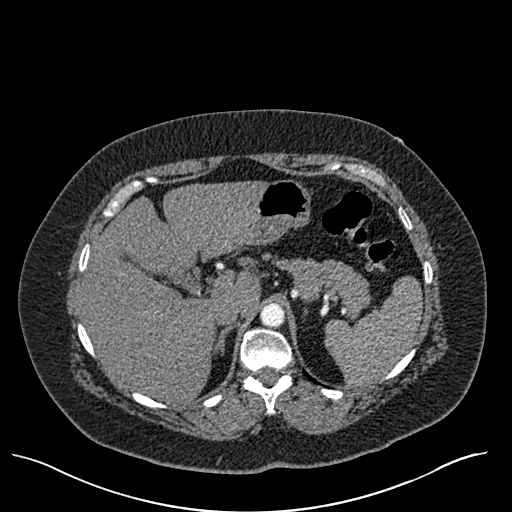
[im 46/275  lung]
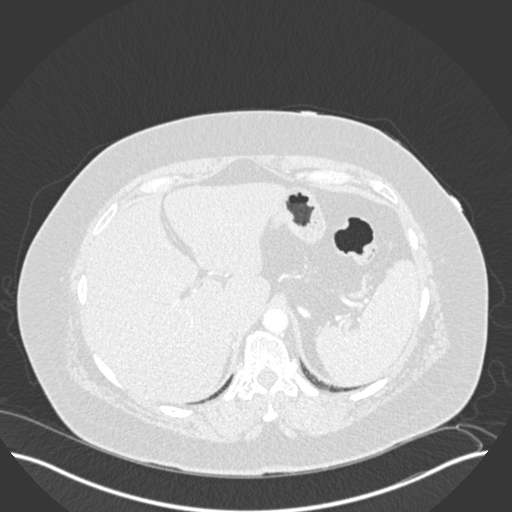
[im 61/275  soft-tissue]
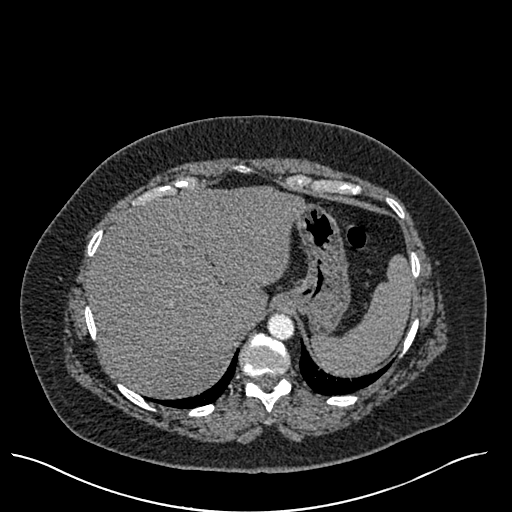
[im 92/275  lung]
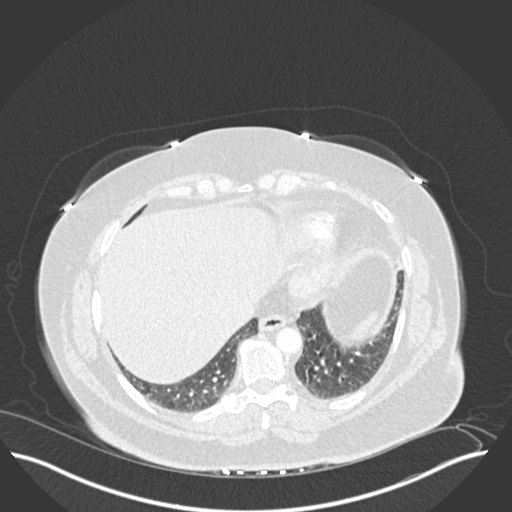
[im 107/275  soft-tissue]
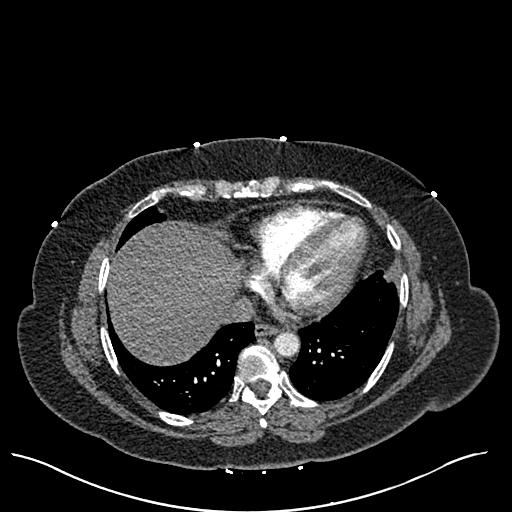
[im 122/275  lung]
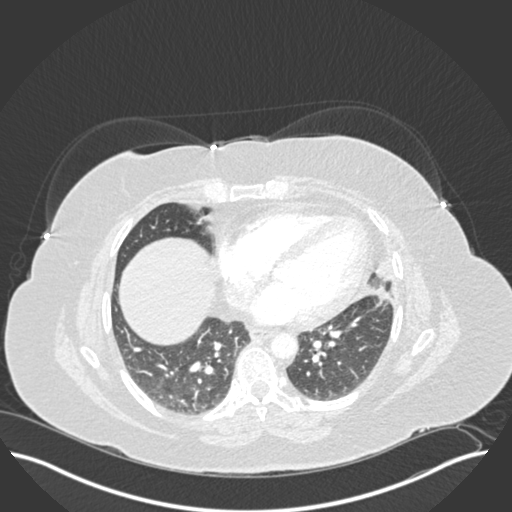
[im 138/275  soft-tissue]
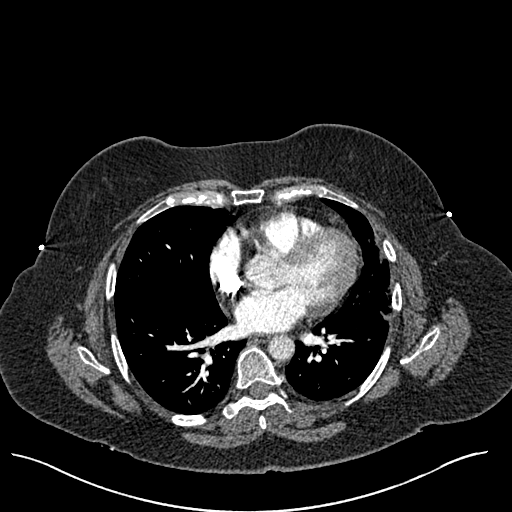
[im 153/275  lung]
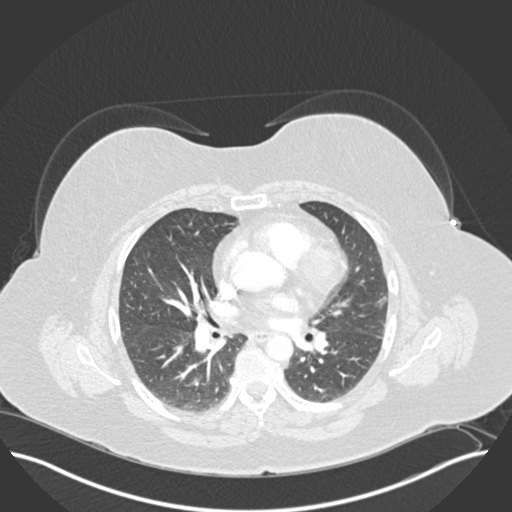
[im 168/275  soft-tissue]
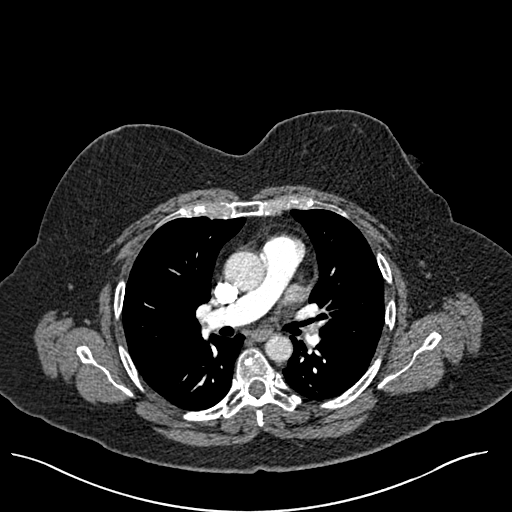
[im 183/275  lung]
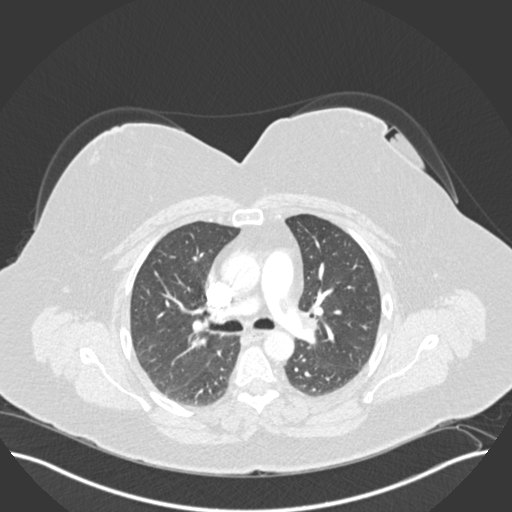
[im 214/275  soft-tissue]
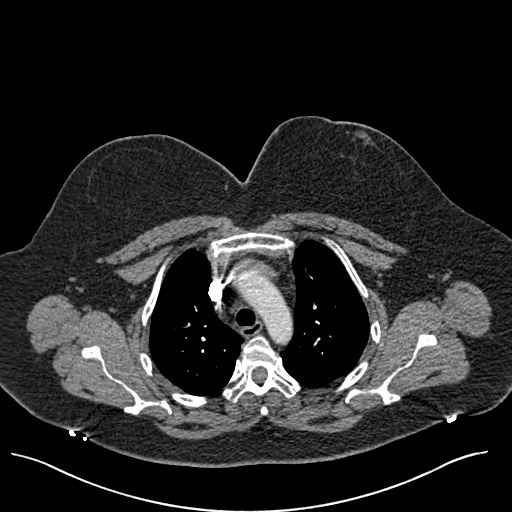
[im 229/275  lung]
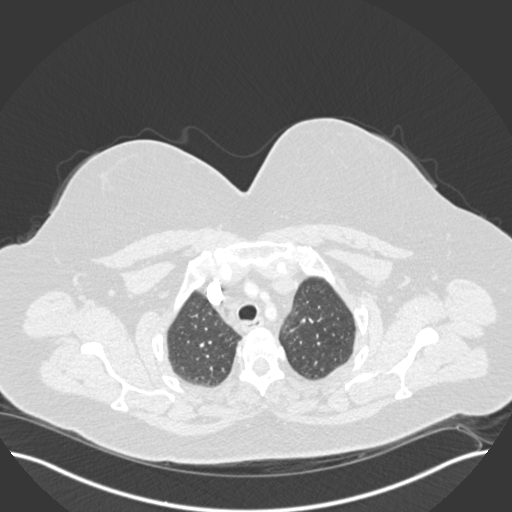
[im 244/275  soft-tissue]
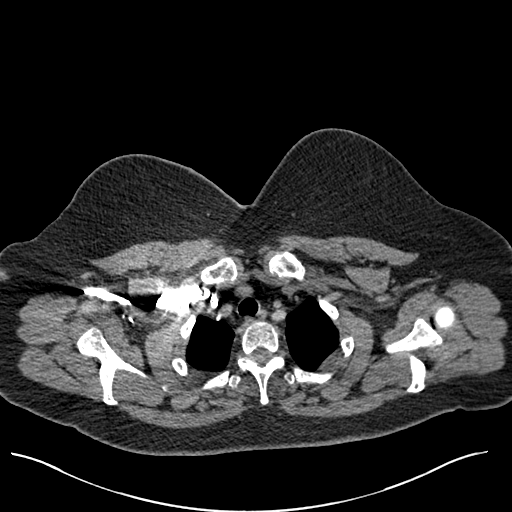
[im 259/275  lung]
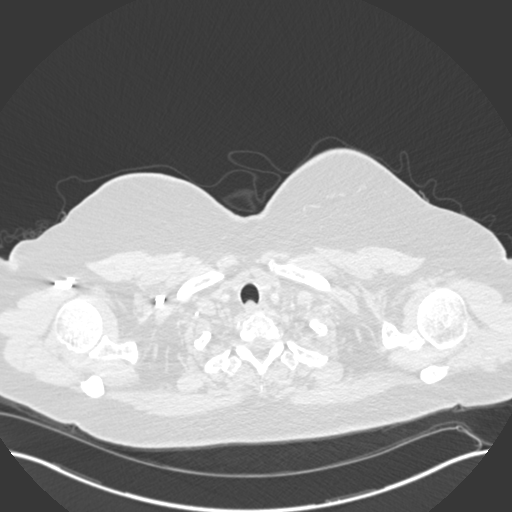

[Series 7: coronal mpr · coronal · 0.58mm/px · 2 of 90 slices shown]
[im 30/90  soft-tissue]
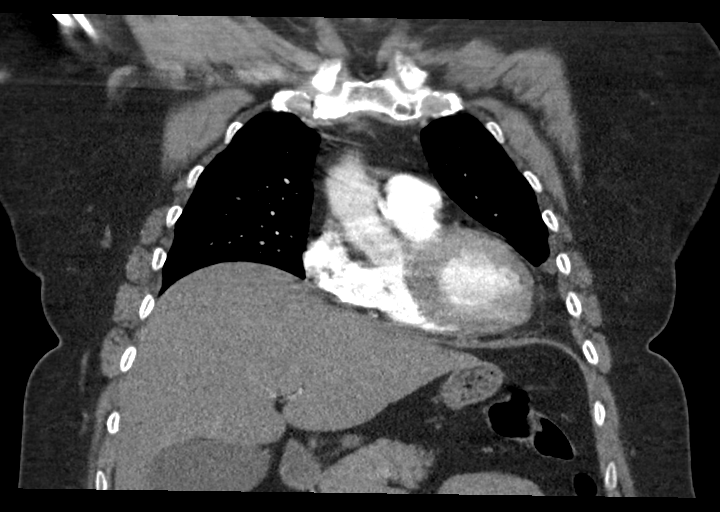
[im 60/90  soft-tissue]
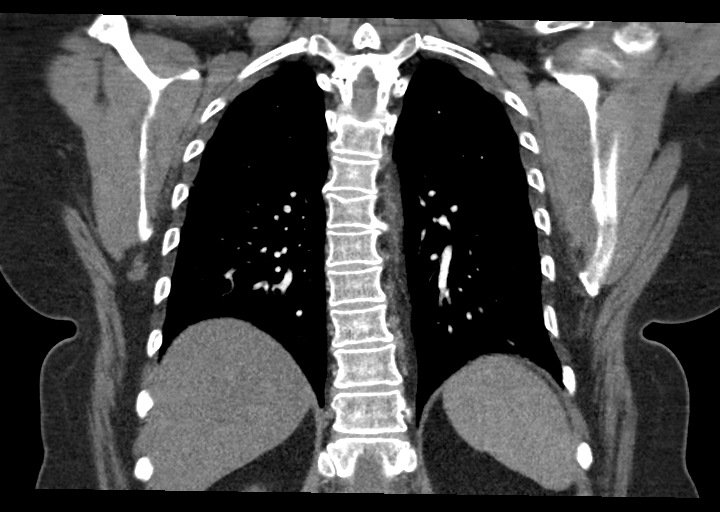

[17 of 46 positions shown; findings below may reference images not displayed]

RADIATION DOSE REDUCTION: This exam was performed according to the
departmental dose-optimization program which includes automated
exposure control, adjustment of the mA and/or kV according to
patient size and/or use of iterative reconstruction technique.

CONTRAST:  75mL OMNIPAQUE IOHEXOL 350 MG/ML SOLN
FINDINGS: Cardiovascular: Satisfactory opacification of the pulmonary arteries
to the segmental level. Persistent but improved central left
pulmonary artery filling defect. Extension into the left lower lobe
segmental pulmonary arteries. Improved right pulmonary emboli with
persistent right lower lobe segmental pulmonary embolus. No
associated right heart strain or pulmonary infarction. Normal heart
size. No significant pericardial effusion. The thoracic aorta is
normal in caliber. No atherosclerotic plaque of the thoracic aorta.
No coronary artery calcifications.

Mediastinum/Nodes: No enlarged mediastinal, hilar, or axillary lymph
nodes. Thyroid gland, trachea, and esophagus demonstrate no
significant findings.

Lungs/Pleura: Lingular atelectasis. No focal consolidation. No
pulmonary nodule. No pulmonary mass. No pleural effusion. No
pneumothorax.

Upper Abdomen: No acute abnormality.

Musculoskeletal:

No chest wall abnormality.

No suspicious lytic or blastic osseous lesions. No acute displaced
fracture. Multilevel mild degenerative changes of the spine.

Review of the MIP images confirms the above findings.
IMPRESSION: 1. Persistent but improved nonocclusive central left pulmonary
embolus with extension to the left lower lobe segmental level.
Nonocclusive right lower lobe segmental pulmonary embolus. No heart
strain or pulmonary infarction.
2. No acute intrapulmonary abnormality.

These results were called by telephone at the time of interpretation
on [DATE] at [DATE] to provider NIVIRUS , who verbally
acknowledged these results.

## 2021-12-05 MED ORDER — ONDANSETRON 4 MG PO TBDP
4.0000 mg | ORAL_TABLET | Freq: Once | ORAL | Status: AC
  Administered 2021-12-05: 4 mg via ORAL
  Filled 2021-12-05: qty 1

## 2021-12-05 MED ORDER — ONDANSETRON HCL 4 MG PO TABS: 4.0000 mg | ORAL_TABLET | Freq: Three times a day (TID) | ORAL | 0 refills | Status: DC | PRN

## 2021-12-05 MED ORDER — IOHEXOL 350 MG/ML SOLN
75.0000 mL | Freq: Once | INTRAVENOUS | Status: AC | PRN
  Administered 2021-12-05: 75 mL via INTRAVENOUS

## 2021-12-05 MED ORDER — POTASSIUM CHLORIDE CRYS ER 20 MEQ PO TBCR
40.0000 meq | EXTENDED_RELEASE_TABLET | Freq: Once | ORAL | Status: AC
  Administered 2021-12-05: 40 meq via ORAL
  Filled 2021-12-05: qty 2

## 2021-12-05 NOTE — ED Triage Notes (Signed)
Patient to ER via [facility] with complaints of left sided rib/chest pain  that started last night. Reports pain radiates into shoulder blade. Reports she suddenly broke out in a sweat, had diarrhea, and vomiting when symptoms started last night. Endorses some shortness of breath.   Reports she is currently taking eliquis for left sided pulmonary embolisms. Has been taking meds as prescribed.

## 2021-12-05 NOTE — ED Provider Notes (Signed)
[facility] Provider Note    Event Date/Time   First MD Initiated Contact with Patient 12/05/21 2016     (approximate)   History   Chest Pain   HPI  Krista Blake is a 56 y.o. female with past medical history of recently diagnosed pulmonary embolism on Eliquis, hypertension with chest pain, vomiting and diarrhea.  Patient was admitted on 11/13/2021 after CTA showed PE in the left and right pulmonary arteries.  She was started on Eliquis and discharged.  She had been relatively pain-free until last night at a [employer] party she developed some pleuritic pain in the left upper chest rating to her back.  She also developed nausea vomiting and diarrhea.  She had some ongoing pain today as well as multiple episodes of diarrhea and vomiting.  She called her primary doctor who told her to come to the emergency department.  She has been compliant with Eliquis.  Has had a mild cough but no fevers or chills.  No significant shortness of breath.    Past Medical History:  Diagnosis Date   Bulging lumbar disc    History of mammogram 03/04/2015   BIRAD 1   History of Papanicolaou smear of cervix 02/09/2016   NIL/NEG   Hypertension    Pap smear abnormality of cervix/human papillomavirus (HPV) positive 02/05/2015   NIL Pap with positive HPV aptima.  HPV 16,18.45 neg   Screening for colon cancer 07/2018   neg Cologuard; repeat in 3 yrs    Patient Active Problem List   Diagnosis Date Noted   Pulmonary embolism ([place]) 11/13/2021   Obesity (BMI 30-39.9) 11/13/2021   Hypokalemia 11/13/2021   Septic arthritis of knee, left ([place]) 06/07/2021   Pre-diabetes 11/09/2020   Elevated blood pressure reading without diagnosis of hypertension 08/05/2019   Essential hypertension 11/04/2014     Physical Exam  Triage Vital Signs: ED Triage Vitals [12/05/21 1747]  Enc Vitals Group     BP (!) 144/94     Pulse Rate (!) 123     Resp 20     Temp 98.5 F (36.9 C)     Temp  Source Oral     SpO2 97 %     Weight 220 lb (99.8 kg)     Height 5\' 4"  (1.626 m)     Head Circumference      Peak Flow      Pain Score      Pain Loc      Pain Edu?      Excl. in [place]?     Most recent vital signs: Vitals:   12/05/21 1747 12/05/21 2050  BP: (!) 144/94   Pulse: (!) 123 95  Resp: 20   Temp: 98.5 F (36.9 C)   SpO2: 97%      General: Awake, no distress.  CV:  Good peripheral perfusion.  Resp:  Normal effort.  Lungs are clear Abd:  No distention.  Soft and nontender throughout Neuro:             Awake, Alert, Oriented x 3  Other:     ED Results / Procedures / Treatments  Labs (all labs ordered are listed, but only abnormal results are displayed) Labs Reviewed  COMPREHENSIVE METABOLIC PANEL - Abnormal; Notable for the following components:      Result Value   Potassium 2.8 (*)    Glucose, Bld 162 (*)    All other components within normal limits  URINALYSIS, COMPLETE (UACMP)  WITH MICROSCOPIC - Abnormal; Notable for the following components:   Color, Urine YELLOW (*)    APPearance CLOUDY (*)    Hgb urine dipstick SMALL (*)    Protein, ur 30 (*)    Bacteria, UA RARE (*)    All other components within normal limits  RESP PANEL BY RT-PCR (FLU A&B, COVID) ARPGX2  CBC WITH DIFFERENTIAL/PLATELET  TROPONIN I (HIGH SENSITIVITY)     EKG  EKG interpretation performed by myself: NSR, nml axis, nml intervals, no acute ischemic changes    RADIOLOGY I reviewed the CTA which shows persistent but improvement in her prior pulmonary embolism in the left and right pulmonary arteries   PROCEDURES:   MEDICATIONS ORDERED IN ED: Medications  iohexol (OMNIPAQUE) 350 MG/ML injection 75 mL (75 mLs Intravenous Contrast Given 12/05/21 1906)  ondansetron (ZOFRAN-ODT) disintegrating tablet 4 mg (4 mg Oral Given 12/05/21 2037)  potassium chloride SA (KLOR-CON M) CR tablet 40 mEq (40 mEq Oral Given 12/05/21 2037)     IMPRESSION / MDM / ASSESSMENT AND PLAN / ED COURSE  I  reviewed the triage vital signs and the nursing notes.                              Differential diagnosis includes, but is not limited to, new pulmonary embolism, pleurisy, pneumonia, ACS, viral syndrome  Patient is a 56 year old female with a history of recently diagnosed large central PE on Eliquis presents with pleuritic pain as well as vomiting and diarrhea.  She had been doing well after discharge from the hospital relatively pain-free until last night developed some pleuritic chest pain as well as vomiting and diarrhea.  She denies significant shortness of breath.  Does still continue to have some chest pain.  Vomiting has now stopped but continues to have diarrhea.  No significant abdominal pain.  Her initial vital signs are notable for tachycardia to 123 however this resolved without intervention.  CTA of the chest was ordered from triage which shows persistent but improved pulmonary embolism in the left and right pulmonary arteries.  There is no new PE so this does not represent a failure of anticoagulation.  Her troponin is negative and her labs are otherwise reassuring except for a potassium of 2.8.  CBC without leukocytosis.  Viewed her EKG which is nonischemic.  I suspect her hypokalemia was in the setting of multiple episodes of emesis.  Patient given p.o. supplement and Zofran.  Tolerating p.o.  Unclear ultimately why she is having chest pain, he related to viral illness given her other symptoms.  She is hemodynamically stable and on the appropriate treatment for her PE.  I think she is appropriate for discharge.  Patient would like to be referred to pulmonology.  Clinical Course as of 12/05/21 2056  Mon Dec 05, 2021  2029 EKG 12-Lead [KM]    Clinical Course User Index [KM] Rada Hay, MD     FINAL CLINICAL IMPRESSION(S) / ED DIAGNOSES   Final diagnoses:  Hypokalemia  Vomiting and diarrhea     Rx / DC Orders   ED Discharge Orders          Ordered    ondansetron  (ZOFRAN) 4 MG tablet  Every 8 hours PRN        12/05/21 2043             Note:  This document was prepared using Dragon voice recognition software and may include  unintentional dictation errors.   Rada Hay, MD 12/05/21 2056

## 2021-12-05 NOTE — ED Provider Triage Note (Signed)
Emergency Medicine Provider Triage Evaluation Note  Krista Blake , a 56 y.o. female  was evaluated in triage.  Pt complains of left-sided rib and chest pain.  She presents to the ED with onset of symptoms last night.  She reports radiation of the pain into the shoulder blade.  She is currently broke out in a sweat and had episodes of diarrhea and vomiting or symptoms began last night.  She notes some associated shortness of breath.  Patient with history of DVT, PE diagnosed in January, currently on daily Eliquis.  She denies any associated fever, recent cough or cold symptoms, or sick contacts.  Review of Systems  Positive: CP, SOB, diarrhea Negative: FCS  Physical Exam  BP (!) 144/94 (BP Location: Left Arm)    Pulse (!) 123    Temp 98.5 F (36.9 C) (Oral)    Resp 20    Ht 5\' 4"  (1.626 m)    Wt 99.8 kg    LMP  (LMP Unknown)    SpO2 97%    BMI 37.76 kg/m  Gen:   Awake, no distress   Resp:  Normal effort  MSK:   Moves extremities without difficulty  Other:  CVS: RRR  Medical Decision Making  Medically screening exam initiated at 6:07 PM.  Appropriate orders placed.  Krista Blake was informed that the remainder of the evaluation will be completed by another provider, this initial triage assessment does not replace that evaluation, and the importance of remaining in the ED until their evaluation is complete.  Patient with a history of DVT, PE, presents to the ED for evaluation of sudden onset of left-sided chest pain with associated shortness of breath, with symptoms since last night.   Krista Needles, PA-C 12/05/21 1825

## 2021-12-05 NOTE — Discharge Instructions (Addendum)
Your CT today did not show any new blood clot.  Your blood work was notable for hypokalemia which is likely in the setting of vomiting.  You can take the Zofran as needed for vomiting.  Please return to the emergency department if you are unable to eat or drink.  Please follow-up with your primary care provider.  I have given you the name of a pulmonologist that you can call to schedule an appointment with as well.  Please continue to take your Eliquis.

## 2021-12-13 ENCOUNTER — Ambulatory Visit (INDEPENDENT_AMBULATORY_CARE_PROVIDER_SITE_OTHER): Payer: BC Managed Care – PPO | Admitting: Nurse Practitioner

## 2021-12-13 ENCOUNTER — Encounter (INDEPENDENT_AMBULATORY_CARE_PROVIDER_SITE_OTHER): Payer: Self-pay | Admitting: Nurse Practitioner

## 2021-12-13 ENCOUNTER — Other Ambulatory Visit: Payer: Self-pay

## 2021-12-13 VITALS — BP 134/83 | HR 74 | Ht 67.0 in | Wt 227.0 lb

## 2021-12-13 DIAGNOSIS — I2699 Other pulmonary embolism without acute cor pulmonale: Secondary | ICD-10-CM

## 2021-12-13 DIAGNOSIS — I82412 Acute embolism and thrombosis of left femoral vein: Secondary | ICD-10-CM

## 2021-12-13 DIAGNOSIS — I10 Essential (primary) hypertension: Secondary | ICD-10-CM | POA: Diagnosis not present

## 2021-12-13 NOTE — H&P (View-Only) (Signed)
Subjective:    Patient ID: Krista Blake, female    DOB: 1966/04/14, 56 y.o.   MRN: [id number] Chief Complaint  Patient presents with   New Patient (Initial Visit)    NP consult sparks LLE popliteal DVT in 10/2021      Krista Blake is a 56 year old female that presents today as a referral from Dr. Doy Hutching in regards to left lower extremity DVT.  The patient recently had a surgery for her meniscus however it became infected and she has had to have multiple surgeries from the initial surgery.  She has also had to undergo multiple rounds of IV antibiotics and is currently on oral antibiotics that repeat regularly every 6 weeks.  On 11/13/2021 the patient noticed swelling in her left lower extremity as well as some shortness of breath.  She went to the emergency room for evaluation and it was found that the patient had a distal femoral DVT as well as a large burden bilateral pulmonary embolism.  The patient was initiated on heparin drip and started on Eliquis and ultimately discharged to the hospital 3 days later.  The patient was slowly starting to improve until 12/05/2021 where she was at a [employer] party and began to notice pain with taking a deep breath.  This pain became more consistent and she subsequently began to have severe nausea, vomiting and diarrhea.  Due to these reactions and symptoms which contact her PCP she was urged to go immediately to the emergency room.  Reevaluation in the emergency room showed continued pulmonary embolism although there has been very mild improvement.   Review of Systems  Constitutional:  Positive for appetite change and fatigue.  Respiratory:  Positive for shortness of breath.   Cardiovascular:  Positive for chest pain and leg swelling.  All other systems reviewed and are negative.     Objective:   Physical Exam Vitals reviewed.  HENT:     Head: Normocephalic.  Cardiovascular:     Rate and Rhythm: Normal rate.  Pulmonary:     Effort:  Pulmonary effort is normal.  Musculoskeletal:     Left lower leg: 1+ Edema present.  Skin:    General: Skin is warm and dry.  Neurological:     Mental Status: She is alert and oriented to person, place, and time.  Psychiatric:        Mood and Affect: Mood normal.        Behavior: Behavior normal.        Thought Content: Thought content normal.        Judgment: Judgment normal.    BP 134/83    Pulse 74    Ht 5\' 7"  (1.702 m)    Wt 227 lb (103 kg)    LMP  (LMP Unknown)    BMI 35.55 kg/m   Past Medical History:  Diagnosis Date   Bulging lumbar disc    History of mammogram 03/04/2015   BIRAD 1   History of Papanicolaou smear of cervix 02/09/2016   NIL/NEG   Hypertension    Pap smear abnormality of cervix/human papillomavirus (HPV) positive 02/05/2015   NIL Pap with positive HPV aptima.  HPV 16,18.45 neg   Screening for colon cancer 07/2018   neg Cologuard; repeat in 3 yrs    Social History   Socioeconomic History   Marital status: Single    Spouse name: Not on file   Number of children: Not on file   Years of education: Not on file  Highest education level: Not on file  Occupational History   Not on file  Tobacco Use   Smoking status: Never   Smokeless tobacco: Never  Vaping Use   Vaping Use: Never used  Substance and Sexual Activity   Alcohol use: Yes    Alcohol/week: 0.0 standard drinks    Comment: social   Drug use: No   Sexual activity: Not Currently    Birth control/protection: Pill  Other Topics Concern   Not on file  Social History Narrative   Not on file   Social Determinants of Health   Financial Resource Strain: Not on file  Food Insecurity: Not on file  Transportation Needs: Not on file  Physical Activity: Not on file  Stress: Not on file  Social Connections: Not on file  Intimate Partner Violence: Not on file    Past Surgical History:  Procedure Laterality Date   I & D EXTREMITY Left 05/04/2021   Procedure: KNEE ARTHROSCOPY WITH  IRRIGATION AND DEBRIDEMENT;  Surgeon: Corky Mull, MD;  Location: [facility];  Service: Orthopedics;  Laterality: Left;   KNEE ARTHROSCOPY Left 06/07/2021   Procedure: ARTHROSCOPIC IRRIGATION AND DEBRIDEMENT;  Surgeon: Corky Mull, MD;  Location: [facility];  Service: Orthopedics;  Laterality: Left;   KNEE ARTHROSCOPY Left 08/03/2021   Procedure: LEFT KNEE REPEAT IRRIGATION AND DEBRIDEMENT WITH BONE CULTURES. [place];  Surgeon: Corky Mull, MD;  Location: [facility];  Service: Orthopedics;  Laterality: Left;   KNEE ARTHROSCOPY Left 08/08/2021   Procedure: ARTHROSCOPY KNEE;  Surgeon: Corky Mull, MD;  Location: [facility];  Service: Orthopedics;  Laterality: Left;    Family History  Problem Relation Age of Onset   Hypertension Father    Diabetes Paternal Uncle    Diabetes Paternal Grandfather    Ovarian cancer Neg Hx    Breast cancer Neg Hx    Colon cancer Neg Hx     Allergies  Allergen Reactions   Penicillins Hives    Reaction : Hives during college TOLERATED CEFAZOLIN AND AMOXICILLIN    CBC Latest Ref Rng & Units 12/05/2021 11/15/2021 11/14/2021  WBC 4.0 - 10.5 K/uL 8.5 9.0 9.1  Hemoglobin 12.0 - 15.0 g/dL 14.4 11.4(L) 11.8(L)  Hematocrit 36.0 - 46.0 % 43.2 32.8(L) 34.3(L)  Platelets 150 - 400 K/uL 218 160 144(L)      CMP     Component Value Date/Time   NA 136 12/05/2021 1749   NA 140 08/07/2019 0819   K 2.8 (L) 12/05/2021 1749   CL 100 12/05/2021 1749   CO2 25 12/05/2021 1749   GLUCOSE 162 (H) 12/05/2021 1749   BUN 19 12/05/2021 1749   BUN 12 08/07/2019 0819   CREATININE 0.75 12/05/2021 1749   CALCIUM 9.5 12/05/2021 1749   PROT 7.8 12/05/2021 1749   PROT 6.9 08/07/2019 0819   ALBUMIN 4.4 12/05/2021 1749   ALBUMIN 4.4 08/07/2019 0819   AST 24 12/05/2021 1749   ALT 19 12/05/2021 1749   ALKPHOS 93 12/05/2021 1749   BILITOT 0.8 12/05/2021 1749   BILITOT 0.4 08/07/2019 0819   GFRNONAA >60 12/05/2021 1749   GFRAA 102 08/07/2019 0819     No results found.      Assessment & Plan:   1. Acute pulmonary embolism without acute cor pulmonale, unspecified pulmonary embolism type ([place]) I discussed pulmonary thrombectomy with patient as well as the general standard timing of this.  At this point its been approximately a month from her initial diagnosis but she still  continues to have chest pain and significant fatigue and shortness of breath with little exertion.  Pulmonary thrombectomy is best done right after initial diagnosis up to 4 weeks, unfortunately we were not consulted until this referral.  We are currently nearing the 4-week mark since her diagnosis and it was explained that with thrombectomy we cannot remove 100% of the clot but we can certainly try to remove enough to help decrease her symptoms.  Is also helping to heal along the right as well.  I discussed the risks, benefits and alternatives and the patient agrees to proceed.  Patient will continue with Eliquis discussed that she will likely need to be on this for at least a minimum of 1 year due to the provoked DVT.  2. Essential hypertension Continue antihypertensive medications as already ordered, these medications have been reviewed and there are no changes at this time.     3. Acute deep vein thrombosis (DVT) of femoral vein of left lower extremity (HCC) The patient is still in the process of healing her left knee and there have been multiple infections so holding off on compression may be in the best interest at this time.  The patient is advised to continue to elevate her left lower extremity for swelling.  She is advised to walk as tolerated.  We could also try some crew level compression to help with the swelling around the ankle area.  We will plan on reevaluating in approximately 4 to 6 weeks for resolution.   Current Outpatient Medications on File Prior to Visit  Medication Sig Dispense Refill   acetaminophen (TYLENOL) 650 MG CR tablet Take 1,300 mg by mouth every 8 (eight) hours as  needed for pain.     ADVAIR DISKUS 100-50 MCG/ACT AEPB SMARTSIG:1 Inhalation Via Inhaler Every 12 Hours     allopurinol (ZYLOPRIM) 100 MG tablet Take 100 mg by mouth daily.     [START ON 12/15/2021] apixaban (ELIQUIS) 5 MG TABS tablet Take 1 tablet (5 mg total) by mouth 2 (two) times daily. Start after you finish starter pack 60 tablet 2   APIXABAN (ELIQUIS) VTE STARTER PACK (10MG  AND 5MG ) Take as directed on package: start with two-5mg  tablets twice daily for 7 days. On day 8, switch to one-5mg  tablet twice daily. 1 each 0   benzonatate (TESSALON) 100 MG capsule Take 1 capsule (100 mg total) by mouth 2 (two) times daily. 20 capsule 0   calcium gluconate 500 MG tablet Take 1 tablet by mouth daily.     cephALEXin (KEFLEX) 500 MG capsule Take 500 mg by mouth 4 (four) times daily.     Cholecalciferol (VITAMIN D3) 1.25 MG (50000 UT) CAPS Take 1 capsule by mouth daily.     hydrochlorothiazide (HYDRODIURIL) 25 MG tablet Take 25 mg by mouth daily.     HYDROcodone-acetaminophen (NORCO/VICODIN) 5-325 MG tablet Take 1-2 tablets by mouth every 4 (four) hours as needed for moderate pain or severe pain. 60 tablet 0   Magnesium 250 MG TABS Take 250 mg by mouth daily.     methocarbamol (ROBAXIN) 750 MG tablet Take 1 tablet (750 mg total) by mouth every 8 (eight) hours as needed for muscle spasms. 30 tablet 0   ondansetron (ZOFRAN) 4 MG tablet Take 1 tablet (4 mg total) by mouth every 8 (eight) hours as needed for nausea or vomiting. 20 tablet 0   propranolol (INDERAL) 40 MG tablet Take 20 mg by mouth daily.     PARoxetine (PAXIL) 20 MG  tablet Take 20 mg by mouth daily.     rosuvastatin (CRESTOR) 10 MG tablet Take 10 mg by mouth daily.     No current facility-administered medications on file prior to visit.    There are no Patient Instructions on file for this visit. No follow-ups on file.   Kris Hartmann, NP

## 2021-12-13 NOTE — Progress Notes (Signed)
Subjective:    Patient ID: Krista Blake, female    DOB: 1966-10-03, 56 y.o.   MRN: [id number] Chief Complaint  Patient presents with   New Patient (Initial Visit)    NP consult sparks LLE popliteal DVT in 10/2021      Krista Blake is a 56 year old female that presents today as a referral from Dr. Doy Hutching in regards to left lower extremity DVT.  The patient recently had a surgery for her meniscus however it became infected and she has had to have multiple surgeries from the initial surgery.  She has also had to undergo multiple rounds of IV antibiotics and is currently on oral antibiotics that repeat regularly every 6 weeks.  On 11/13/2021 the patient noticed swelling in her left lower extremity as well as some shortness of breath.  She went to the emergency room for evaluation and it was found that the patient had a distal femoral DVT as well as a large burden bilateral pulmonary embolism.  The patient was initiated on heparin drip and started on Eliquis and ultimately discharged to the hospital 3 days later.  The patient was slowly starting to improve until 12/05/2021 where she was at a [employer] party and began to notice pain with taking a deep breath.  This pain became more consistent and she subsequently began to have severe nausea, vomiting and diarrhea.  Due to these reactions and symptoms which contact her PCP she was urged to go immediately to the emergency room.  Reevaluation in the emergency room showed continued pulmonary embolism although there has been very mild improvement.   Review of Systems  Constitutional:  Positive for appetite change and fatigue.  Respiratory:  Positive for shortness of breath.   Cardiovascular:  Positive for chest pain and leg swelling.  All other systems reviewed and are negative.     Objective:   Physical Exam Vitals reviewed.  HENT:     Head: Normocephalic.  Cardiovascular:     Rate and Rhythm: Normal rate.  Pulmonary:     Effort:  Pulmonary effort is normal.  Musculoskeletal:     Left lower leg: 1+ Edema present.  Skin:    General: Skin is warm and dry.  Neurological:     Mental Status: She is alert and oriented to person, place, and time.  Psychiatric:        Mood and Affect: Mood normal.        Behavior: Behavior normal.        Thought Content: Thought content normal.        Judgment: Judgment normal.    BP 134/83    Pulse 74    Ht 5\' 7"  (1.702 m)    Wt 227 lb (103 kg)    LMP  (LMP Unknown)    BMI 35.55 kg/m   Past Medical History:  Diagnosis Date   Bulging lumbar disc    History of mammogram 03/04/2015   BIRAD 1   History of Papanicolaou smear of cervix 02/09/2016   NIL/NEG   Hypertension    Pap smear abnormality of cervix/human papillomavirus (HPV) positive 02/05/2015   NIL Pap with positive HPV aptima.  HPV 16,18.45 neg   Screening for colon cancer 07/2018   neg Cologuard; repeat in 3 yrs    Social History   Socioeconomic History   Marital status: Single    Spouse name: Not on file   Number of children: Not on file   Years of education: Not on file  Highest education level: Not on file  Occupational History   Not on file  Tobacco Use   Smoking status: Never   Smokeless tobacco: Never  Vaping Use   Vaping Use: Never used  Substance and Sexual Activity   Alcohol use: Yes    Alcohol/week: 0.0 standard drinks    Comment: social   Drug use: No   Sexual activity: Not Currently    Birth control/protection: Pill  Other Topics Concern   Not on file  Social History Narrative   Not on file   Social Determinants of Health   Financial Resource Strain: Not on file  Food Insecurity: Not on file  Transportation Needs: Not on file  Physical Activity: Not on file  Stress: Not on file  Social Connections: Not on file  Intimate Partner Violence: Not on file    Past Surgical History:  Procedure Laterality Date   I & D EXTREMITY Left 05/04/2021   Procedure: KNEE ARTHROSCOPY WITH  IRRIGATION AND DEBRIDEMENT;  Surgeon: Corky Mull, MD;  Location: [facility];  Service: Orthopedics;  Laterality: Left;   KNEE ARTHROSCOPY Left 06/07/2021   Procedure: ARTHROSCOPIC IRRIGATION AND DEBRIDEMENT;  Surgeon: Corky Mull, MD;  Location: [facility];  Service: Orthopedics;  Laterality: Left;   KNEE ARTHROSCOPY Left 08/03/2021   Procedure: LEFT KNEE REPEAT IRRIGATION AND DEBRIDEMENT WITH BONE CULTURES. [place];  Surgeon: Corky Mull, MD;  Location: [facility];  Service: Orthopedics;  Laterality: Left;   KNEE ARTHROSCOPY Left 08/08/2021   Procedure: ARTHROSCOPY KNEE;  Surgeon: Corky Mull, MD;  Location: [facility];  Service: Orthopedics;  Laterality: Left;    Family History  Problem Relation Age of Onset   Hypertension Father    Diabetes Paternal Uncle    Diabetes Paternal Grandfather    Ovarian cancer Neg Hx    Breast cancer Neg Hx    Colon cancer Neg Hx     Allergies  Allergen Reactions   Penicillins Hives    Reaction : Hives during college TOLERATED CEFAZOLIN AND AMOXICILLIN    CBC Latest Ref Rng & Units 12/05/2021 11/15/2021 11/14/2021  WBC 4.0 - 10.5 K/uL 8.5 9.0 9.1  Hemoglobin 12.0 - 15.0 g/dL 14.4 11.4(L) 11.8(L)  Hematocrit 36.0 - 46.0 % 43.2 32.8(L) 34.3(L)  Platelets 150 - 400 K/uL 218 160 144(L)      CMP     Component Value Date/Time   NA 136 12/05/2021 1749   NA 140 08/07/2019 0819   K 2.8 (L) 12/05/2021 1749   CL 100 12/05/2021 1749   CO2 25 12/05/2021 1749   GLUCOSE 162 (H) 12/05/2021 1749   BUN 19 12/05/2021 1749   BUN 12 08/07/2019 0819   CREATININE 0.75 12/05/2021 1749   CALCIUM 9.5 12/05/2021 1749   PROT 7.8 12/05/2021 1749   PROT 6.9 08/07/2019 0819   ALBUMIN 4.4 12/05/2021 1749   ALBUMIN 4.4 08/07/2019 0819   AST 24 12/05/2021 1749   ALT 19 12/05/2021 1749   ALKPHOS 93 12/05/2021 1749   BILITOT 0.8 12/05/2021 1749   BILITOT 0.4 08/07/2019 0819   GFRNONAA >60 12/05/2021 1749   GFRAA 102 08/07/2019 0819     No results found.      Assessment & Plan:   1. Acute pulmonary embolism without acute cor pulmonale, unspecified pulmonary embolism type ([place]) I discussed pulmonary thrombectomy with patient as well as the general standard timing of this.  At this point its been approximately a month from her initial diagnosis but she still  continues to have chest pain and significant fatigue and shortness of breath with little exertion.  Pulmonary thrombectomy is best done right after initial diagnosis up to 4 weeks, unfortunately we were not consulted until this referral.  We are currently nearing the 4-week mark since her diagnosis and it was explained that with thrombectomy we cannot remove 100% of the clot but we can certainly try to remove enough to help decrease her symptoms.  Is also helping to heal along the right as well.  I discussed the risks, benefits and alternatives and the patient agrees to proceed.  Patient will continue with Eliquis discussed that she will likely need to be on this for at least a minimum of 1 year due to the provoked DVT.  2. Essential hypertension Continue antihypertensive medications as already ordered, these medications have been reviewed and there are no changes at this time.     3. Acute deep vein thrombosis (DVT) of femoral vein of left lower extremity (HCC) The patient is still in the process of healing her left knee and there have been multiple infections so holding off on compression may be in the best interest at this time.  The patient is advised to continue to elevate her left lower extremity for swelling.  She is advised to walk as tolerated.  We could also try some crew level compression to help with the swelling around the ankle area.  We will plan on reevaluating in approximately 4 to 6 weeks for resolution.   Current Outpatient Medications on File Prior to Visit  Medication Sig Dispense Refill   acetaminophen (TYLENOL) 650 MG CR tablet Take 1,300 mg by mouth every 8 (eight) hours as  needed for pain.     ADVAIR DISKUS 100-50 MCG/ACT AEPB SMARTSIG:1 Inhalation Via Inhaler Every 12 Hours     allopurinol (ZYLOPRIM) 100 MG tablet Take 100 mg by mouth daily.     [START ON 12/15/2021] apixaban (ELIQUIS) 5 MG TABS tablet Take 1 tablet (5 mg total) by mouth 2 (two) times daily. Start after you finish starter pack 60 tablet 2   APIXABAN (ELIQUIS) VTE STARTER PACK (10MG  AND 5MG ) Take as directed on package: start with two-5mg  tablets twice daily for 7 days. On day 8, switch to one-5mg  tablet twice daily. 1 each 0   benzonatate (TESSALON) 100 MG capsule Take 1 capsule (100 mg total) by mouth 2 (two) times daily. 20 capsule 0   calcium gluconate 500 MG tablet Take 1 tablet by mouth daily.     cephALEXin (KEFLEX) 500 MG capsule Take 500 mg by mouth 4 (four) times daily.     Cholecalciferol (VITAMIN D3) 1.25 MG (50000 UT) CAPS Take 1 capsule by mouth daily.     hydrochlorothiazide (HYDRODIURIL) 25 MG tablet Take 25 mg by mouth daily.     HYDROcodone-acetaminophen (NORCO/VICODIN) 5-325 MG tablet Take 1-2 tablets by mouth every 4 (four) hours as needed for moderate pain or severe pain. 60 tablet 0   Magnesium 250 MG TABS Take 250 mg by mouth daily.     methocarbamol (ROBAXIN) 750 MG tablet Take 1 tablet (750 mg total) by mouth every 8 (eight) hours as needed for muscle spasms. 30 tablet 0   ondansetron (ZOFRAN) 4 MG tablet Take 1 tablet (4 mg total) by mouth every 8 (eight) hours as needed for nausea or vomiting. 20 tablet 0   propranolol (INDERAL) 40 MG tablet Take 20 mg by mouth daily.     PARoxetine (PAXIL) 20 MG  tablet Take 20 mg by mouth daily.     rosuvastatin (CRESTOR) 10 MG tablet Take 10 mg by mouth daily.     No current facility-administered medications on file prior to visit.    There are no Patient Instructions on file for this visit. No follow-ups on file.   Kris Hartmann, NP

## 2021-12-19 ENCOUNTER — Ambulatory Visit
Admission: RE | Admit: 2021-12-19 | Discharge: 2021-12-19 | Disposition: A | Discharge status: Home / Self Care | Payer: BC Managed Care – PPO | Attending: Vascular Surgery | Admitting: Vascular Surgery

## 2021-12-19 ENCOUNTER — Other Ambulatory Visit (INDEPENDENT_AMBULATORY_CARE_PROVIDER_SITE_OTHER): Payer: Self-pay | Admitting: Nurse Practitioner

## 2021-12-19 ENCOUNTER — Encounter
Admission: RE | Disposition: A | Discharge status: Home / Self Care | Payer: Self-pay | Source: Home / Self Care | Attending: Vascular Surgery

## 2021-12-19 ENCOUNTER — Encounter (INDEPENDENT_AMBULATORY_CARE_PROVIDER_SITE_OTHER): Payer: Self-pay | Admitting: Vascular Surgery

## 2021-12-19 ENCOUNTER — Other Ambulatory Visit: Payer: Self-pay

## 2021-12-19 ENCOUNTER — Encounter: Payer: Self-pay | Admitting: Vascular Surgery

## 2021-12-19 DIAGNOSIS — I10 Essential (primary) hypertension: Secondary | ICD-10-CM | POA: Insufficient documentation

## 2021-12-19 DIAGNOSIS — I82412 Acute embolism and thrombosis of left femoral vein: Secondary | ICD-10-CM | POA: Insufficient documentation

## 2021-12-19 DIAGNOSIS — I2699 Other pulmonary embolism without acute cor pulmonale: Secondary | ICD-10-CM | POA: Diagnosis not present

## 2021-12-19 DIAGNOSIS — Z7901 Long term (current) use of anticoagulants: Secondary | ICD-10-CM | POA: Insufficient documentation

## 2021-12-19 HISTORY — PX: PULMONARY THROMBECTOMY: CATH118295

## 2021-12-19 LAB — CREATININE, SERUM
Creatinine, Ser: 0.71 mg/dL (ref 0.44–1.00)
GFR, Estimated: >60 mL/min (ref >60)

## 2021-12-19 LAB — BUN: BUN: 16 mg/dL (ref 6–20)

## 2021-12-19 SURGERY — PULMONARY THROMBECTOMY
Anesthesia: Moderate Sedation

## 2021-12-19 MED ORDER — CEFAZOLIN SODIUM-DEXTROSE 2-4 GM/100ML-% IV SOLN
INTRAVENOUS | Status: AC
  Filled 2021-12-19: qty 100

## 2021-12-19 MED ORDER — DIPHENHYDRAMINE HCL 50 MG/ML IJ SOLN: 50.0000 mg | Freq: Once | INTRAMUSCULAR | Status: DC | PRN

## 2021-12-19 MED ORDER — SODIUM CHLORIDE 0.9 % IV SOLN: INTRAVENOUS | Status: DC

## 2021-12-19 MED ORDER — ONDANSETRON HCL 4 MG/2ML IJ SOLN: 4.0000 mg | Freq: Four times a day (QID) | INTRAMUSCULAR | Status: DC | PRN

## 2021-12-19 MED ORDER — HYDROMORPHONE HCL 1 MG/ML IJ SOLN: 1.0000 mg | Freq: Once | INTRAMUSCULAR | Status: DC | PRN

## 2021-12-19 MED ORDER — MIDAZOLAM HCL 2 MG/ML PO SYRP
8.0000 mg | ORAL_SOLUTION | Freq: Once | ORAL | Status: AC | PRN
Start: 2021-12-19 — End: 2021-12-19
  Administered 2021-12-19: 8 mg via ORAL

## 2021-12-19 MED ORDER — CEFAZOLIN SODIUM-DEXTROSE 2-4 GM/100ML-% IV SOLN: 2.0000 g | Freq: Once | INTRAVENOUS | Status: DC

## 2021-12-19 MED ORDER — CLINDAMYCIN PHOSPHATE 300 MG/50ML IV SOLN
INTRAVENOUS | Status: AC
  Filled 2021-12-19: qty 50

## 2021-12-19 MED ORDER — MIDAZOLAM HCL 2 MG/2ML IJ SOLN
INTRAMUSCULAR | Status: DC | PRN
Start: 2021-12-19 — End: 2021-12-19
  Administered 2021-12-19: 2 mg via INTRAVENOUS

## 2021-12-19 MED ORDER — HEPARIN SODIUM (PORCINE) 1000 UNIT/ML IJ SOLN
INTRAMUSCULAR | Status: AC
  Filled 2021-12-19: qty 10

## 2021-12-19 MED ORDER — CLINDAMYCIN PHOSPHATE 300 MG/50ML IV SOLN
INTRAVENOUS | Status: DC | PRN
Start: 2021-12-19 — End: 2021-12-19
  Administered 2021-12-19: 300 mg via INTRAVENOUS

## 2021-12-19 MED ORDER — MIDAZOLAM HCL 2 MG/2ML IJ SOLN
INTRAMUSCULAR | Status: DC
Start: 2021-12-19 — End: 2021-12-19
  Filled 2021-12-19: qty 2

## 2021-12-19 MED ORDER — FENTANYL CITRATE (PF) 100 MCG/2ML IJ SOLN
INTRAMUSCULAR | Status: DC | PRN
Start: 2021-12-19 — End: 2021-12-19
  Administered 2021-12-19: 50 ug via INTRAVENOUS

## 2021-12-19 MED ORDER — FENTANYL CITRATE PF 50 MCG/ML IJ SOSY
PREFILLED_SYRINGE | INTRAMUSCULAR | Status: AC
  Filled 2021-12-19: qty 1

## 2021-12-19 MED ORDER — METHYLPREDNISOLONE SODIUM SUCC 125 MG IJ SOLR: 125.0000 mg | Freq: Once | INTRAMUSCULAR | Status: DC | PRN

## 2021-12-19 MED ORDER — HEPARIN SODIUM (PORCINE) 1000 UNIT/ML IJ SOLN
INTRAMUSCULAR | Status: DC | PRN
  Administered 2021-12-19: 3000 [IU] via INTRAVENOUS

## 2021-12-19 MED ORDER — FAMOTIDINE 20 MG PO TABS: 40.0000 mg | ORAL_TABLET | Freq: Once | ORAL | Status: DC | PRN

## 2021-12-19 MED ORDER — MIDAZOLAM HCL 2 MG/ML PO SYRP
ORAL_SOLUTION | ORAL | Status: AC
  Filled 2021-12-19: qty 4

## 2021-12-19 MED ORDER — IODIXANOL 320 MG/ML IV SOLN
INTRAVENOUS | Status: DC | PRN
  Administered 2021-12-19: 70 mL via INTRAVENOUS

## 2021-12-19 SURGICAL SUPPLY — 15 items
CANISTER PENUMBRA ENGINE (MISCELLANEOUS) ×1 IMPLANT
CATH ANGIO 5F PIGTAIL 100CM (CATHETERS) ×1 IMPLANT
CATH INFINITI JR4 5F (CATHETERS) ×1 IMPLANT
CATH LIGHTNI FLASH 16XTORQ 100 (CATHETERS) IMPLANT
CATH LIGHTNING FLASH XTORQ 100 (CATHETERS) ×2
COVER PROBE U/S 5X48 (MISCELLANEOUS) ×1 IMPLANT
DEVICE SAFEGUARD 24CM (GAUZE/BANDAGES/DRESSINGS) ×1 IMPLANT
DRYSEAL FLEXSHEATH 16FR 33CM (SHEATH) ×1
GLIDEWIRE ADV .035X260CM (WIRE) ×1 IMPLANT
PACK ANGIOGRAPHY (CUSTOM PROCEDURE TRAY) ×3 IMPLANT
SHEATH DRYSEAL FLEX 16FR 33CM (SHEATH) IMPLANT
SHEATH PINNACLE 11FRX10 (SHEATH) ×1 IMPLANT
SYR MEDRAD MARK 7 150ML (SYRINGE) ×1 IMPLANT
TUBING CONTRAST HIGH PRESS 72 (TUBING) ×1 IMPLANT
WIRE GUIDERIGHT .035X150 (WIRE) ×1 IMPLANT

## 2021-12-19 NOTE — Op Note (Signed)
[facility]  Percutaneous Study/Intervention Procedural Note   Date of Surgery: 12/19/2021,11:40 AM  Surgeon: Leotis Pain  Pre-operative Diagnosis: Symptomatic bilateral pulmonary emboli  Post-operative diagnosis:  Same  Procedure(s) Performed:  1.  Contrast injection right heart  2.   Mechanical thrombectomy using the penumbra 16 lightning-Flash device in the right lower lobe and left lower lobe pulmonary arteries  4.  Selective catheter placement right lower lobe pulmonary artery  5.  Selective catheter placement left lower lobe pulmonary    Anesthesia: Conscious sedation was administered under my direct supervision by the interventional radiology RN. IV Versed plus fentanyl were utilized. Continuous ECG, pulse oximetry and blood pressure was monitored throughout the entire procedure.  Versed and fentanyl were administered intravenously.  Conscious sedation was administered for a total of 34 minutes using 2 of Versed and 50 mcg of Fentanyl.  EBL: 350 cc  Sheath: 16 Fr right femoral vein  Contrast: 70 cc   Fluoroscopy Time: 10.3 minutes  Indications:  Patient presents with pulmonary emboli. The patient is symptomatic with hypoxemia and dyspnea on exertion.  There is evidence of right heart strain on the CT angiogram. The patient is otherwise a good candidate for intervention and even the long-term benefits pulmonary angiography with thrombolysis is offered. The risks and benefits are reviewed long-term benefits are discussed. All questions are answered patient agrees to proceed.  Procedure:  Krista Kattner Harrelsonis a 56 y.o. female who was identified and appropriate procedural time out was performed.  The patient was then placed supine on the table and prepped and draped in the usual sterile fashion.  Ultrasound was used to evaluate the right common femoral vein.  It was patent, as it was echolucent and compressible.  A digital ultrasound image was acquired for  the permanent record.  A Seldinger needle was used to access the right common femoral vein under direct ultrasound guidance.  A 0.035 J wire was advanced without resistance and a 5Fr sheath was placed and then upsized to a 16 Fr [place] sheath.    The wire and pigtail catheter were then negotiated into the right atrium and bolus injection of contrast was utilized to demonstrate the right ventricle and the pulmonary artery outflow. The wire and catheter were then negotiated into the main pulmonary artery where hand injection of contrast was utilized to demonstrate the pulmonary arteries and confirm the locations of the pulmonary emboli.  The JR4 catheter was advanced into the left lower lobe where selective imaging was performed showing thrombus consistent with the CT scan.  It was then flipped to the right side and selective imaging of the right lower lobe showed thrombus consistent with the CT scan.  3000 Units of heparin was then given and allowed to circulate.  tPA was not given due to the chronicity of the thrombus.  The Penumbra Cat 16 Lightning/Flash catheter was then advanced up into the pulmonary vasculature. The right lung was addressed first.  The catheter was advanced into the right lower lobe where mechanical thrombectomy was performed with the penumbra device until about 175 to 200 cc of blood was returned.  Completion imaging showed reasonably good flow in the right lower lobe.  The Penumbra Cat 16 lightening/flash catheter was then negotiated to the opposite side. The left lung was then addressed. Catheter was negotiated into the left lower lobe pulmonary artery and mechanical thrombectomy was performed. Follow-up imaging demonstrated a good result.  After review these images wires were reintroduced and the  catheters removed. Then, the sheath is then pulled and pressures held. A safeguard is placed.    Findings:   Right heart imaging:  Right atrium and right ventricle and the pulmonary  outflow tract appears relatively normal  Right lung: Thrombus at the origin of the right middle lobe and in the right lower lobe with no right upper lobe thrombus seen  Left lung: Thrombus in the left lower lobe and distal main pulmonary artery with no thrombus seen in the left upper lobe    Disposition: Patient was taken to the recovery room in stable condition having tolerated the procedure well.  Krista Blake 12/19/2021,11:40 AM

## 2021-12-19 NOTE — Interval H&P Note (Signed)
History and Physical Interval Note:  12/19/2021 9:33 AM  Krista Blake  has presented today for surgery, with the diagnosis of Pulmonary Thrombectomy   PE.  The various methods of treatment have been discussed with the patient and family. After consideration of risks, benefits and other options for treatment, the patient has consented to  Procedure(s): PULMONARY THROMBECTOMY (N/A) as a surgical intervention.  The patient's history has been reviewed, patient examined, no change in status, stable for surgery.  I have reviewed the patient's chart and labs.  Questions were answered to the patient's satisfaction.     Leotis Pain

## 2021-12-20 ENCOUNTER — Telehealth (INDEPENDENT_AMBULATORY_CARE_PROVIDER_SITE_OTHER): Payer: Self-pay

## 2021-12-20 NOTE — Telephone Encounter (Signed)
Patient had a pulmonary thrombectomy with Dr. Lucky Cowboy on 12/19/21 and is calling wanting to know what happened and have so clarity. Please advise. Thank you

## 2021-12-28 ENCOUNTER — Telehealth (INDEPENDENT_AMBULATORY_CARE_PROVIDER_SITE_OTHER): Payer: Self-pay

## 2021-12-28 NOTE — Telephone Encounter (Addendum)
Patient left a message stating that she feeling extremely tired,winded,fluttering in the chest,and feel like she can't get complete deep breathe for the past several days. The patient had pulmonary thrombectomy on 12/19/21. I spoke with Arna Medici NP and she advise for the patient to evaluated at the ED. Patient was made aware with medical recommendations and verbalized understanding. ?

## 2022-01-17 ENCOUNTER — Encounter (INDEPENDENT_AMBULATORY_CARE_PROVIDER_SITE_OTHER): Payer: Self-pay | Admitting: Nurse Practitioner

## 2022-01-17 ENCOUNTER — Ambulatory Visit (INDEPENDENT_AMBULATORY_CARE_PROVIDER_SITE_OTHER): Payer: BC Managed Care – PPO | Admitting: Nurse Practitioner

## 2022-01-17 ENCOUNTER — Other Ambulatory Visit: Payer: Self-pay

## 2022-01-17 VITALS — BP 118/84 | HR 81 | Resp 16 | Ht 64.0 in | Wt 233.4 lb

## 2022-01-17 DIAGNOSIS — I10 Essential (primary) hypertension: Secondary | ICD-10-CM | POA: Diagnosis not present

## 2022-01-17 DIAGNOSIS — I2699 Other pulmonary embolism without acute cor pulmonale: Secondary | ICD-10-CM | POA: Diagnosis not present

## 2022-01-23 ENCOUNTER — Telehealth (INDEPENDENT_AMBULATORY_CARE_PROVIDER_SITE_OTHER): Payer: Self-pay

## 2022-01-24 NOTE — Telephone Encounter (Signed)
Referral placed.

## 2022-01-24 NOTE — Telephone Encounter (Signed)
Patient made aware.

## 2022-01-29 ENCOUNTER — Encounter (INDEPENDENT_AMBULATORY_CARE_PROVIDER_SITE_OTHER): Payer: Self-pay | Admitting: Nurse Practitioner

## 2022-01-29 NOTE — Progress Notes (Signed)
? ?Subjective:  ? ? Patient ID: Krista Blake, female    DOB: 1966/09/16, 56 y.o.   MRN: [id number] ?Chief Complaint  ?Patient presents with  ? Follow-up  ? ? ?Krista Blake is a 56 year old female that returns today in regards to her pulmonary embolism as well as left lower extremity DVT.  The patient recently had a surgery for her meniscus however it became infected and she has had to have multiple surgeries from the initial surgery.  She has also had to undergo multiple rounds of IV antibiotics and is currently on oral antibiotics that repeat regularly every 6 weeks.  On 11/13/2021 the patient noticed swelling in her left lower extremity as well as some shortness of breath.  She went to the emergency room for evaluation and it was found that the patient had a distal femoral DVT as well as a large burden bilateral pulmonary embolism.  The patient was initiated on heparin drip and started on Eliquis and ultimately discharged to the hospital 3 days later.  The patient was slowly starting to improve until 12/05/2021 where she was at a [employer] party and began to notice pain with taking a deep breath.  This pain became more consistent and she subsequently began to have severe nausea, vomiting and diarrhea.  Due to these reactions and symptoms which contact her PCP she was urged to go immediately to the emergency room.  Reevaluation in the emergency room showed continued pulmonary embolism although there has been very mild improvement.  The patient underwent pulmonary thrombectomy on 12/19/2021 to attempt to debulk the pulmonary embolism due to the significant symptoms the patient continues to experience however very little thrombus was able to be removed. ? ? ?Review of Systems  ?Respiratory:  Positive for shortness of breath (with exertion).   ?Musculoskeletal:  Positive for arthralgias.  ?All other systems reviewed and are negative. ? ?   ?Objective:  ? Physical Exam ?Vitals reviewed.  ?HENT:  ?   Head:  Normocephalic.  ?Cardiovascular:  ?   Rate and Rhythm: Normal rate.  ?   Pulses: Normal pulses.  ?Pulmonary:  ?   Effort: Pulmonary effort is normal.  ?Skin: ?   General: Skin is warm and dry.  ?Neurological:  ?   Mental Status: She is alert and oriented to person, place, and time.  ?Psychiatric:     ?   Mood and Affect: Mood normal.     ?   Behavior: Behavior normal.     ?   Thought Content: Thought content normal.     ?   Judgment: Judgment normal.  ? ? ?BP 118/84 (BP Location: Right Arm)   Pulse 81   Resp 16   Ht '5\' 4"'$  (1.626 m)   Wt 233 lb 6.4 oz (105.9 kg)   LMP  (LMP Unknown)   BMI 40.06 kg/m?  ? ?Past Medical History:  ?Diagnosis Date  ? Bulging lumbar disc   ? History of mammogram 03/04/2015  ? BIRAD 1  ? History of Papanicolaou smear of cervix 02/09/2016  ? NIL/NEG  ? Hypertension   ? Pap smear abnormality of cervix/human papillomavirus (HPV) positive 02/05/2015  ? NIL Pap with positive HPV aptima.  HPV 16,18.45 neg  ? Screening for colon cancer 07/2018  ? neg Cologuard; repeat in 3 yrs  ? ? ?Social History  ? ?Socioeconomic History  ? Marital status: Single  ?  Spouse name: Not on file  ? Number of children: Not on file  ?  Years of education: Not on file  ? Highest education level: Not on file  ?Occupational History  ? Not on file  ?Tobacco Use  ? Smoking status: Never  ? Smokeless tobacco: Never  ?Vaping Use  ? Vaping Use: Never used  ?Substance and Sexual Activity  ? Alcohol use: Yes  ?  Alcohol/week: 0.0 standard drinks  ?  Comment: social  ? Drug use: No  ? Sexual activity: Not Currently  ?  Birth control/protection: Pill  ?Other Topics Concern  ? Not on file  ?Social History Narrative  ? Not on file  ? ?Social Determinants of Health  ? ?Financial Resource Strain: Not on file  ?Food Insecurity: Not on file  ?Transportation Needs: Not on file  ?Physical Activity: Not on file  ?Stress: Not on file  ?Social Connections: Not on file  ?Intimate Partner Violence: Not on file  ? ? ?Past Surgical  History:  ?Procedure Laterality Date  ? I & D EXTREMITY Left 05/04/2021  ? Procedure: KNEE ARTHROSCOPY WITH IRRIGATION AND DEBRIDEMENT;  Surgeon: Corky Mull, MD;  Location: [facility];  Service: Orthopedics;  Laterality: Left;  ? KNEE ARTHROSCOPY Left 06/07/2021  ? Procedure: ARTHROSCOPIC IRRIGATION AND DEBRIDEMENT;  Surgeon: Corky Mull, MD;  Location: [facility];  Service: Orthopedics;  Laterality: Left;  ? KNEE ARTHROSCOPY Left 08/03/2021  ? Procedure: LEFT KNEE REPEAT IRRIGATION AND DEBRIDEMENT WITH BONE CULTURES. [place];  Surgeon: Corky Mull, MD;  Location: [facility];  Service: Orthopedics;  Laterality: Left;  ? KNEE ARTHROSCOPY Left 08/08/2021  ? Procedure: ARTHROSCOPY KNEE;  Surgeon: Corky Mull, MD;  Location: [facility];  Service: Orthopedics;  Laterality: Left;  ? PULMONARY THROMBECTOMY N/A 12/19/2021  ? Procedure: PULMONARY THROMBECTOMY;  Surgeon: Algernon Huxley, MD;  Location: [place] [facility];  Service: Cardiovascular;  Laterality: N/A;  ? ? ?Family History  ?Problem Relation Age of Onset  ? Hypertension Father   ? Diabetes Paternal Uncle   ? Diabetes Paternal Grandfather   ? Ovarian cancer Neg Hx   ? Breast cancer Neg Hx   ? Colon cancer Neg Hx   ? ? ?Allergies  ?Allergen Reactions  ? Penicillins Hives  ?  Reaction : Hives during college ?TOLERATED CEFAZOLIN AND AMOXICILLIN  ? ? ? ?  Latest Ref Rng & Units 12/05/2021  ?  5:49 PM 11/15/2021  ?  3:11 AM 11/14/2021  ?  6:00 AM  ?CBC  ?WBC 4.0 - 10.5 K/uL 8.5   9.0   9.1    ?Hemoglobin 12.0 - 15.0 g/dL 14.4   11.4   11.8    ?Hematocrit 36.0 - 46.0 % 43.2   32.8   34.3    ?Platelets 150 - 400 K/uL 218   160   144    ? ? ? ? ?CMP  ?   ?Component Value Date/Time  ? NA 136 12/05/2021 1749  ? NA 140 08/07/2019 0819  ? K 2.8 (L) 12/05/2021 1749  ? CL 100 12/05/2021 1749  ? CO2 25 12/05/2021 1749  ? GLUCOSE 162 (H) 12/05/2021 1749  ? BUN 16 12/19/2021 0939  ? BUN 12 08/07/2019 0819  ? CREATININE 0.71 12/19/2021 0939  ? CALCIUM 9.5 12/05/2021 1749  ? PROT 7.8  12/05/2021 1749  ? PROT 6.9 08/07/2019 0819  ? ALBUMIN 4.4 12/05/2021 1749  ? ALBUMIN 4.4 08/07/2019 0819  ? AST 24 12/05/2021 1749  ? ALT 19 12/05/2021 1749  ? ALKPHOS 93 12/05/2021 1749  ? BILITOT 0.8  12/05/2021 1749  ? BILITOT 0.4 08/07/2019 0819  ? GFRNONAA >60 12/19/2021 0939  ? GFRAA 102 08/07/2019 0819  ? ? ? ?No results found. ? ?   ?Assessment & Plan:  ? ?1. Acute pulmonary embolism without acute cor pulmonale, unspecified pulmonary embolism type ([place]) ?The patient still has continued issues of shortness of breath following pulmonary embolism.  We will refer the patient to pulmonology.  We will also have her return in 3 months for a DVT study to evaluate the progress of her left lower extremity DVT. ?- Ambulatory referral to Pulmonology ? ?2. Essential hypertension ?Continue antihypertensive medications as already ordered, these medications have been reviewed and there are no changes at this time.  ? ? ?Current Outpatient Medications on File Prior to Visit  ?Medication Sig Dispense Refill  ? acetaminophen (TYLENOL) 650 MG CR tablet Take 1,300 mg by mouth every 8 (eight) hours as needed for pain.    ? ADVAIR DISKUS 100-50 MCG/ACT AEPB SMARTSIG:1 Inhalation Via Inhaler Every 12 Hours    ? allopurinol (ZYLOPRIM) 100 MG tablet Take 100 mg by mouth daily.    ? apixaban (ELIQUIS) 5 MG TABS tablet Take 1 tablet (5 mg total) by mouth 2 (two) times daily. Start after you finish starter pack 60 tablet 2  ? APIXABAN (ELIQUIS) VTE STARTER PACK ('10MG'$  AND '5MG'$ ) Take as directed on package: start with two-'5mg'$  tablets twice daily for 7 days. On day 8, switch to one-'5mg'$  tablet twice daily. 1 each 0  ? calcium gluconate 500 MG tablet Take 1 tablet by mouth daily.    ? cephALEXin (KEFLEX) 500 MG capsule Take 500 mg by mouth 4 (four) times daily.    ? Cholecalciferol (VITAMIN D3) 1.25 MG (50000 UT) CAPS Take 1 capsule by mouth daily.    ? hydrochlorothiazide (HYDRODIURIL) 25 MG tablet Take 25 mg by mouth daily.    ?  HYDROcodone-acetaminophen (NORCO/VICODIN) 5-325 MG tablet Take 1-2 tablets by mouth every 4 (four) hours as needed for moderate pain or severe pain. 60 tablet 0  ? Magnesium 250 MG TABS Take 250 mg by mouth daily.    ? methoca

## 2022-04-06 ENCOUNTER — Ambulatory Visit (INDEPENDENT_AMBULATORY_CARE_PROVIDER_SITE_OTHER): Payer: BC Managed Care – PPO | Admitting: Pulmonary Disease

## 2022-04-06 ENCOUNTER — Encounter: Payer: Self-pay | Admitting: Pulmonary Disease

## 2022-04-06 VITALS — BP 126/82 | HR 75 | Temp 98.1°F | Ht 64.0 in | Wt 247.0 lb

## 2022-04-06 DIAGNOSIS — I2699 Other pulmonary embolism without acute cor pulmonale: Secondary | ICD-10-CM | POA: Diagnosis not present

## 2022-04-06 DIAGNOSIS — R0602 Shortness of breath: Secondary | ICD-10-CM | POA: Diagnosis not present

## 2022-04-06 NOTE — Patient Instructions (Signed)
We are going to check another scan to evaluate the status of your clots.  We will see you in follow-up in 6 to 8 weeks time call sooner should any new problems arise.

## 2022-04-06 NOTE — Progress Notes (Signed)
Subjective:    Patient ID: Krista Blake, female    DOB: 1965-11-28, 56 y.o.   MRN: [id number] Patient Care Team: Idelle Crouch, MD as PCP - General (Internal Medicine)  Chief Complaint  Patient presents with   pulmonary consult    CTA 12/05/2021-no current sx.    HPI The patient is a 56 year old lifelong never smoker who presents for the issue of shortness of breath in the setting of prior submassive PE with multiple pulmonary infarctions January 2023.  She is kindly referred by Eulogio Ditch NP.  Her primary care physician is Dr. Fulton Reek.  Patient's issues began after LEFT meniscus surgery in July 2022 after that she developed methicillin-sensitive staph infection in the knee and that led to multiple surgical procedures for debridement and prolonged antibiotic therapy.  He did not receive DVT prophylaxis during this time.  In January 2023 she developed left lower extremity edema and shortness of breath.  She had a CT angio performed which showed submassive PE with large burden of embolus on the left and right pulmonary arteries as well as the lobar and segmental arteries bilaterally.  There was global cardiomegaly and multiple bilateral subpleural groundglass airspace opacities consistent with multifocal pulmonary infarction.  Patient was admitted at that time.  With anticoagulants.  She was presumed to have no right heart strain by CT alone however, a 2D echo was not done at that time.  He was discharged and initially did well but on 13 February presented to the emergency room again with pleuritic chest pain discharged from the ED with continuation of her medications.  CTA performed at that time showed some resolution of the previously noted PE but persistent thrombus present.  Subsequently she was seen by vascular surgery who performed thrombectomy since then she has been she has been feeling better overall though her dyspnea persists.  She has had cardiac evaluation with a 2D echo  that was essentially normal.  This was done [facility].  Has developed some issues with edema that have responded to diuretics.  She does have persistent waxing and waning edema of the left lower extremity.  As noted her shortness of breath has been improving.  She notes that only when she is doing inclines.  Overall she is just very anxious about all of what has happened to her.  She states that she just wants reassurance.  She does not endorse any other symptomatology today.  Overall she feels well and looks well.   Review of Systems A 10 point review of systems was performed and it is as noted above otherwise negative.  Past Medical History:  Diagnosis Date   Bulging lumbar disc    History of mammogram 03/04/2015   BIRAD 1   History of Papanicolaou smear of cervix 02/09/2016   NIL/NEG   Hypertension    Pap smear abnormality of cervix/human papillomavirus (HPV) positive 02/05/2015   NIL Pap with positive HPV aptima.  HPV 16,18.45 neg   Screening for colon cancer 07/2018   neg Cologuard; repeat in 3 yrs   Past Surgical History:  Procedure Laterality Date   I & D EXTREMITY Left 05/04/2021   Procedure: KNEE ARTHROSCOPY WITH IRRIGATION AND DEBRIDEMENT;  Surgeon: Corky Mull, MD;  Location: [facility];  Service: Orthopedics;  Laterality: Left;   KNEE ARTHROSCOPY Left 06/07/2021   Procedure: ARTHROSCOPIC IRRIGATION AND DEBRIDEMENT;  Surgeon: Corky Mull, MD;  Location: [facility];  Service: Orthopedics;  Laterality: Left;   KNEE  ARTHROSCOPY Left 08/03/2021   Procedure: LEFT KNEE REPEAT IRRIGATION AND DEBRIDEMENT WITH BONE CULTURES. [place];  Surgeon: Corky Mull, MD;  Location: [facility];  Service: Orthopedics;  Laterality: Left;   KNEE ARTHROSCOPY Left 08/08/2021   Procedure: ARTHROSCOPY KNEE;  Surgeon: Corky Mull, MD;  Location: [facility];  Service: Orthopedics;  Laterality: Left;   PULMONARY THROMBECTOMY N/A 12/19/2021   Procedure: PULMONARY THROMBECTOMY;  Surgeon:  Algernon Huxley, MD;  Location: [place] [facility];  Service: Cardiovascular;  Laterality: N/A;   Patient Active Problem List   Diagnosis Date Noted   Pulmonary embolism ([place]) 11/13/2021   Obesity (BMI 30-39.9) 11/13/2021   Hypokalemia 11/13/2021   Septic arthritis of knee, left ([place]) 06/07/2021   Pre-diabetes 11/09/2020   Elevated blood pressure reading without diagnosis of hypertension 08/05/2019   Essential hypertension 11/04/2014   Family History  Problem Relation Age of Onset   Hypertension Father    Diabetes Paternal Uncle    Diabetes Paternal Grandfather    Ovarian cancer Neg Hx    Breast cancer Neg Hx    Colon cancer Neg Hx    Social History   Tobacco Use   Smoking status: Never   Smokeless tobacco: Never  Substance Use Topics   Alcohol use: Yes    Alcohol/week: 0.0 standard drinks of alcohol    Comment: social   Allergies  Allergen Reactions   Penicillins Hives    Reaction : Hives during college TOLERATED CEFAZOLIN AND AMOXICILLIN   Current Meds  Medication Sig   acetaminophen (TYLENOL) 650 MG CR tablet Take 1,300 mg by mouth every 8 (eight) hours as needed for pain.   ADVAIR DISKUS 100-50 MCG/ACT AEPB SMARTSIG:1 Inhalation Via Inhaler Every 12 Hours   allopurinol (ZYLOPRIM) 100 MG tablet Take 100 mg by mouth daily.   apixaban (ELIQUIS) 5 MG TABS tablet Take 1 tablet (5 mg total) by mouth 2 (two) times daily. Start after you finish starter pack   APIXABAN (ELIQUIS) VTE STARTER PACK ('10MG'$  AND '5MG'$ ) Take as directed on package: start with two-'5mg'$  tablets twice daily for 7 days. On day 8, switch to one-'5mg'$  tablet twice daily.   benzonatate (TESSALON) 100 MG capsule Take 1 capsule (100 mg total) by mouth 2 (two) times daily.   calcium gluconate 500 MG tablet Take 1 tablet by mouth daily.   Cholecalciferol (VITAMIN D3) 1.25 MG (50000 UT) CAPS Take 1 capsule by mouth daily.   hydrochlorothiazide (HYDRODIURIL) 25 MG tablet Take 25 mg by mouth daily.   Magnesium 250 MG  TABS Take 250 mg by mouth daily.   propranolol (INDERAL) 40 MG tablet Take 20 mg by mouth daily.   [DISCONTINUED] cephALEXin (KEFLEX) 500 MG capsule Take 500 mg by mouth 4 (four) times daily.   [DISCONTINUED] ondansetron (ZOFRAN) 4 MG tablet Take 1 tablet (4 mg total) by mouth every 8 (eight) hours as needed for nausea or vomiting.    There is no immunization history on file for this patient.     Objective:   Physical Exam BP 126/82 (BP Location: Left Arm, Cuff Size: Large)   Pulse 75   Temp 98.1 F (36.7 C) (Temporal)   Ht '5\' 4"'$  (1.626 m)   Wt 247 lb (112 kg)   LMP  (LMP Unknown)   SpO2 96%   BMI 42.40 kg/m  GENERAL: Obese woman, no acute distress.  Fully ambulatory, conversational dyspnea HEAD: Normocephalic, atraumatic.  EYES: Pupils equal, round, reactive to light.  No scleral icterus.  MOUTH: Oral mucosa moist.  No thrush. NECK: Supple. No thyromegaly. Trachea midline. No JVD.  No adenopathy. PULMONARY: Good air entry bilaterally.  No adventitious sounds. CARDIOVASCULAR: S1 and S2. Regular rate and rhythm.  No rubs, murmurs or gallops heard. ABDOMEN: Obese otherwise benign. MUSCULOSKELETAL: No joint deformity, no clubbing, no edema.  NEUROLOGIC: No overt focal deficit, no gait disturbance, speech is fluent. SKIN: Intact,warm,dry. PSYCH: Mood and behavior normal.     Assessment & Plan:     ICD-10-CM   1. Pulmonary embolism and infarction [facility])  I26.99 CT Angio Chest Pulmonary Embolism (PE) W or WO Contrast   Provoked PE, submassive No residual RV dysfunction Status post thrombectomy CT angio to reassess    2. Shortness of breath  R06.02    Suspect now mostly due to deconditioning Obesity adding to sensation of dyspnea    3. Severe obesity (BMI >= 40) (HCC)  E66.01    Recommend weight loss     Orders Placed This Encounter  Procedures   CT Angio Chest Pulmonary Embolism (PE) W or WO Contrast    Standing Status:   Future    Standing Expiration Date:    04/07/2023    Scheduling Instructions:     Next available.    Order Specific Question:   If indicated for the ordered procedure, I authorize the administration of contrast media per Radiology protocol    Answer:   Yes    Order Specific Question:   Is patient pregnant?    Answer:   No    Order Specific Question:   Preferred imaging location?    Answer:   [facility]   We will check another CT angio chest to evaluate the status of the patient's pulmonary emboli and results post thrombectomy.  We will see the patient in follow-up in 6 to 8 weeks time, she is to call sooner should any new difficulties arise.   Renold Don, MD Advanced Bronchoscopy P[facility]    *This note was dictated using voice recognition software/Dragon.  Despite best efforts to proofread, errors can occur which can change the meaning. Any transcriptional errors that result from this process are unintentional and may not be fully corrected at the time of dictation.

## 2022-04-11 ENCOUNTER — Ambulatory Visit
Admission: RE | Admit: 2022-04-11 | Discharge: 2022-04-11 | Disposition: A | Discharge status: Home / Self Care | Payer: BC Managed Care – PPO | Source: Ambulatory Visit | Attending: Pulmonary Disease | Admitting: Pulmonary Disease

## 2022-04-11 DIAGNOSIS — I2699 Other pulmonary embolism without acute cor pulmonale: Secondary | ICD-10-CM | POA: Diagnosis present

## 2022-04-11 IMAGING — CT CT ANGIO CHEST
2 of 7 series · 18 of 46 positions shown · IV contrast (APPLIED)
Comparison: Previous studies including the examination of
[DATE]

CLINICAL DATA: Pulmonary embolism follow-up

EXAM:
CT ANGIOGRAPHY CHEST WITH CONTRAST
TECHNIQUE: Multidetector CT imaging of the chest was performed using the
standard protocol during bolus administration of intravenous
contrast. Multiplanar CT image reconstructions and MIPs were
obtained to evaluate the vascular anatomy.

[Series 4: thins · axial · 0.76mm/px · z∈[-734,-490]mm · 15 of 340 slices shown]
[im 18/340  lung]
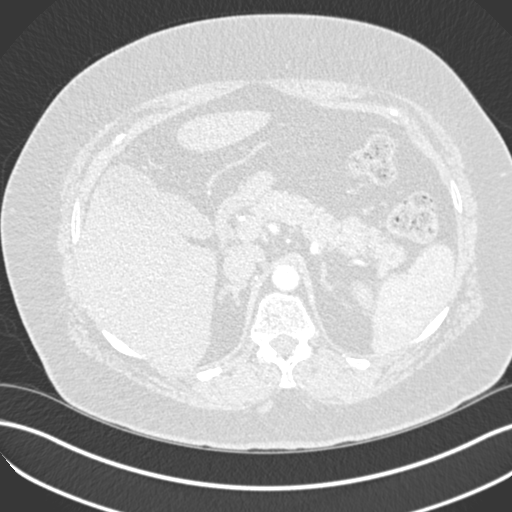
[im 36/340  soft-tissue]
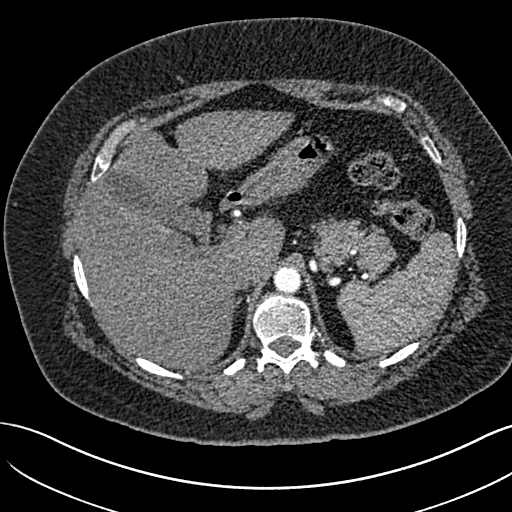
[im 72/340  lung]
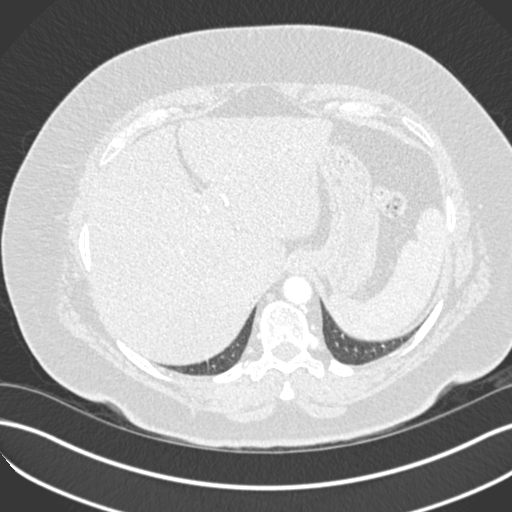
[im 90/340  soft-tissue]
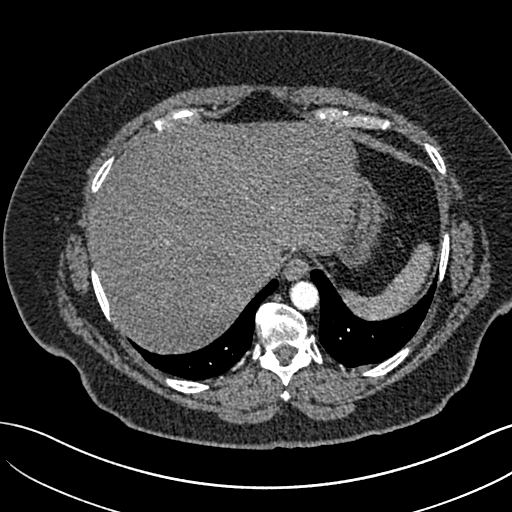
[im 108/340  lung]
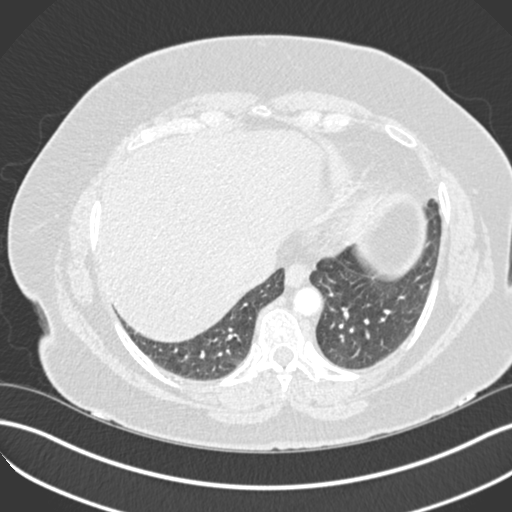
[im 125/340  soft-tissue]
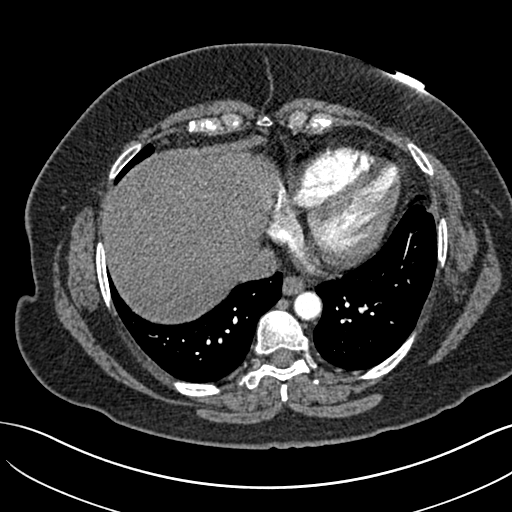
[im 143/340  lung]
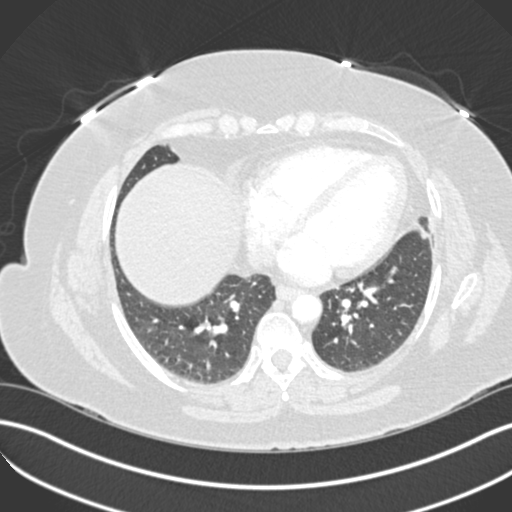
[im 179/340  soft-tissue]
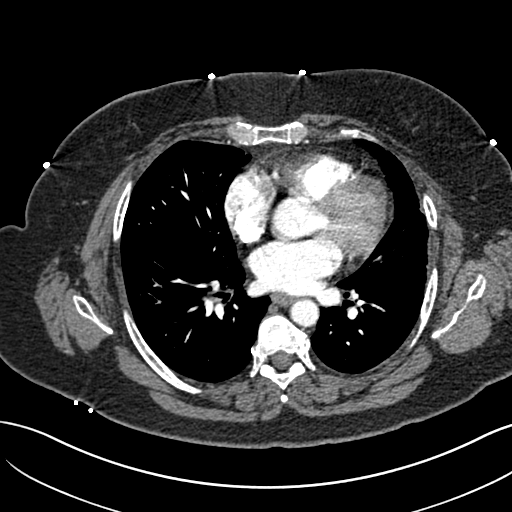
[im 197/340  lung]
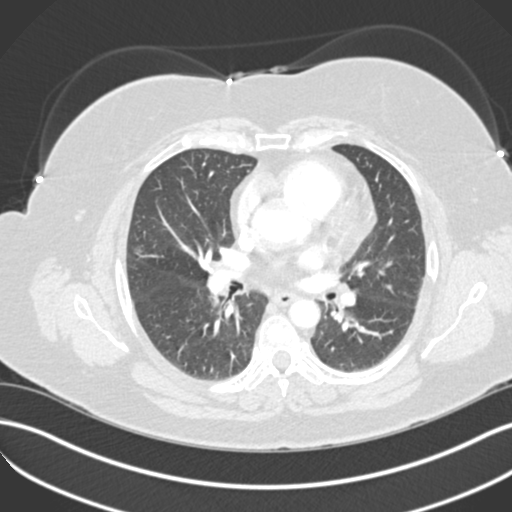
[im 215/340  soft-tissue]
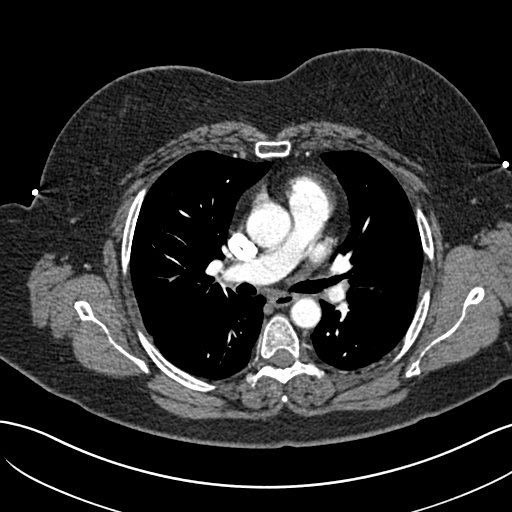
[im 232/340  lung]
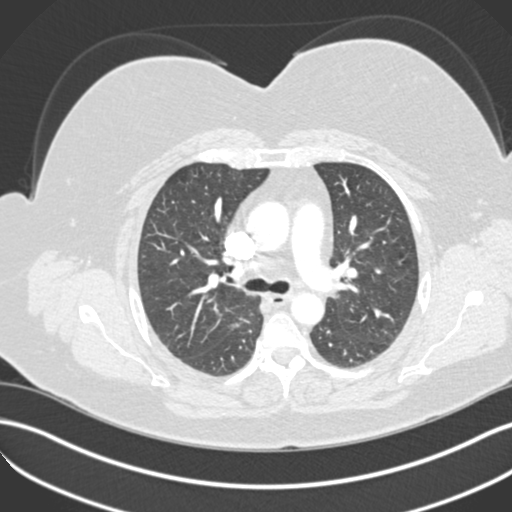
[im 250/340  soft-tissue]
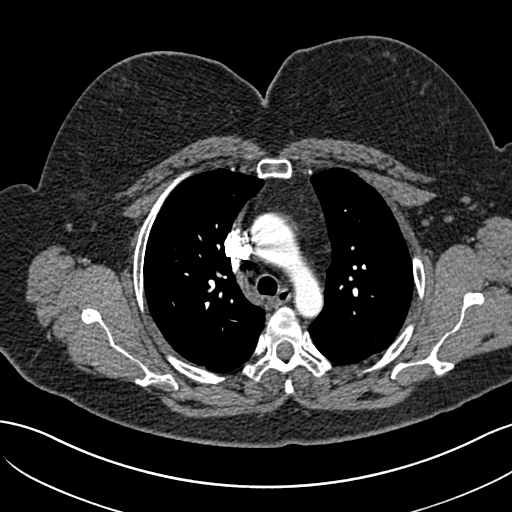
[im 286/340  lung]
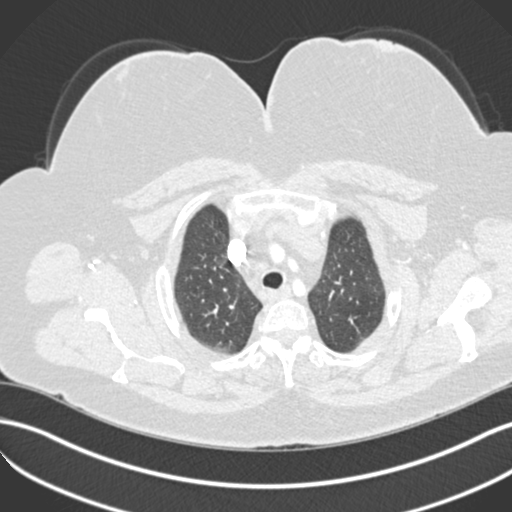
[im 304/340  soft-tissue]
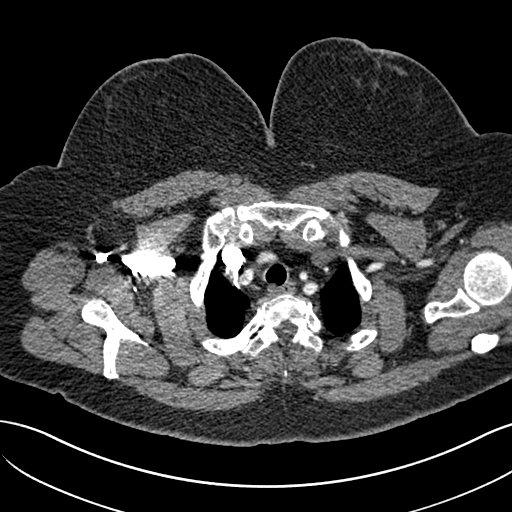
[im 322/340  lung]
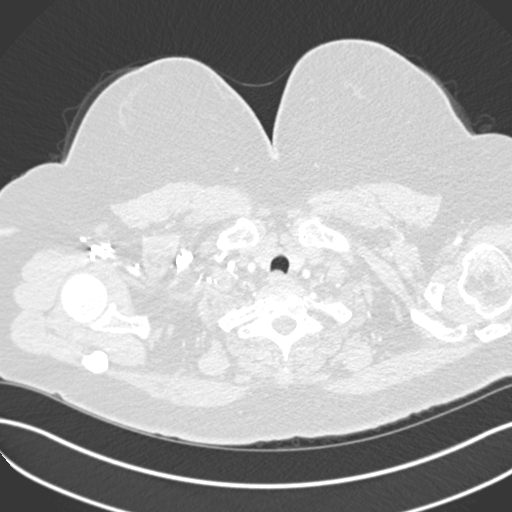

[Series 7: coronal mpr · coronal · 0.53mm/px · 3 of 107 slices shown]
[im 27/107  soft-tissue]
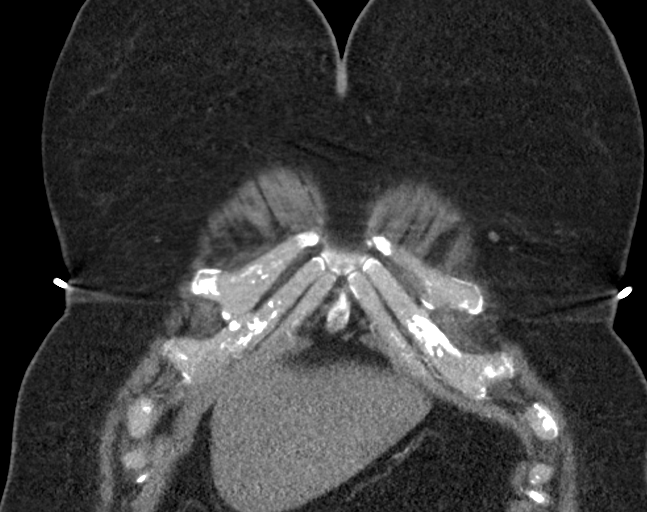
[im 54/107  soft-tissue]
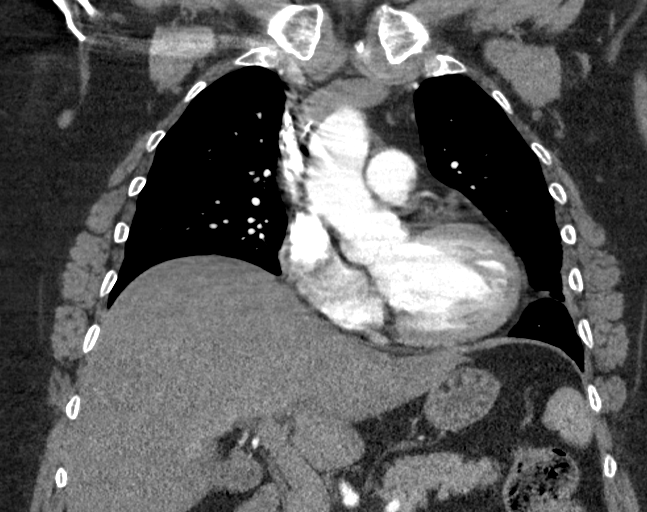
[im 80/107  soft-tissue]
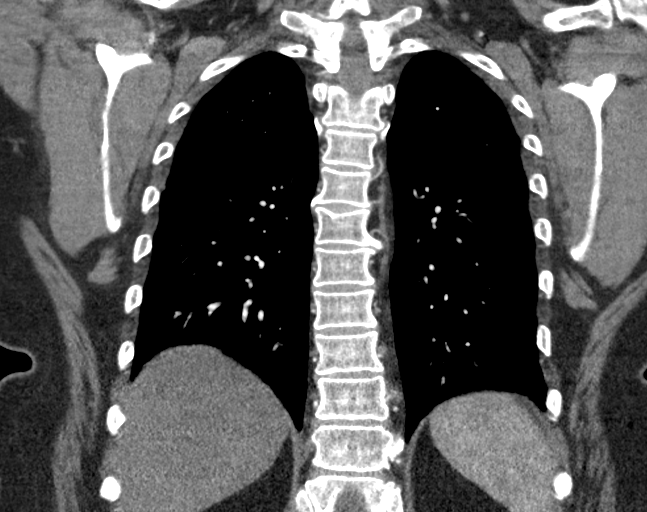

[18 of 46 positions shown; findings below may reference images not displayed]

RADIATION DOSE REDUCTION: This exam was performed according to the
departmental dose-optimization program which includes automated
exposure control, adjustment of the mA and/or kV according to
patient size and/or use of iterative reconstruction technique.

CONTRAST:  75mL OMNIPAQUE IOHEXOL 350 MG/ML SOLN
FINDINGS: Cardiovascular: There is homogeneous enhancement in thoracic aorta.
There are no intraluminal filling defects in the pulmonary artery
branches. There is interval resolution of filling defects seen in
the left main pulmonary artery and right lower lobe pulmonary
artery. There is no evidence of right heart strain.

Mediastinum/Nodes: No significant lymphadenopathy seen.

Lungs/Pleura: There is no focal pulmonary consolidation. Small
linear densities in the lingula may suggest minimal subsegmental
atelectasis or scarring. There is no pleural effusion or
pneumothorax.

Upper Abdomen: There is fatty infiltration in the liver.

Musculoskeletal: No acute findings are seen.

Review of the MIP images confirms the above findings.
IMPRESSION: There is no evidence of pulmonary artery embolism. There is interval
resolution of filling defects in the pulmonary artery branches in
comparison with the study of [DATE].

There is no evidence of thoracic aortic dissection. No focal
pulmonary infiltrates are seen. Fatty liver.

## 2022-04-11 MED ORDER — IOHEXOL 350 MG/ML SOLN
75.0000 mL | Freq: Once | INTRAVENOUS | Status: AC | PRN
Start: 2022-04-11 — End: 2022-04-11
  Administered 2022-04-11: 75 mL via INTRAVENOUS

## 2022-04-18 ENCOUNTER — Encounter (INDEPENDENT_AMBULATORY_CARE_PROVIDER_SITE_OTHER): Payer: Self-pay | Admitting: Vascular Surgery

## 2022-04-18 ENCOUNTER — Ambulatory Visit (INDEPENDENT_AMBULATORY_CARE_PROVIDER_SITE_OTHER): Payer: BC Managed Care – PPO | Admitting: Vascular Surgery

## 2022-04-18 ENCOUNTER — Other Ambulatory Visit (INDEPENDENT_AMBULATORY_CARE_PROVIDER_SITE_OTHER): Payer: Self-pay | Admitting: Nurse Practitioner

## 2022-04-18 ENCOUNTER — Ambulatory Visit (INDEPENDENT_AMBULATORY_CARE_PROVIDER_SITE_OTHER): Payer: BC Managed Care – PPO

## 2022-04-18 VITALS — BP 135/69 | HR 74 | Resp 16 | Wt 247.0 lb

## 2022-04-18 DIAGNOSIS — I82432 Acute embolism and thrombosis of left popliteal vein: Secondary | ICD-10-CM

## 2022-04-18 DIAGNOSIS — I2699 Other pulmonary embolism without acute cor pulmonale: Secondary | ICD-10-CM | POA: Diagnosis not present

## 2022-04-18 DIAGNOSIS — R7303 Prediabetes: Secondary | ICD-10-CM | POA: Diagnosis not present

## 2022-04-18 DIAGNOSIS — I10 Essential (primary) hypertension: Secondary | ICD-10-CM | POA: Diagnosis not present

## 2022-04-18 NOTE — Progress Notes (Signed)
MRN : [id number]  Krista Blake is a 56 y.o. (11/24/1965) female who presents with chief complaint of  Chief Complaint  Patient presents with   Follow-up    Ultrasound follow up  .  History of Present Illness: Patient returns today in follow up of her DVT and PE.  She has been doing well on anticoagulation.  She has no current leg swelling or pain.  She denies any chest pain or shortness of breath.  She underwent pulmonary thrombectomy and had anticoagulation alone for her popliteal and tibial DVT that was residual a few months ago.  Duplex today shows resolution of the lower extremity DVT as well without evidence of current DVT.  Current Outpatient Medications  Medication Sig Dispense Refill   acetaminophen (TYLENOL) 650 MG CR tablet Take 1,300 mg by mouth every 8 (eight) hours as needed for pain.     ADVAIR DISKUS 100-50 MCG/ACT AEPB SMARTSIG:1 Inhalation Via Inhaler Every 12 Hours     allopurinol (ZYLOPRIM) 100 MG tablet Take 100 mg by mouth daily.     apixaban (ELIQUIS) 5 MG TABS tablet Take 1 tablet (5 mg total) by mouth 2 (two) times daily. Start after you finish starter pack 60 tablet 2   APIXABAN (ELIQUIS) VTE STARTER PACK ('10MG'$  AND '5MG'$ ) Take as directed on package: start with two-'5mg'$  tablets twice daily for 7 days. On day 8, switch to one-'5mg'$  tablet twice daily. 1 each 0   benzonatate (TESSALON) 100 MG capsule Take 1 capsule (100 mg total) by mouth 2 (two) times daily. 20 capsule 0   calcium gluconate 500 MG tablet Take 1 tablet by mouth daily.     Cholecalciferol (VITAMIN D3) 1.25 MG (50000 UT) CAPS Take 1 capsule by mouth daily.     furosemide (LASIX) 20 MG tablet Take 20 mg by mouth daily.     hydrochlorothiazide (HYDRODIURIL) 25 MG tablet Take 25 mg by mouth daily.     Magnesium 250 MG TABS Take 250 mg by mouth daily.     propranolol (INDERAL) 40 MG tablet Take 20 mg by mouth daily.     HYDROcodone-acetaminophen (NORCO/VICODIN) 5-325 MG tablet Take 1-2 tablets by  mouth every 4 (four) hours as needed for moderate pain or severe pain. (Patient not taking: Reported on 04/06/2022) 60 tablet 0   methocarbamol (ROBAXIN) 750 MG tablet Take 1 tablet (750 mg total) by mouth every 8 (eight) hours as needed for muscle spasms. (Patient not taking: Reported on 04/06/2022) 30 tablet 0   PARoxetine (PAXIL) 20 MG tablet Take 20 mg by mouth daily.     rosuvastatin (CRESTOR) 10 MG tablet Take 10 mg by mouth daily.     No current facility-administered medications for this visit.    Past Medical History:  Diagnosis Date   Bulging lumbar disc    History of mammogram 03/04/2015   BIRAD 1   History of Papanicolaou smear of cervix 02/09/2016   NIL/NEG   Hypertension    Pap smear abnormality of cervix/human papillomavirus (HPV) positive 02/05/2015   NIL Pap with positive HPV aptima.  HPV 16,18.45 neg   Screening for colon cancer 07/2018   neg Cologuard; repeat in 3 yrs    Past Surgical History:  Procedure Laterality Date   I & D EXTREMITY Left 05/04/2021   Procedure: KNEE ARTHROSCOPY WITH IRRIGATION AND DEBRIDEMENT;  Surgeon: Corky Mull, MD;  Location: [facility];  Service: Orthopedics;  Laterality: Left;   KNEE ARTHROSCOPY Left 06/07/2021   Procedure:  ARTHROSCOPIC IRRIGATION AND DEBRIDEMENT;  Surgeon: Corky Mull, MD;  Location: [facility];  Service: Orthopedics;  Laterality: Left;   KNEE ARTHROSCOPY Left 08/03/2021   Procedure: LEFT KNEE REPEAT IRRIGATION AND DEBRIDEMENT WITH BONE CULTURES. [place];  Surgeon: Corky Mull, MD;  Location: [facility];  Service: Orthopedics;  Laterality: Left;   KNEE ARTHROSCOPY Left 08/08/2021   Procedure: ARTHROSCOPY KNEE;  Surgeon: Corky Mull, MD;  Location: [facility];  Service: Orthopedics;  Laterality: Left;   PULMONARY THROMBECTOMY N/A 12/19/2021   Procedure: PULMONARY THROMBECTOMY;  Surgeon: Algernon Huxley, MD;  Location: [facility];  Service: Cardiovascular;  Laterality: N/A;     Social History   Tobacco Use    Smoking status: Never   Smokeless tobacco: Never  Vaping Use   Vaping Use: Never used  Substance Use Topics   Alcohol use: Yes    Alcohol/week: 0.0 standard drinks of alcohol    Comment: social   Drug use: No       Family History  Problem Relation Age of Onset   Hypertension Father    Diabetes Paternal Uncle    Diabetes Paternal Grandfather    Ovarian cancer Neg Hx    Breast cancer Neg Hx    Colon cancer Neg Hx      Allergies  Allergen Reactions   Penicillins Hives    Reaction : Hives during college TOLERATED CEFAZOLIN AND AMOXICILLIN     REVIEW OF SYSTEMS (Negative unless checked)  Constitutional: '[]'$ Weight loss  '[]'$ Fever  '[]'$ Chills Cardiac: '[]'$ Chest pain   '[]'$ Chest pressure   '[]'$ Palpitations   '[]'$ Shortness of breath when laying flat   '[]'$ Shortness of breath at rest   '[]'$ Shortness of breath with exertion. Vascular:  '[]'$ Pain in legs with walking   '[]'$ Pain in legs at rest   '[]'$ Pain in legs when laying flat   '[]'$ Claudication   '[]'$ Pain in feet when walking  '[]'$ Pain in feet at rest  '[]'$ Pain in feet when laying flat   '[x]'$ History of DVT   '[x]'$ Phlebitis   '[]'$ Swelling in legs   '[]'$ Varicose veins   '[]'$ Non-healing ulcers Pulmonary:   '[]'$ Uses home oxygen   '[]'$ Productive cough   '[]'$ Hemoptysis   '[]'$ Wheeze  '[]'$ COPD   '[]'$ Asthma Neurologic:  '[]'$ Dizziness  '[]'$ Blackouts   '[]'$ Seizures   '[]'$ History of stroke   '[]'$ History of TIA  '[]'$ Aphasia   '[]'$ Temporary blindness   '[]'$ Dysphagia   '[]'$ Weakness or numbness in arms   '[]'$ Weakness or numbness in legs Musculoskeletal:  '[]'$ Arthritis   '[]'$ Joint swelling   '[]'$ Joint pain   '[x]'$ Low back pain Hematologic:  '[]'$ Easy bruising  '[]'$ Easy bleeding   '[]'$ Hypercoagulable state   '[]'$ Anemic   Gastrointestinal:  '[]'$ Blood in stool   '[]'$ Vomiting blood  '[]'$ Gastroesophageal reflux/heartburn   '[]'$ Abdominal pain Genitourinary:  '[]'$ Chronic kidney disease   '[]'$ Difficult urination  '[]'$ Frequent urination  '[]'$ Burning with urination   '[]'$ Hematuria Skin:  '[]'$ Rashes   '[]'$ Ulcers   '[]'$ Wounds Psychological:  '[]'$ History of anxiety   '[]'$   History of major depression.  Physical Examination  BP 135/69 (BP Location: Right Arm)   Pulse 74   Resp 16   Wt 247 lb (112 kg)   LMP  (LMP Unknown)   BMI 42.40 kg/m  Gen:  WD/WN, NAD Head: [place]/AT, No temporalis wasting. Ear/Nose/Throat: Hearing grossly intact, nares w/o erythema or drainage Eyes: Conjunctiva clear. Sclera non-icteric Neck: Supple.  Trachea midline Pulmonary:  Good air movement, no use of accessory muscles.  Cardiac: RRR, no JVD Vascular:  Vessel Right Left  Radial Palpable  Palpable                          PT Palpable Palpable  DP Palpable Palpable   Gastrointestinal: soft, non-tender/non-distended. No guarding/reflex.  Musculoskeletal: M/S 5/5 throughout.  No deformity or atrophy. No edema. Neurologic: Sensation grossly intact in extremities.  Symmetrical.  Speech is fluent.  Psychiatric: Judgment intact, Mood & affect appropriate for pt's clinical situation. Dermatologic: No rashes or ulcers noted.  No cellulitis or open wounds.      Labs No results found for this or any previous visit (from the past 2160 hour(s)).  Radiology CT Angio Chest Pulmonary Embolism (PE) W or WO Contrast  Result Date: 04/11/2022 CLINICAL DATA:  Pulmonary embolism follow-up EXAM: CT ANGIOGRAPHY CHEST WITH CONTRAST TECHNIQUE: Multidetector CT imaging of the chest was performed using the standard protocol during bolus administration of intravenous contrast. Multiplanar CT image reconstructions and MIPs were obtained to evaluate the vascular anatomy. RADIATION DOSE REDUCTION: This exam was performed according to the departmental dose-optimization program which includes automated exposure control, adjustment of the mA and/or kV according to patient size and/or use of iterative reconstruction technique. CONTRAST:  20m OMNIPAQUE IOHEXOL 350 MG/ML SOLN COMPARISON:  Previous studies including the examination of 12/05/2021 FINDINGS: Cardiovascular: There is homogeneous enhancement in  thoracic aorta. There are no intraluminal filling defects in the pulmonary artery branches. There is interval resolution of filling defects seen in the left main pulmonary artery and right lower lobe pulmonary artery. There is no evidence of right heart strain. Mediastinum/Nodes: No significant lymphadenopathy seen. Lungs/Pleura: There is no focal pulmonary consolidation. Small linear densities in the lingula may suggest minimal subsegmental atelectasis or scarring. There is no pleural effusion or pneumothorax. Upper Abdomen: There is fatty infiltration in the liver. Musculoskeletal: No acute findings are seen. Review of the MIP images confirms the above findings. IMPRESSION: There is no evidence of pulmonary artery embolism. There is interval resolution of filling defects in the pulmonary artery branches in comparison with the study of 12/05/2021. There is no evidence of thoracic aortic dissection. No focal pulmonary infiltrates are seen. Fatty liver. Electronically Signed   By: PElmer PickerM.D.   On: 04/11/2022 09:10    Assessment/Plan  Pulmonary embolism ([place] Underwent thrombectomy about 4 months ago.  Has remained on anticoagulation.  I have independently reviewed her recent CT angiogram of the chest and no residual pulmonary embolus is seen at this time.  This is likely a combination of thrombectomy and continued anticoagulation.  She should remain on anticoagulation for a year I would consider at least a prophylactic dose of Eliquis going forward to reduce her thromboembolic risk in the future as well.  Pre-diabetes blood glucose control important in reducing the progression of atherosclerotic disease. Also, involved in wound healing.    Essential hypertension blood pressure control important in reducing the progression of atherosclerotic disease. On appropriate oral medications.   DVT (deep venous thrombosis) (HCC) Duplex today shows resolution of the lower extremity DVT as well  without evidence of current DVT.  She is tolerating anticoagulation well with both PE and DVT in a young woman, I would recommend a year of anticoagulation.  I think consideration for a prophylactic dose of Eliquis going forward would also be a reasonable thing.  I can see her back in about 6 months.    JLeotis Pain MD  04/19/2022 10:43 AM    This note was created with Dragon medical transcription system.  Any errors from dictation are purely unintentional

## 2022-04-19 DIAGNOSIS — I82409 Acute embolism and thrombosis of unspecified deep veins of unspecified lower extremity: Secondary | ICD-10-CM | POA: Insufficient documentation

## 2022-04-19 NOTE — Assessment & Plan Note (Signed)
blood glucose control important in reducing the progression of atherosclerotic disease. Also, involved in wound healing.   

## 2022-04-19 NOTE — Assessment & Plan Note (Signed)
blood pressure control important in reducing the progression of atherosclerotic disease. On appropriate oral medications.  

## 2022-04-19 NOTE — Assessment & Plan Note (Signed)
Underwent thrombectomy about 4 months ago.  Has remained on anticoagulation.  I have independently reviewed her recent CT angiogram of the chest and no residual pulmonary embolus is seen at this time.  This is likely a combination of thrombectomy and continued anticoagulation.  She should remain on anticoagulation for a year I would consider at least a prophylactic dose of Eliquis going forward to reduce her thromboembolic risk in the future as well.

## 2022-04-19 NOTE — Assessment & Plan Note (Signed)
Duplex today shows resolution of the lower extremity DVT as well without evidence of current DVT.  She is tolerating anticoagulation well with both PE and DVT in a young woman, I would recommend a year of anticoagulation.  I think consideration for a prophylactic dose of Eliquis going forward would also be a reasonable thing.  I can see her back in about 6 months.

## 2022-04-21 ENCOUNTER — Other Ambulatory Visit (HOSPITAL_COMMUNITY)
Admission: RE | Admit: 2022-04-21 | Discharge: 2022-04-21 | Disposition: A | Discharge status: Home / Self Care | Payer: BC Managed Care – PPO | Source: Ambulatory Visit | Attending: Family Medicine | Admitting: Family Medicine

## 2022-04-21 ENCOUNTER — Encounter: Payer: Self-pay | Admitting: Family Medicine

## 2022-04-21 ENCOUNTER — Ambulatory Visit: Payer: BC Managed Care – PPO | Admitting: Family Medicine

## 2022-04-21 VITALS — BP 142/78 | Ht 64.0 in | Wt 246.0 lb

## 2022-04-21 DIAGNOSIS — N950 Postmenopausal bleeding: Secondary | ICD-10-CM | POA: Insufficient documentation

## 2022-04-21 NOTE — Progress Notes (Signed)
GYNECOLOGY PROBLEM  VISIT ENCOUNTER NOTE  Subjective:   Krista Blake is a 56 y.o. G0P0000 female here for a problem GYN visit.  Current complaints: vaginal bleeding that started last night with passage of dark red blood and clots. Denies dizziness, palpitations.     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  Bleeding started last night and has been present. Reports clots passing. Last bleeding prior to this event was about 2 years ago. Report no person history of fibroids. She has never been pregnant.    Gynecologic History No LMP recorded (lmp unknown). Patient is postmenopausal.  Contraception: post menopausal status  Health Maintenance Due  Topic Date Due   Hepatitis C Screening  Never done   TETANUS/TDAP  Never done   Zoster Vaccines- Shingrix (1 of 2) Never done   COLONOSCOPY (Pts 45-75yr Insurance coverage will need to be confirmed)  Never done    The following portions of the patient's history were reviewed and updated as appropriate: allergies, current medications, past family history, past medical history, past social history, past surgical history and problem list.  Review of Systems Pertinent items are noted in HPI.   Objective:  BP (!) 142/78   Ht '5\' 4"'$  (1.626 m)   Wt 246 lb (111.6 kg)   LMP  (LMP Unknown)   BMI 42.23 kg/m  Gen: well appearing, NAD HEENT: no scleral icterus CV: RR Lung: Normal WOB Ext: warm well perfused  PELVIC: Normal appearing external genitalia; normal appearing vaginal mucosa and cervix.  No abnormal discharge noted.  Normal uterine size, no other palpable masses, no uterine or adnexal tenderness.    ENDOMETRIAL BIOPSY     The indications for endometrial biopsy were reviewed.   Risks of the biopsy including cramping, bleeding, infection, uterine perforation, inadequate specimen and need for additional procedures were discussed. Offered alternative of  hysteroscopy, dilation and curettage in OR. The patient states she understands the R/B/I/A and agrees to undergo procedure today. Urine pregnancy test was Not indicated. Consent was signed. Time out was performed.    Patient was positioned in dorsal lithotomy position. A vaginal speculum was placed.  The cervix was visualized and was prepped with Betadine.  A single-toothed tenaculum was placed on the anterior lip of the cervix to stabilize it. The 3 mm pipelle was easily introduced into the endometrial cavity without difficulty to a depth of 7 cm, and a Moderate amount of tissue was obtained after two passes and sent to pathology. There was dark red clots in sample as well. The instruments were removed from the patient's vagina. Minimal bleeding from the cervix was noted. The patient tolerated the procedure well.   Patient was given post procedure instructions.  Will follow up pathology and manage accordingly; patient will be contacted with results and recommendations.  Routine preventative health maintenance measures emphasized.  Assessment and Plan:  1. Postmenopausal bleeding Patient presenting with PMB. Discussed differential which includes but is not limited to normal variant, polyp, fibroid necrosis, endometrial hyperplasia and endometrial cancer.  Pap smear is up to date- last collected in 2022 and was NIL Reviewed need for endometrial tissue to help determine next steps Reviewed [place] help with other etiologies and determine thickness of endometrial lining Patient agreed to work up.  - Surgical pathology - [place] COMPLETE WITH TRANSVAGINAL; Future  Please refer to After Visit Summary for other counseling recommendations.   Return  in about 4 weeks (around 05/19/2022), or if symptoms worsen or fail to improve, for for PMB.  Caren Macadam, MD, MPH, ABFM Attending [place] for [facility]

## 2022-04-24 LAB — SURGICAL PATHOLOGY

## 2022-05-02 ENCOUNTER — Ambulatory Visit
Admission: RE | Admit: 2022-05-02 | Discharge: 2022-05-02 | Disposition: A | Discharge status: Home / Self Care | Payer: BC Managed Care – PPO | Source: Ambulatory Visit | Attending: Family Medicine | Admitting: Family Medicine

## 2022-05-02 DIAGNOSIS — N950 Postmenopausal bleeding: Secondary | ICD-10-CM | POA: Diagnosis present

## 2022-05-04 ENCOUNTER — Telehealth: Payer: Self-pay | Admitting: Family Medicine

## 2022-05-04 NOTE — Telephone Encounter (Signed)
Pt called in to inquire about the results from her [place].  She has seen them on MyChart, but needs help interpreting them.

## 2022-05-05 NOTE — Telephone Encounter (Signed)
PT called back again at 4:25 today wanting you to give her a call back to go over u/s result.

## 2022-06-15 ENCOUNTER — Ambulatory Visit (INDEPENDENT_AMBULATORY_CARE_PROVIDER_SITE_OTHER): Payer: BC Managed Care – PPO | Admitting: Pulmonary Disease

## 2022-06-15 ENCOUNTER — Encounter: Payer: Self-pay | Admitting: Pulmonary Disease

## 2022-06-15 VITALS — BP 118/90 | HR 77 | Temp 98.0°F | Ht 64.0 in | Wt 247.4 lb

## 2022-06-15 DIAGNOSIS — R0602 Shortness of breath: Secondary | ICD-10-CM

## 2022-06-15 DIAGNOSIS — I2699 Other pulmonary embolism without acute cor pulmonale: Secondary | ICD-10-CM | POA: Diagnosis not present

## 2022-06-15 NOTE — Patient Instructions (Signed)
Continue Eliquis for now.  I think this should be continued until we get your issues with the knee settled.   We will see you in follow-up in 3 months time call sooner should any new problems arise.

## 2022-06-15 NOTE — Progress Notes (Signed)
Subjective:    Patient ID: Krista Blake, female    DOB: 31-Mar-1966, 56 y.o.   MRN: [id number] Patient Care Team: Idelle Crouch, MD as PCP - General (Internal Medicine)  Chief Complaint  Patient presents with   Follow-up   HPI The patient is a 56 year old lifelong never smoker who presents for follow-up of shortness of breath in the setting of prior submassive PE with multiple pulmonary infarctions January 2023.  She was initially evaluated on 06 April 2022, this is a follow-up visit.  Patient's issues began after LEFT meniscus surgery in July 2022 after that she developed methicillin-sensitive staph infection in the knee and that led to multiple surgical procedures for debridement and prolonged antibiotic therapy.  She did not receive DVT prophylaxis during this time.  In January 2023 she developed left lower extremity edema and shortness of breath.  She had a CT angio performed which showed submassive PE with large burden of embolus on the left and right pulmonary arteries as well as the lobar and segmental arteries bilaterally.  There was global cardiomegaly and multiple bilateral subpleural groundglass airspace opacities consistent with multifocal pulmonary infarction.  Patient was admitted at that time.  She was treated with anticoagulants.  She was presumed to have no right heart strain by CT alone however, a 2D echo was not done at that time.  She was discharged and initially did well but on 13 February presented to the emergency room again with pleuritic chest pain discharged from the ED with continuation of her medications.  CTA performed at that time showed some resolution of the previously noted PE but persistent thrombus present.  Subsequently she was seen by vascular surgery who performed thrombectomy since then she has been she has been feeling better overall.  During her June visit she indicated that her dyspnea persisted however this has now completely resolved.  She has had  cardiac evaluation with a 2D echo that was essentially normal.  This was done through [facility]  She developed some issues with edema that responded to diuretics and currently does not require diuretics any longer.  She does have persistent waxing and waning edema of the left lower extremity but this is also improving.  He may require more interventions on her left knee but for now no interventions are contemplated.   She had a CT angio chest on 20 June to follow-up on PE.  This showed complete resolution of the pulmonary artery embolisms.  There were no parenchymal abnormalities noted in the lungs.  She does have hepatic steatosis.   She does not endorse any other symptomatology today.  Overall she feels well and looks well.     Review of Systems A 10 point review of systems was performed and it is as noted above otherwise negative.  Patient Active Problem List   Diagnosis Date Noted   DVT (deep venous thrombosis) ([place]) 04/19/2022   Pulmonary embolism ([place]) 11/13/2021   Obesity (BMI 30-39.9) 11/13/2021   Hypokalemia 11/13/2021   Septic arthritis of knee, left ([place]) 06/07/2021   Pre-diabetes 11/09/2020   Elevated blood pressure reading without diagnosis of hypertension 08/05/2019   Essential hypertension 11/04/2014   Social History   Tobacco Use   Smoking status: Never   Smokeless tobacco: Never  Substance Use Topics   Alcohol use: Yes    Alcohol/week: 0.0 standard drinks of alcohol    Comment: social   Allergies  Allergen Reactions   Penicillins Hives    Reaction :  Hives during college TOLERATED CEFAZOLIN AND AMOXICILLIN   Current Meds  Medication Sig   acetaminophen (TYLENOL) 650 MG CR tablet Take 1,300 mg by mouth every 8 (eight) hours as needed for pain.   allopurinol (ZYLOPRIM) 100 MG tablet Take 100 mg by mouth daily.   apixaban (ELIQUIS) 5 MG TABS tablet Take 1 tablet (5 mg total) by mouth 2 (two) times daily. Start after you finish starter pack   calcium  gluconate 500 MG tablet Take 1 tablet by mouth daily.   Cholecalciferol (VITAMIN D3) 1.25 MG (50000 UT) CAPS Take 1 capsule by mouth daily.   furosemide (LASIX) 20 MG tablet Take 20 mg by mouth daily.   hydrochlorothiazide (HYDRODIURIL) 25 MG tablet Take 25 mg by mouth daily.   Magnesium 250 MG TABS Take 250 mg by mouth daily.   propranolol (INDERAL) 40 MG tablet Take 20 mg by mouth daily.    There is no immunization history on file for this patient.     Objective:   Physical Exam BP (!) 118/90 (BP Location: Right Arm, Patient Position: Sitting, Cuff Size: Large)   Pulse 77   Temp 98 F (36.7 C) (Oral)   Ht '5\' 4"'$  (1.626 m)   Wt 247 lb 6.4 oz (112.2 kg)   LMP  (LMP Unknown)   SpO2 95%   BMI 42.47 kg/m  GENERAL: Obese woman, no acute distress.  Fully ambulatory, conversational dyspnea HEAD: Normocephalic, atraumatic.  EYES: Pupils equal, round, reactive to light.  No scleral icterus.  MOUTH: Oral mucosa moist.  No thrush. NECK: Supple. No thyromegaly. Trachea midline. No JVD.  No adenopathy. PULMONARY: Good air entry bilaterally.  No adventitious sounds. CARDIOVASCULAR: S1 and S2. Regular rate and rhythm.  No rubs, murmurs or gallops heard. ABDOMEN: Obese otherwise benign. MUSCULOSKELETAL: No joint deformity, no clubbing, trace edema of the left lower extremity with some stasis changes. NEUROLOGIC: No overt focal deficit, no gait disturbance, speech is fluent. SKIN: Intact,warm,dry. PSYCH: Mood and behavior normal.      Assessment & Plan:     ICD-10-CM   1. Pulmonary embolism and infarction (HCC)  I26.99    Resolved after thrombectomy and with anticoagulation Continue anticoagulation for now    2. Shortness of breath  R06.02    This has resolved after pulmonary thrombectomy Also resolved with anticoagulation    3. Severe obesity (BMI >= 40) (HCC)  E66.01    Weight loss is recommended     We will see the patient in follow-up in 3 months time.  For now I recommend  that she continue Eliquis given that there is still issues with her left knee and she is not up to activities as prior.  We will see her in follow-up in 3 months time she is to contact us prior to that time should any new difficulties arise.  Renold Don, MD [facility]    *This note was dictated using voice recognition software/Dragon.  Despite best efforts to proofread, errors can occur which can change the meaning. Any transcriptional errors that result from this process are unintentional and may not be fully corrected at the time of dictation.

## 2022-08-21 ENCOUNTER — Encounter (INDEPENDENT_AMBULATORY_CARE_PROVIDER_SITE_OTHER): Payer: Self-pay

## 2022-09-21 ENCOUNTER — Encounter: Payer: Self-pay | Admitting: Pulmonary Disease

## 2022-09-21 ENCOUNTER — Ambulatory Visit (INDEPENDENT_AMBULATORY_CARE_PROVIDER_SITE_OTHER): Payer: BC Managed Care – PPO | Admitting: Pulmonary Disease

## 2022-09-21 VITALS — BP 124/80 | HR 88 | Temp 97.7°F | Ht 64.0 in | Wt 237.2 lb

## 2022-09-21 DIAGNOSIS — R0602 Shortness of breath: Secondary | ICD-10-CM | POA: Diagnosis not present

## 2022-09-21 DIAGNOSIS — I2699 Other pulmonary embolism without acute cor pulmonale: Secondary | ICD-10-CM

## 2022-09-21 NOTE — Progress Notes (Signed)
Subjective:    Patient ID: Krista Blake, female    DOB: [birth year], 56 y.o.   MRN: [id number] Patient Care Team: Idelle Crouch, MD as PCP - General (Internal Medicine) Lucky Cowboy Erskine Squibb, MD as Referring Physician (Vascular Surgery) Tyler Pita, MD as Consulting Physician (Pulmonary Disease)  Chief Complaint  Patient presents with   Follow-up    SOB with exertion. No wheezing or cough.   HPI The patient is a 56 year old lifelong never smoker who presents for follow-up of shortness of breath in the setting of prior submassive PE with multiple pulmonary infarctions January 2023.  She was last seen on 15 June 2022, this is a follow-up visit.  Patient's issues began after LEFT meniscus surgery in July 2022 after that she developed methicillin-sensitive staph infection in the knee and that led to multiple surgical procedures for debridement and prolonged antibiotic therapy.  She did not receive DVT prophylaxis during this time.  In January 2023 she developed left lower extremity edema and shortness of breath.  She had a CT angio performed which showed submassive PE with large burden of embolus on the left and right pulmonary arteries as well as the lobar and segmental arteries bilaterally.  There was global cardiomegaly and multiple bilateral subpleural groundglass airspace opacities consistent with multifocal pulmonary infarction.  Patient was admitted at that time.  She was treated with anticoagulants.  She was presumed to have no right heart strain by CT alone however, a 2D echo was not done at that time.  She was discharged and initially did well but on 13 February presented to the emergency room again with pleuritic chest pain discharged from the ED with continuation of her medications.  CTA performed at that time showed some resolution of the previously noted PE but persistent thrombus present.  Subsequently she was seen by vascular surgery who performed thrombectomy since then she  has been she has been feeling better overall.  During her June visit she indicated that her dyspnea persisted however this has now completely resolved.  He does endorse however sweating profusely when she ambulates but overall does not feel short of breath.  She has had cardiac evaluation with a 2D echo that was essentially normal.  This was done through [facility].  She developed some issues with edema that responded to diuretics and currently does not require diuretics any longer.  She does have persistent waxing and waning edema of the left lower extremity but this is also improving.  I suspect that this is a postphlebitic syndrome.    She had a CT angio chest on 11 April 2022 to follow-up on PE.  This showed complete resolution of the pulmonary artery embolisms.  There were no parenchymal abnormalities noted in the lungs.  She does have hepatic steatosis.   She does not endorse any other symptomatology today other than the aforementioned profuse diaphoresis when ambulating without chest pain or shortness of breath.  Overall she feels well and looks well.  Will follow-up with Dr. Lucky Cowboy in January.  Review of Systems A 10 point review of systems was performed and it is as noted above otherwise negative.  Patient Active Problem List   Diagnosis Date Noted   DVT (deep venous thrombosis) ([place]) 04/19/2022   Pulmonary embolism ([place]) 11/13/2021   Obesity (BMI 30-39.9) 11/13/2021   Hypokalemia 11/13/2021   Septic arthritis of knee, left ([place]) 06/07/2021   Pre-diabetes 11/09/2020   Elevated blood pressure reading without diagnosis of hypertension 08/05/2019  Essential hypertension 11/04/2014   .    Objective:   Physical Exam BP 124/80 (BP Location: Left Wrist, Patient Position: Sitting, Cuff Size: Normal)   Pulse 88   Temp 97.7 F (36.5 C)   Ht '5\' 4"'$  (1.626 m)   Wt 237 lb 3.2 oz (107.6 kg)   LMP  (LMP Unknown)   SpO2 97%   BMI 40.72 kg/m  GENERAL: Obese woman, no acute distress.  Fully  ambulatory, conversational dyspnea HEAD: Normocephalic, atraumatic.  EYES: Pupils equal, round, reactive to light.  No scleral icterus.  MOUTH: Oral mucosa moist.  No thrush. NECK: Supple. No thyromegaly. Trachea midline. No JVD.  No adenopathy. PULMONARY: Good air entry bilaterally.  No adventitious sounds. CARDIOVASCULAR: S1 and S2. Regular rate and rhythm.  No rubs, murmurs or gallops heard. ABDOMEN: Obese otherwise benign. MUSCULOSKELETAL: No joint deformity, no clubbing, trace edema of the left lower extremity with some stasis changes. NEUROLOGIC: No overt focal deficit, no gait disturbance, speech is fluent. SKIN: Intact,warm,dry. PSYCH: Mood and behavior normal.   Ambulatory oximetry was performed today: Patient had no oxygen desaturations during ambulation.  Heart rate peaked at 111 bpm.  She was able to ambulate 750 feet without dyspnea.    Assessment & Plan:     ICD-10-CM   1. Pulmonary embolism and infarction [facility])  I26.99    Submassive PE January 2023 Will need therapeutic dose Eliquis until January 2024 Recommend continued prophylactic dose after that    2. Shortness of breath  R06.02    For the most part has resolved Ambulatory examination today did not show oxygen desaturation Patient may have element of deconditioning    3. Severe obesity (BMI >= 40) (HCC)  E66.01    Weight loss recommended She has been noted to be "pre diabetic" To have primary care evaluation within the next few weeks     The patient is concerned with diaphoresis during exercise could not be reproduced.  Suspect this may be due to an element of deconditioning.  Additionally query whether the patient may have diabetes as part of her symptom complex.  To be evaluated by her primary care physician in the upcoming weeks.  Will defer to primary care.  See the patient in follow-up in 3 months time she is to contact us prior to that time for any new difficulties arise.  Renold Don,  MD Advanced Bronchoscopy PCCM [place]    *This note was dictated using voice recognition software/Dragon.  Despite best efforts to proofread, errors can occur which can change the meaning. Any transcriptional errors that result from this process are unintentional and may not be fully corrected at the time of dictation.

## 2022-09-21 NOTE — Patient Instructions (Signed)
Your oxygen level did very well today.  We will see you back in follow-up in 3 months time call sooner should any new problems arise.

## 2022-10-24 ENCOUNTER — Ambulatory Visit (INDEPENDENT_AMBULATORY_CARE_PROVIDER_SITE_OTHER): Payer: BC Managed Care – PPO | Admitting: Vascular Surgery

## 2022-10-24 ENCOUNTER — Encounter (INDEPENDENT_AMBULATORY_CARE_PROVIDER_SITE_OTHER): Payer: Self-pay | Admitting: Vascular Surgery

## 2022-10-24 VITALS — BP 114/73 | HR 79 | Resp 17 | Ht 64.0 in | Wt 232.0 lb

## 2022-10-24 DIAGNOSIS — I2699 Other pulmonary embolism without acute cor pulmonale: Secondary | ICD-10-CM | POA: Diagnosis not present

## 2022-10-24 DIAGNOSIS — I10 Essential (primary) hypertension: Secondary | ICD-10-CM | POA: Diagnosis not present

## 2022-10-24 DIAGNOSIS — I82432 Acute embolism and thrombosis of left popliteal vein: Secondary | ICD-10-CM | POA: Diagnosis not present

## 2022-10-24 DIAGNOSIS — R7303 Prediabetes: Secondary | ICD-10-CM | POA: Diagnosis not present

## 2022-10-24 MED ORDER — APIXABAN 2.5 MG PO TABS: 2.5000 mg | ORAL_TABLET | Freq: Two times a day (BID) | ORAL | 11 refills | Status: DC

## 2022-10-24 NOTE — Progress Notes (Signed)
MRN : [id number]  Krista Blake is a 57 y.o. (Mar 07, 1966) female who presents with chief complaint of  Chief Complaint  Patient presents with   Follow-up    6 months  .  History of Present Illness: Patient returns today in follow up of her pulmonary embolus.  We are coming up on a year after pulmonary thrombectomy and treatment with anticoagulation.  She still has some short windedness and durability issues but overall seems to be doing reasonably well.  Legs are doing fine without significant swelling.  Has knee pain on the left which is chronic and stable.  No bleeding issues on anticoagulation.  Has tolerated 5 mg twice daily and Eliqus which has been her treatment regimen.  Current Outpatient Medications  Medication Sig Dispense Refill   acetaminophen (TYLENOL) 650 MG CR tablet Take 1,300 mg by mouth every 8 (eight) hours as needed for pain.     allopurinol (ZYLOPRIM) 100 MG tablet Take 100 mg by mouth daily.     apixaban (ELIQUIS) 2.5 MG TABS tablet Take 1 tablet (2.5 mg total) by mouth 2 (two) times daily. 60 tablet 11   calcium gluconate 500 MG tablet Take 1 tablet by mouth daily.     Cholecalciferol (VITAMIN D3) 1.25 MG (50000 UT) CAPS Take 1 capsule by mouth daily.     furosemide (LASIX) 20 MG tablet Take 20 mg by mouth daily.     hydrochlorothiazide (HYDRODIURIL) 25 MG tablet Take 25 mg by mouth daily.     Magnesium 250 MG TABS Take 250 mg by mouth daily.     MOUNJARO 5 MG/0.5ML Pen Inject 5 mg into the skin once a week.     propranolol (INDERAL) 40 MG tablet Take 20 mg by mouth daily.     ADVAIR DISKUS 100-50 MCG/ACT AEPB SMARTSIG:1 Inhalation Via Inhaler Every 12 Hours (Patient not taking: Reported on 09/21/2022)     HYDROcodone-acetaminophen (NORCO/VICODIN) 5-325 MG tablet Take 1-2 tablets by mouth every 4 (four) hours as needed for moderate pain or severe pain. (Patient not taking: Reported on 04/06/2022) 60 tablet 0   methocarbamol (ROBAXIN) 750 MG tablet Take 1  tablet (750 mg total) by mouth every 8 (eight) hours as needed for muscle spasms. (Patient not taking: Reported on 04/06/2022) 30 tablet 0   PARoxetine (PAXIL) 20 MG tablet Take 20 mg by mouth daily.     rosuvastatin (CRESTOR) 10 MG tablet Take 10 mg by mouth daily.     No current facility-administered medications for this visit.    Past Medical History:  Diagnosis Date   Bulging lumbar disc    History of mammogram 03/04/2015   BIRAD 1   History of Papanicolaou smear of cervix 02/09/2016   NIL/NEG   Hypertension    Pap smear abnormality of cervix/human papillomavirus (HPV) positive 02/05/2015   NIL Pap with positive HPV aptima.  HPV 16,18.45 neg   Screening for colon cancer 07/2018   neg Cologuard; repeat in 3 yrs    Past Surgical History:  Procedure Laterality Date   I & D EXTREMITY Left 05/04/2021   Procedure: KNEE ARTHROSCOPY WITH IRRIGATION AND DEBRIDEMENT;  Surgeon: Corky Mull, MD;  Location: [facility] ORS;  Service: Orthopedics;  Laterality: Left;   KNEE ARTHROSCOPY Left 06/07/2021   Procedure: ARTHROSCOPIC IRRIGATION AND DEBRIDEMENT;  Surgeon: Corky Mull, MD;  Location: [facility] OR[facility];  Service: Orthopedics;  Laterality: Left;   KNEE ARTHROSCOPY Left 08/03/2021   Procedure: LEFT KNEE REPEAT IRRIGATION AND DEBRIDEMENT  WITH BONE CULTURES. [place];  Surgeon: Corky Mull, MD;  Location: [facility] OR[facility];  Service: Orthopedics;  Laterality: Left;   KNEE ARTHROSCOPY Left 08/08/2021   Procedure: ARTHROSCOPY KNEE;  Surgeon: Corky Mull, MD;  Location: [facility];  Service: Orthopedics;  Laterality: Left;   PULMONARY THROMBECTOMY N/A 12/19/2021   Procedure: PULMONARY THROMBECTOMY;  Surgeon: Algernon Huxley, MD;  Location: [place] [facility];  Service: Cardiovascular;  Laterality: N/A;     Social History   Tobacco Use   Smoking status: Never   Smokeless tobacco: Never  Vaping Use   Vaping Use: Never used  Substance Use Topics   Alcohol use: Yes    Alcohol/week: 0.0 standard drinks of  alcohol    Comment: social   Drug use: No      Family History  Problem Relation Age of Onset   Hypertension Father    Diabetes Paternal Uncle    Diabetes Paternal Grandfather    Ovarian cancer Neg Hx    Breast cancer Neg Hx    Colon cancer Neg Hx      Allergies  Allergen Reactions   Penicillins Hives    Reaction : Hives during college TOLERATED CEFAZOLIN AND AMOXICILLIN    REVIEW OF SYSTEMS (Negative unless checked)   Constitutional: '[]'$ Weight loss  '[]'$ Fever  '[]'$ Chills Cardiac: '[]'$ Chest pain   '[]'$ Chest pressure   '[]'$ Palpitations   '[]'$ Shortness of breath when laying flat   '[]'$ Shortness of breath at rest   '[]'$ Shortness of breath with exertion. Vascular:  '[]'$ Pain in legs with walking   '[]'$ Pain in legs at rest   '[]'$ Pain in legs when laying flat   '[]'$ Claudication   '[]'$ Pain in feet when walking  '[]'$ Pain in feet at rest  '[]'$ Pain in feet when laying flat   '[x]'$ History of DVT   '[x]'$ Phlebitis   '[]'$ Swelling in legs   '[]'$ Varicose veins   '[]'$ Non-healing ulcers Pulmonary:   '[]'$ Uses home oxygen   '[]'$ Productive cough   '[]'$ Hemoptysis   '[]'$ Wheeze  '[]'$ COPD   '[]'$ Asthma Neurologic:  '[]'$ Dizziness  '[]'$ Blackouts   '[]'$ Seizures   '[]'$ History of stroke   '[]'$ History of TIA  '[]'$ Aphasia   '[]'$ Temporary blindness   '[]'$ Dysphagia   '[]'$ Weakness or numbness in arms   '[]'$ Weakness or numbness in legs Musculoskeletal:  '[]'$ Arthritis   '[]'$ Joint swelling   '[]'$ Joint pain   '[x]'$ Low back pain Hematologic:  '[]'$ Easy bruising  '[]'$ Easy bleeding   '[]'$ Hypercoagulable state   '[]'$ Anemic   Gastrointestinal:  '[]'$ Blood in stool   '[]'$ Vomiting blood  '[]'$ Gastroesophageal reflux/heartburn   '[]'$ Abdominal pain Genitourinary:  '[]'$ Chronic kidney disease   '[]'$ Difficult urination  '[]'$ Frequent urination  '[]'$ Burning with urination   '[]'$ Hematuria Skin:  '[]'$ Rashes   '[]'$ Ulcers   '[]'$ Wounds Psychological:  '[]'$ History of anxiety   '[]'$  History of major depression.  Physical Examination  BP 114/73 (BP Location: Left Arm)   Pulse 79   Resp 17   Ht '5\' 4"'$  (1.626 m)   Wt 232 lb (105.2 kg)   LMP  (LMP  Unknown)   BMI 39.82 kg/m  Gen:  WD/WN, NAD Head: [place]/AT, No temporalis wasting. Ear/Nose/Throat: Hearing grossly intact, nares w/o erythema or drainage Eyes: Conjunctiva clear. Sclera non-icteric Neck: Supple.  Trachea midline Pulmonary:  Good air movement, no use of accessory muscles.  Cardiac: RRR, no JVD Vascular:  Vessel Right Left  Radial Palpable Palpable                          PT Palpable Palpable  DP Palpable Palpable   Gastrointestinal: soft, non-tender/non-distended. No guarding/reflex.  Musculoskeletal: M/S 5/5 throughout.  No deformity or atrophy.  No appreciable lower extremity edema. Neurologic: Sensation grossly intact in extremities.  Symmetrical.  Speech is fluent.  Psychiatric: Judgment intact, Mood & affect appropriate for pt's clinical situation. Dermatologic: No rashes or ulcers noted.  No cellulitis or open wounds.    Labs No results found for this or any previous visit (from the past 2160 hour(s)).  Radiology No results found.  Assessment/Plan Pre-diabetes blood glucose control important in reducing the progression of atherosclerotic disease. Also, involved in wound healing.      Essential hypertension blood pressure control important in reducing the progression of atherosclerotic disease. On appropriate oral medications.  Pulmonary embolism ([place]) As we are coming up on 1 year of anticoagulation, it is appropriate at this time to reduce the dose to 2.5 mg twice daily.  I would do that indefinitely.  I have told her that if her primary care physician is willing to prescribe this ongoing, she can see me as needed.    Leotis Pain, MD  10/24/2022 4:45 PM    This note was created with Dragon medical transcription system.  Any errors from dictation are purely unintentional

## 2022-10-24 NOTE — Assessment & Plan Note (Signed)
As we are coming up on 1 year of anticoagulation, it is appropriate at this time to reduce the dose to 2.5 mg twice daily.  I would do that indefinitely.  I have told her that if her primary care physician is willing to prescribe this ongoing, she can see me as needed.

## 2022-11-13 ENCOUNTER — Ambulatory Visit
Admission: RE | Admit: 2022-11-13 | Discharge: 2022-11-13 | Disposition: A | Discharge status: Home / Self Care | Payer: BC Managed Care – PPO | Source: Ambulatory Visit | Attending: Student | Admitting: Student

## 2022-11-13 ENCOUNTER — Other Ambulatory Visit: Payer: Self-pay | Admitting: Student

## 2022-11-13 DIAGNOSIS — M7989 Other specified soft tissue disorders: Secondary | ICD-10-CM

## 2022-11-14 ENCOUNTER — Other Ambulatory Visit
Admission: RE | Admit: 2022-11-14 | Discharge: 2022-11-14 | Disposition: A | Discharge status: Home / Self Care | Payer: BC Managed Care – PPO | Source: Ambulatory Visit | Attending: Student | Admitting: Student

## 2022-11-14 DIAGNOSIS — M25561 Pain in right knee: Secondary | ICD-10-CM | POA: Insufficient documentation

## 2022-11-14 LAB — SYNOVIAL CELL COUNT + DIFF, W/ CRYSTALS
Crystals, Fluid: NONE SEEN
Eosinophils-Synovial: 0 %
Lymphocytes-Synovial Fld: 4 %
Monocyte-Macrophage-Synovial Fluid: 60 %
Neutrophil, Synovial: 36 %
WBC, Synovial: 406 /mm3 — ABNORMAL HIGH (ref 0–200)

## 2023-05-07 ENCOUNTER — Other Ambulatory Visit
Admission: RE | Admit: 2023-05-07 | Discharge: 2023-05-07 | Disposition: A | Discharge status: Home / Self Care | Payer: BC Managed Care – PPO | Source: Ambulatory Visit | Attending: Family Medicine | Admitting: Family Medicine

## 2023-05-07 DIAGNOSIS — M25472 Effusion, left ankle: Secondary | ICD-10-CM | POA: Diagnosis not present

## 2023-05-07 DIAGNOSIS — R600 Localized edema: Secondary | ICD-10-CM | POA: Diagnosis present

## 2023-05-07 DIAGNOSIS — M25471 Effusion, right ankle: Secondary | ICD-10-CM | POA: Insufficient documentation

## 2023-05-07 LAB — BRAIN NATRIURETIC PEPTIDE: B Natriuretic Peptide: 11.2 pg/mL (ref 0.0–100.0)

## 2023-05-09 ENCOUNTER — Other Ambulatory Visit
Admission: RE | Admit: 2023-05-09 | Discharge: 2023-05-09 | Disposition: A | Discharge status: Home / Self Care | Payer: BC Managed Care – PPO | Source: Ambulatory Visit | Attending: Physician Assistant | Admitting: Physician Assistant

## 2023-05-09 DIAGNOSIS — M7989 Other specified soft tissue disorders: Secondary | ICD-10-CM | POA: Insufficient documentation

## 2023-05-09 LAB — D-DIMER, QUANTITATIVE: D-Dimer, Quant: <0.27 ug/mL-FEU (ref 0.00–0.50)

## 2023-06-07 ENCOUNTER — Other Ambulatory Visit: Payer: Self-pay | Admitting: Internal Medicine

## 2023-06-07 DIAGNOSIS — Z1231 Encounter for screening mammogram for malignant neoplasm of breast: Secondary | ICD-10-CM

## 2023-06-28 ENCOUNTER — Ambulatory Visit: Payer: BC Managed Care – PPO | Admitting: Pulmonary Disease

## 2023-06-28 ENCOUNTER — Encounter: Payer: Self-pay | Admitting: Pulmonary Disease

## 2023-06-28 ENCOUNTER — Ambulatory Visit
Admission: RE | Admit: 2023-06-28 | Discharge: 2023-06-28 | Disposition: A | Discharge status: Home / Self Care | Payer: BC Managed Care – PPO | Source: Ambulatory Visit | Attending: Internal Medicine | Admitting: Internal Medicine

## 2023-06-28 VITALS — BP 110/78 | HR 89 | Temp 98.0°F | Ht 64.0 in | Wt 227.2 lb

## 2023-06-28 DIAGNOSIS — E669 Obesity, unspecified: Secondary | ICD-10-CM

## 2023-06-28 DIAGNOSIS — I2699 Other pulmonary embolism without acute cor pulmonale: Secondary | ICD-10-CM | POA: Diagnosis not present

## 2023-06-28 DIAGNOSIS — I87009 Postthrombotic syndrome without complications of unspecified extremity: Secondary | ICD-10-CM

## 2023-06-28 DIAGNOSIS — R0602 Shortness of breath: Secondary | ICD-10-CM | POA: Diagnosis not present

## 2023-06-28 DIAGNOSIS — Z1231 Encounter for screening mammogram for malignant neoplasm of breast: Secondary | ICD-10-CM | POA: Insufficient documentation

## 2023-06-28 NOTE — Progress Notes (Signed)
Subjective:    Patient ID: Krista Blake, female    DOB: 03-Dec-1965, 57 y.o.   MRN: [id number]  Patient Care Team: Marguarite Arbour, MD as PCP - General (Internal Medicine) Wyn Quaker Marlow Baars, MD as Referring Physician (Vascular Surgery) Salena Saner, MD as Consulting Physician (Pulmonary Disease)  Chief Complaint  Patient presents with   Follow-up    No SOB, wheezing or cough.     HPI Krista Blake is a 57 year old lifelong never smoker who presents for follow-up of shortness of breath in the setting of prior submassive PE with multiple pulmonary infarctions January 2023.  She was last seen on 21 September 2022, this is a follow-up visit.  Recall that the patient's issues began after LEFT meniscus surgery in July 2022 after that she developed methicillin-sensitive staph infection in the knee and that led to multiple surgical procedures for debridement and prolonged antibiotic therapy.  She did not receive DVT prophylaxis during this time.  In January 2023 she developed left lower extremity edema and shortness of breath.  She had a CT angio performed which showed submassive PE with large burden of embolus on the left and right pulmonary arteries as well as the lobar and segmental arteries bilaterally.  There was global cardiomegaly and multiple bilateral subpleural groundglass airspace opacities consistent with multifocal pulmonary infarction.  Patient was admitted at that time.  She was treated with anticoagulants.  She was presumed to have no right heart strain by CT alone however, a 2D echo was not done at that time.  She was discharged and initially did well but on 13 February presented to the emergency room again with pleuritic chest pain discharged from the ED with continuation of her medications.  CTA performed at that time showed some resolution of the previously noted PE but persistent thrombus present.  Subsequently she was seen by vascular surgery who performed thrombectomy  since then she has been she has been feeling better overall.  During her June visit she indicated that her dyspnea persisted however this has now completely resolved. She has had cardiac evaluation with a 2D echo that was essentially normal.  This was done through [facility].  She developed some issues with edema that responded to diuretics and currently does not require diuretics any longer.  She does have persistent waxing and waning edema of the left lower extremity but this is also improving.  She did however have a flare of this approximately 2 weeks ago but it resolved with conservative measures.  I suspect that this is a postphlebitic syndrome.    She had a CT angio chest on 11 April 2022 to follow-up on PE.  This showed complete resolution of the pulmonary artery embolisms.  There were no parenchymal abnormalities noted in the lungs.  She does have hepatic steatosis.  Has been maintained on prophylactic dose Eliquis given her high risk for recurrence of her thrombotic issues.  Dr. Wyn Quaker, vascular surgery, also concurs with prophylactic Eliquis.   She does not endorse any other symptomatology today.  Her shortness of breath has completely resolved.  She has not had any cough, sputum production nor hemoptysis.  No chest pain.  No calf tenderness.  Overall she feels well and looks well.     Review of Systems A 10 point review of systems was performed and it is as noted above otherwise negative.   Patient Active Problem List   Diagnosis Date Noted   DVT (deep venous thrombosis) (HCC) 04/19/2022  Pulmonary embolism (HCC) 11/13/2021   Obesity (BMI 30-39.9) 11/13/2021   Hypokalemia 11/13/2021   Septic arthritis of knee, left (HCC) 06/07/2021   Pre-diabetes 11/09/2020   Elevated blood pressure reading without diagnosis of hypertension 08/05/2019   Essential hypertension 11/04/2014    Social History   Tobacco Use   Smoking status: Never   Smokeless tobacco: Never  Substance Use Topics    Alcohol use: Yes    Alcohol/week: 0.0 standard drinks of alcohol    Comment: social    Allergies  Allergen Reactions   Penicillins Hives    Reaction : Hives during college TOLERATED CEFAZOLIN AND AMOXICILLIN    Current Meds  Medication Sig   acetaminophen (TYLENOL) 650 MG CR tablet Take 1,300 mg by mouth every 8 (eight) hours as needed for pain.   allopurinol (ZYLOPRIM) 100 MG tablet Take 100 mg by mouth daily.   apixaban (ELIQUIS) 2.5 MG TABS tablet Take 1 tablet (2.5 mg total) by mouth 2 (two) times daily.   calcium gluconate 500 MG tablet Take 1 tablet by mouth daily.   Cholecalciferol (VITAMIN D3) 1.25 MG (50000 UT) CAPS Take 1 capsule by mouth daily.   furosemide (LASIX) 20 MG tablet Take 20 mg by mouth daily.   hydrochlorothiazide (HYDRODIURIL) 25 MG tablet Take 25 mg by mouth daily.   Magnesium 250 MG TABS Take 250 mg by mouth daily.   MOUNJARO 5 MG/0.5ML Pen Inject 5 mg into the skin once a week.   propranolol (INDERAL) 40 MG tablet Take 20 mg by mouth daily.   There is no immunization history on file for this patient.      Objective:     BP 110/78 (BP Location: Right Arm, Cuff Size: Large)   Pulse 89   Temp 98 F (36.7 C)   Ht 5\' 4"  (1.626 m)   Wt 227 lb 3.2 oz (103.1 kg)   LMP  (LMP Unknown)   SpO2 96%   BMI 39.00 kg/m   SpO2: 96 % O2 Device: None (Room air)  GENERAL: Obese woman, no acute distress.  Fully ambulatory, conversational dyspnea HEAD: Normocephalic, atraumatic.  EYES: Pupils equal, round, reactive to light.  No scleral icterus.  MOUTH: Oral mucosa moist.  No thrush. NECK: Supple. No thyromegaly. Trachea midline. No JVD.  No adenopathy. PULMONARY: Good air entry bilaterally.  No adventitious sounds. CARDIOVASCULAR: S1 and S2. Regular rate and rhythm.  No rubs, murmurs or gallops heard. ABDOMEN: Obese otherwise benign. MUSCULOSKELETAL: No joint deformity, no clubbing, trace edema of the left lower extremity with some stasis  changes. NEUROLOGIC: No overt focal deficit, no gait disturbance, speech is fluent. SKIN: Intact,warm,dry. PSYCH: Mood and behavior normal.      Assessment & Plan:     ICD-10-CM   1. Pulmonary embolism and infarction [facility])  I26.99    January 2023 Had right heart strain Require thrombectomy Continue prophylactic Eliquis Resolved on follow-up CT    2. Shortness of breath  R06.02    RESOLVED    3. Obesity, Class II, BMI 35-39.9, isolated  E66.9    She would benefit from weight loss    4. Post-phlebitic syndrome  I87.009    Occasional flare of edema Lower extremities left Response to conservative measures      The patient will continue on Eliquis for prophylaxis.  We will see her in follow-up in a years time she is to contact us prior to that time should any new difficulties arise.    Gailen Shelter,  MD Advanced Bronchoscopy PCCM [place] [facility]    *This note was dictated using voice recognition software/Dragon.  Despite best efforts to proofread, errors can occur which can change the meaning. Any transcriptional errors that result from this process are unintentional and may not be fully corrected at the time of dictation.

## 2023-06-28 NOTE — Patient Instructions (Signed)
We discussed that given your situation I would recommend continuing prophylactic Eliquis which is the dose you are on right now.  The swelling you had in your leg a few months back was related to what we call "postphlebitic syndrome" this is an issue that occurs with incompetence of the venous system in your leg after having had a clot.  We will see you in follow-up in a years time call sooner should any new problems arise.

## 2023-09-08 ENCOUNTER — Other Ambulatory Visit (INDEPENDENT_AMBULATORY_CARE_PROVIDER_SITE_OTHER): Payer: Self-pay | Admitting: Vascular Surgery

## 2023-12-21 ENCOUNTER — Other Ambulatory Visit: Payer: Self-pay | Admitting: Internal Medicine

## 2023-12-21 DIAGNOSIS — R1010 Upper abdominal pain, unspecified: Secondary | ICD-10-CM

## 2023-12-25 ENCOUNTER — Ambulatory Visit
Admission: RE | Admit: 2023-12-25 | Discharge: 2023-12-25 | Disposition: A | Discharge status: Home / Self Care | Payer: BC Managed Care – PPO | Source: Ambulatory Visit | Attending: Internal Medicine | Admitting: Internal Medicine

## 2023-12-25 DIAGNOSIS — R1010 Upper abdominal pain, unspecified: Secondary | ICD-10-CM

## 2023-12-31 ENCOUNTER — Other Ambulatory Visit: Payer: Self-pay | Admitting: Surgery

## 2023-12-31 DIAGNOSIS — R1013 Epigastric pain: Secondary | ICD-10-CM

## 2024-01-02 ENCOUNTER — Encounter
Admission: RE | Admit: 2024-01-02 | Discharge: 2024-01-02 | Disposition: A | Discharge status: Home / Self Care | Source: Ambulatory Visit | Attending: Surgery | Admitting: Surgery

## 2024-01-02 DIAGNOSIS — R1013 Epigastric pain: Secondary | ICD-10-CM | POA: Insufficient documentation

## 2024-01-02 MED ORDER — MORPHINE SULFATE (PF) 4 MG/ML IV SOLN
INTRAVENOUS | Status: AC
  Filled 2024-01-02: qty 1

## 2024-01-02 MED ORDER — MORPHINE SULFATE (PF) 4 MG/ML IV SOLN: 3.0000 mg | Freq: Once | INTRAVENOUS | Status: DC

## 2024-01-02 MED ORDER — TECHNETIUM TC 99M MEBROFENIN IV KIT
5.2500 | PACK | Freq: Once | INTRAVENOUS | Status: AC | PRN
  Administered 2024-01-02: 5.25 via INTRAVENOUS

## 2024-03-11 ENCOUNTER — Encounter (INDEPENDENT_AMBULATORY_CARE_PROVIDER_SITE_OTHER): Payer: Self-pay

## 2024-04-24 ENCOUNTER — Encounter: Payer: Self-pay | Admitting: Gastroenterology

## 2024-05-12 ENCOUNTER — Ambulatory Visit: Admitting: Anesthesiology

## 2024-05-12 ENCOUNTER — Other Ambulatory Visit: Payer: Self-pay

## 2024-05-12 ENCOUNTER — Encounter
Admission: RE | Disposition: A | Discharge status: Home / Self Care | Payer: Self-pay | Source: Home / Self Care | Attending: Gastroenterology

## 2024-05-12 ENCOUNTER — Encounter: Payer: Self-pay | Admitting: Gastroenterology

## 2024-05-12 ENCOUNTER — Ambulatory Visit
Admission: RE | Admit: 2024-05-12 | Discharge: 2024-05-12 | Disposition: A | Discharge status: Home / Self Care | Attending: Gastroenterology | Admitting: Gastroenterology

## 2024-05-12 DIAGNOSIS — Z7901 Long term (current) use of anticoagulants: Secondary | ICD-10-CM | POA: Diagnosis not present

## 2024-05-12 DIAGNOSIS — K5730 Diverticulosis of large intestine without perforation or abscess without bleeding: Secondary | ICD-10-CM | POA: Diagnosis not present

## 2024-05-12 DIAGNOSIS — D120 Benign neoplasm of cecum: Secondary | ICD-10-CM | POA: Insufficient documentation

## 2024-05-12 DIAGNOSIS — Z86711 Personal history of pulmonary embolism: Secondary | ICD-10-CM | POA: Diagnosis not present

## 2024-05-12 DIAGNOSIS — Z6839 Body mass index (BMI) 39.0-39.9, adult: Secondary | ICD-10-CM | POA: Insufficient documentation

## 2024-05-12 DIAGNOSIS — K5900 Constipation, unspecified: Secondary | ICD-10-CM | POA: Insufficient documentation

## 2024-05-12 DIAGNOSIS — Z86718 Personal history of other venous thrombosis and embolism: Secondary | ICD-10-CM | POA: Diagnosis not present

## 2024-05-12 DIAGNOSIS — Z1211 Encounter for screening for malignant neoplasm of colon: Secondary | ICD-10-CM | POA: Diagnosis present

## 2024-05-12 DIAGNOSIS — E669 Obesity, unspecified: Secondary | ICD-10-CM | POA: Insufficient documentation

## 2024-05-12 DIAGNOSIS — D123 Benign neoplasm of transverse colon: Secondary | ICD-10-CM | POA: Insufficient documentation

## 2024-05-12 DIAGNOSIS — I10 Essential (primary) hypertension: Secondary | ICD-10-CM | POA: Diagnosis not present

## 2024-05-12 HISTORY — DX: Unilateral primary osteoarthritis, left knee: M17.12

## 2024-05-12 HISTORY — DX: Saddle embolus of pulmonary artery without acute cor pulmonale: I26.92

## 2024-05-12 HISTORY — PX: POLYPECTOMY: SHX149

## 2024-05-12 HISTORY — DX: Pure hypercholesterolemia, unspecified: E78.00

## 2024-05-12 HISTORY — DX: Type 2 diabetes mellitus with unspecified complications: E11.8

## 2024-05-12 HISTORY — DX: Acute embolism and thrombosis of unspecified deep veins of unspecified lower extremity: I82.409

## 2024-05-12 HISTORY — DX: Staphylococcal arthritis, left knee: M00.062

## 2024-05-12 HISTORY — PX: COLONOSCOPY: SHX5424

## 2024-05-12 SURGERY — COLONOSCOPY
Anesthesia: General

## 2024-05-12 MED ORDER — SIMETHICONE 40 MG/0.6ML PO SUSP
ORAL | Status: DC | PRN
  Administered 2024-05-12: 60 mL

## 2024-05-12 MED ORDER — LIDOCAINE HCL (PF) 2 % IJ SOLN
INTRAMUSCULAR | Status: AC
Start: 2024-05-12 — End: 2024-05-12
  Filled 2024-05-12: qty 5

## 2024-05-12 MED ORDER — SODIUM CHLORIDE 0.9 % IV SOLN: INTRAVENOUS | Status: DC

## 2024-05-12 MED ORDER — LIDOCAINE HCL (CARDIAC) PF 100 MG/5ML IV SOSY
PREFILLED_SYRINGE | INTRAVENOUS | Status: DC | PRN
  Administered 2024-05-12: 80 mg via INTRAVENOUS

## 2024-05-12 MED ORDER — PROPOFOL 10 MG/ML IV BOLUS
INTRAVENOUS | Status: DC | PRN
  Administered 2024-05-12 (×2): 40 mg via INTRAVENOUS

## 2024-05-12 MED ORDER — EPHEDRINE SULFATE-NACL 50-0.9 MG/10ML-% IV SOSY
PREFILLED_SYRINGE | INTRAVENOUS | Status: DC | PRN
  Administered 2024-05-12: 10 mg via INTRAVENOUS

## 2024-05-12 MED ORDER — DEXMEDETOMIDINE HCL IN NACL 80 MCG/20ML IV SOLN
INTRAVENOUS | Status: DC | PRN
Start: 2024-05-12 — End: 2024-05-12
  Administered 2024-05-12: 12 ug via INTRAVENOUS
  Administered 2024-05-12: 8 ug via INTRAVENOUS

## 2024-05-12 MED ORDER — PROPOFOL 500 MG/50ML IV EMUL
INTRAVENOUS | Status: DC | PRN
  Administered 2024-05-12: 75 ug/kg/min via INTRAVENOUS

## 2024-05-12 NOTE — Transfer of Care (Signed)
 Immediate Anesthesia Transfer of Care Note  Patient: Krista Blake  Procedure(s) Performed: COLONOSCOPY POLYPECTOMY, INTESTINE  Patient Location: PACU  Anesthesia Type:General  Level of Consciousness: sedated  Airway & Oxygen Therapy: Patient Spontanous Breathing  Post-op Assessment: Post -op Vital signs reviewed and stable  Post vital signs: Reviewed and stable  Last Vitals:  Vitals Value Taken Time  BP 86/48 05/12/24 11:04  Temp 36.9 C 05/12/24 11:03  Pulse 72 05/12/24 11:07  Resp 19 05/12/24 11:07  SpO2 96 % 05/12/24 11:07  Vitals shown include unfiled device data.  Last Pain:  Vitals:   05/12/24 1103  TempSrc: Tympanic  PainSc: Asleep         Complications: No notable events documented.

## 2024-05-12 NOTE — Interval H&P Note (Signed)
 History and Physical Interval Note: Preprocedure H&P from 05/12/24  was reviewed and there was no interval change after seeing and examining the patient.  Written consent was obtained from the patient after discussion of risks, benefits, and alternatives. Patient has consented to proceed with Colonoscopy with possible intervention   05/12/2024 10:36 AM  Krista Blake No  has presented today for surgery, with the diagnosis of Colon cancer screening (Z12.11).  The various methods of treatment have been discussed with the patient and family. After consideration of risks, benefits and other options for treatment, the patient has consented to  Procedure(s): COLONOSCOPY (N/A) as a surgical intervention.  The patient's history has been reviewed, patient examined, no change in status, stable for surgery.  I have reviewed the patient's chart and labs.  Questions were answered to the patient's satisfaction.     Elspeth Ozell Jungling

## 2024-05-12 NOTE — H&P (Signed)
 Pre-Procedure H&P   Patient ID: Krista Blake is a 58 y.o. female.  Gastroenterology Provider: Elspeth Ozell Jungling, DO  Referring Provider: Jonette Primmer, PA PCP: Auston Reyes BIRCH, MD  Date: 05/12/2024  HPI [name] is a 58 y.o. female who presents today for Colonoscopy for Colorectal cancer screening .  Initial screening colonoscopy.  No family history of colon cancer or colon polyps  Patient reports constipation having 1-2 bowel movements per week without melena or hematochezia.  History of epigastric discomfort with a positive HIDA with reduced ejection fraction however patient declined cholecystectomy.  H. pylori negative  Patient's GLP-1 agonist has been held for the procedure (1 week) Patient's Eliquis  has been held for procedure (2d)  Hemoglobin 13.8 MCV 88.8 platelets 227,000 creatinine 0.7   Past Medical History:  Diagnosis Date   Acute saddle pulmonary embolism without acute cor pulmonale (HCC)    Bulging lumbar disc    DVT (deep venous thrombosis) (HCC)    History of mammogram 03/04/2015   BIRAD 1   History of Papanicolaou smear of cervix 02/09/2016   NIL/NEG   Hypertension    Pap smear abnormality of cervix/human papillomavirus (HPV) positive 02/05/2015   NIL Pap with positive HPV aptima.  HPV 16,18.45 neg   Primary osteoarthritis of left knee    Pure hypercholesterolemia    Screening for colon cancer 07/2018   neg Cologuard; repeat in 3 yrs   Staphylococcal arthritis of left knee [facility])     Past Surgical History:  Procedure Laterality Date   I & D EXTREMITY Left 05/04/2021   Procedure: KNEE ARTHROSCOPY WITH IRRIGATION AND DEBRIDEMENT;  Surgeon: Edie Norleen PARAS, MD;  Location: [facility] ORS;  Service: Orthopedics;  Laterality: Left;   KNEE ARTHROSCOPY Left 06/07/2021   Procedure: ARTHROSCOPIC IRRIGATION AND DEBRIDEMENT;  Surgeon: Edie Norleen PARAS, MD;  Location: [facility] ORS;  Service: Orthopedics;  Laterality: Left;   KNEE ARTHROSCOPY Left  08/03/2021   Procedure: LEFT KNEE REPEAT IRRIGATION AND DEBRIDEMENT WITH BONE CULTURES. ARHTROSCOPIC;  Surgeon: Edie Norleen PARAS, MD;  Location: [facility] ORS;  Service: Orthopedics;  Laterality: Left;   KNEE ARTHROSCOPY Left 08/08/2021   Procedure: ARTHROSCOPY KNEE;  Surgeon: Edie Norleen PARAS, MD;  Location: [facility] ORS;  Service: Orthopedics;  Laterality: Left;   PULMONARY THROMBECTOMY N/A 12/19/2021   Procedure: PULMONARY THROMBECTOMY;  Surgeon: Marea Selinda RAMAN, MD;  Location: [facility] INVASIVE CV LAB;  Service: Cardiovascular;  Laterality: N/A;    Family History No h/o GI disease or malignancy  Review of Systems  Constitutional:  Negative for activity change, appetite change, chills, diaphoresis, fatigue, fever and unexpected weight change.  HENT:  Negative for trouble swallowing and voice change.   Respiratory:  Negative for shortness of breath and wheezing.   Cardiovascular:  Negative for chest pain, palpitations and leg swelling.  Gastrointestinal:  Positive for constipation. Negative for abdominal distention, abdominal pain, anal bleeding, blood in stool, diarrhea, nausea, rectal pain and vomiting.  Musculoskeletal:  Negative for arthralgias and myalgias.  Skin:  Negative for color change and pallor.  Neurological:  Negative for dizziness, syncope and weakness.  Psychiatric/Behavioral:  Negative for confusion.   All other systems reviewed and are negative.    Medications No current facility-administered medications on file prior to encounter.   Current Outpatient Medications on File Prior to Encounter  Medication Sig Dispense Refill   apixaban  (ELIQUIS ) 2.5 MG TABS tablet TAKE 1 TABLET BY MOUTH TWICE A DAY 60 tablet 1   hydrochlorothiazide  (HYDRODIURIL ) 25  MG tablet Take 25 mg by mouth daily.     propranolol  (INDERAL ) 40 MG tablet Take 20 mg by mouth daily.     Semaglutide,0.25 or 0.5MG /DOS, (OZEMPIC, 0.25 OR 0.5 MG/DOSE,) 2 MG/1.5ML SOPN Inject 0.25 mg into the skin once a week.     acetaminophen   (TYLENOL ) 650 MG CR tablet Take 1,300 mg by mouth every 8 (eight) hours as needed for pain.     ADVAIR DISKUS 100-50 MCG/ACT AEPB SMARTSIG:1 Inhalation Via Inhaler Every 12 Hours (Patient not taking: Reported on 09/21/2022)     allopurinol  (ZYLOPRIM ) 100 MG tablet Take 100 mg by mouth daily.     calcium  gluconate 500 MG tablet Take 1 tablet by mouth daily.     Cholecalciferol (VITAMIN D3) 1.25 MG (50000 UT) CAPS Take 1 capsule by mouth daily.     furosemide (LASIX) 20 MG tablet Take 20 mg by mouth daily.     HYDROcodone -acetaminophen  (NORCO/VICODIN) 5-325 MG tablet Take 1-2 tablets by mouth every 4 (four) hours as needed for moderate pain or severe pain. (Patient not taking: Reported on 04/06/2022) 60 tablet 0   Magnesium  250 MG TABS Take 250 mg by mouth daily.     methocarbamol  (ROBAXIN ) 750 MG tablet Take 1 tablet (750 mg total) by mouth every 8 (eight) hours as needed for muscle spasms. (Patient not taking: Reported on 04/06/2022) 30 tablet 0   MOUNJARO 5 MG/0.5ML Pen Inject 5 mg into the skin once a week.     PARoxetine  (PAXIL ) 20 MG tablet Take 20 mg by mouth daily.     rosuvastatin  (CRESTOR ) 10 MG tablet Take 10 mg by mouth daily.      Pertinent medications related to GI and procedure were reviewed by me with the patient prior to the procedure   Current Facility-Administered Medications:    0.9 %  sodium chloride  infusion, , Intravenous, Continuous, Onita Elspeth Sharper, DO, Last Rate: 20 mL/hr at 05/12/24 0953, New Bag at 05/12/24 0953  sodium chloride  20 mL/hr at 05/12/24 9046       Allergies  Allergen Reactions   Penicillins Hives    Reaction : Hives during college TOLERATED CEFAZOLIN  AND AMOXICILLIN   Allergies were reviewed by me prior to the procedure  Objective   Body mass index is 39.34 kg/m. Vitals:   05/12/24 0950  BP: 137/82  Pulse: 96  Resp: 16  Temp: 98.2 F (36.8 C)  TempSrc: Oral  SpO2: 98%  Weight: 104 kg  Height: 5' 4 (1.626 m)     Physical  Exam Vitals and nursing note reviewed.  Constitutional:      General: She is not in acute distress.    Appearance: Normal appearance. She is obese. She is not ill-appearing, toxic-appearing or diaphoretic.  HENT:     Head: Normocephalic and atraumatic.     Nose: Nose normal.     Mouth/Throat:     Mouth: Mucous membranes are moist.     Pharynx: Oropharynx is clear.  Eyes:     General: No scleral icterus.    Extraocular Movements: Extraocular movements intact.  Cardiovascular:     Rate and Rhythm: Normal rate and regular rhythm.     Heart sounds: Normal heart sounds. No murmur heard.    No friction rub. No gallop.  Pulmonary:     Effort: Pulmonary effort is normal. No respiratory distress.     Breath sounds: Normal breath sounds. No wheezing, rhonchi or rales.  Abdominal:     General: Bowel sounds  are normal. There is no distension.     Palpations: Abdomen is soft.     Tenderness: There is no abdominal tenderness. There is no guarding or rebound.  Musculoskeletal:     Cervical back: Neck supple.     Right lower leg: No edema.     Left lower leg: No edema.  Skin:    General: Skin is warm and dry.     Coloration: Skin is not jaundiced or pale.  Neurological:     General: No focal deficit present.     Mental Status: She is alert and oriented to person, place, and time. Mental status is at baseline.  Psychiatric:        Mood and Affect: Mood normal.        Behavior: Behavior normal.        Thought Content: Thought content normal.        Judgment: Judgment normal.      Assessment:  [name] is a 58 y.o. female  who presents today for Colonoscopy for Colorectal cancer screening .  Plan:  Colonoscopy with possible intervention today  Colonoscopy with possible biopsy, control of bleeding, polypectomy, and interventions as necessary has been discussed with the patient/patient representative. Informed consent was obtained from the patient/patient representative  after explaining the indication, nature, and risks of the procedure including but not limited to death, bleeding, perforation, missed neoplasm/lesions, cardiorespiratory compromise, and reaction to medications. Opportunity for questions was given and appropriate answers were provided. Patient/patient representative has verbalized understanding is amenable to undergoing the procedure.   Elspeth Ozell Jungling, DO  [facility]  Portions of the record may have been created with voice recognition software. Occasional wrong-word or 'sound-a-like' substitutions may have occurred due to the inherent limitations of voice recognition software.  Read the chart carefully and recognize, using context, where substitutions may have occurred.

## 2024-05-12 NOTE — Op Note (Signed)
 [facility] Gastroenter[facility] Patient Name: Krista Blake Procedure Date: 05/12/2024 10:00 AM MRN: [id number] Account #: [id number] Date of Birth: 10/21/1966 Admit Type: Outpatient Age: 58 Room: [facility] ENDO ROOM 2 Gender: Female Note Status: Finalized Instrument Name: Colonoscope [id number] Procedure:             Colonoscopy Indications:           Screening for colorectal malignant neoplasm Providers:             Elspeth Ozell Onita ROSALEA, DO Referring MD:          Jacques Purchase Medicines:             Monitored Anesthesia Care Complications:         No immediate complications. Estimated blood loss:                         Minimal. Procedure:             Pre-Anesthesia Assessment:                        - Prior to the procedure, a History and Physical was                         performed, and patient medications and allergies were                         reviewed. The patient is competent. The risks and                         benefits of the procedure and the sedation options and                         risks were discussed with the patient. All questions                         were answered and informed consent was obtained.                         Patient identification and proposed procedure were                         verified by the physician, the nurse, the anesthetist                         and the technician in the endoscopy suite. Mental                         Status Examination: alert and oriented. Airway                         Examination: normal oropharyngeal airway and neck                         mobility. Respiratory Examination: clear to                         auscultation. CV Examination: RRR, no murmurs, no S3  or S4. Prophylactic Antibiotics: The patient does not                         require prophylactic antibiotics. Prior                         Anticoagulants: The patient has taken Eliquis                           (apixaban ), last dose was 2 days prior to procedure.                         ASA Grade Assessment: III - A patient with severe                         systemic disease. After reviewing the risks and                         benefits, the patient was deemed in satisfactory                         condition to undergo the procedure. The anesthesia                         plan was to use monitored anesthesia care (MAC).                         Immediately prior to administration of medications,                         the patient was re-assessed for adequacy to receive                         sedatives. The heart rate, respiratory rate, oxygen                         saturations, blood pressure, adequacy of pulmonary                         ventilation, and response to care were monitored                         throughout the procedure. The physical status of the                         patient was re-assessed after the procedure.                        After obtaining informed consent, the colonoscope was                         passed under direct vision. Throughout the procedure,                         the patient's blood pressure, pulse, and oxygen                         saturations were monitored continuously. The  Colonoscope was introduced through the anus and                         advanced to the the terminal ileum, with                         identification of the appendiceal orifice and IC                         valve. The colonoscopy was performed without                         difficulty. The patient tolerated the procedure well.                         The quality of the bowel preparation was evaluated                         using the BBPS [facility] Bowel Preparation Scale) with                         scores of: Right Colon = 3, Transverse Colon = 3 and                         Left Colon = 3 (entire mucosa seen well with no                          residual staining, small fragments of stool or opaque                         liquid). The total BBPS score equals 9. The terminal                         ileum, ileocecal valve, appendiceal orifice, and                         rectum were photographed. Findings:      The perianal and digital rectal examinations were normal. Pertinent       negatives include normal sphincter tone.      The terminal ileum appeared normal. Estimated blood loss: none.      Retroflexion in the right colon was performed.      Two sessile polyps were found in the transverse colon and cecum. The       polyps were 1 to 2 mm in size. These polyps were removed with a jumbo       cold forceps. Resection and retrieval were complete. Estimated blood       loss was minimal.      A few small-mouthed diverticula were found in the sigmoid colon and       ascending colon. Estimated blood loss: none.      The exam was otherwise without abnormality on direct and retroflexion       views. Impression:            - The examined portion of the ileum was normal.                        - Two 1 to 2 mm  polyps in the transverse colon and in                         the cecum, removed with a jumbo cold forceps. Resected                         and retrieved.                        - Diverticulosis in the sigmoid colon and in the                         ascending colon.                        - The examination was otherwise normal on direct and                         retroflexion views. Recommendation:        - Patient has a contact number available for                         emergencies. The signs and symptoms of potential                         delayed complications were discussed with the patient.                         Return to normal activities tomorrow. Written                         discharge instructions were provided to the patient.                        - Discharge patient to home.                        - Resume  previous diet.                        - Continue present medications.                        - Resume Eliquis  (apixaban ) at prior dose tomorrow.                         Refer to managing physician for further adjustment of                         therapy.                        - Await pathology results.                        - Repeat colonoscopy for surveillance based on                         pathology results.                        - Return  to referring physician as previously                         scheduled.                        - The findings and recommendations were discussed with                         the patient. Procedure Code(s):     --- Professional ---                        [phone], Colonoscopy, flexible; with biopsy, single or                         multiple Diagnosis Code(s):     --- Professional ---                        Z12.11, Encounter for screening for malignant neoplasm                         of colon                        D12.3, Benign neoplasm of transverse colon (hepatic                         flexure or splenic flexure)                        D12.0, Benign neoplasm of cecum                        K57.30, Diverticulosis of large intestine without                         perforation or abscess without bleeding CPT copyright 2022 American [employer] Association. All rights reserved. The codes documented in this report are preliminary and upon coder review may  be revised to meet current compliance requirements. Attending Participation:      I personally performed the entire procedure. Elspeth Jungling, DO Elspeth Ozell Jungling DO, DO 05/12/2024 11:05:19 AM This report has been signed electronically. Number of Addenda: 0 Note Initiated On: 05/12/2024 10:00 AM Scope Withdrawal Time: 0 hours 17 minutes 13 seconds  Total Procedure Duration: 0 hours 21 minutes 21 seconds  Estimated Blood Loss:  Estimated blood loss was minimal.      [facility]

## 2024-05-12 NOTE — Anesthesia Preprocedure Evaluation (Addendum)
 Anesthesia Evaluation  Patient identified by MRN, date of birth, ID band Patient awake    Reviewed: Allergy & Precautions, NPO status , Patient's Chart, lab work & pertinent test results  History of Anesthesia Complications Negative for: history of anesthetic complications  Airway Mallampati: II   Neck ROM: Full    Dental no notable dental hx.    Pulmonary neg pulmonary ROS   Pulmonary exam normal breath sounds clear to auscultation       Cardiovascular hypertension, Normal cardiovascular exam Rhythm:Regular Rate:Normal  Hx PE/DVT on Eliquis    Echo 02/23/22:  NORMAL LEFT VENTRICULAR SYSTOLIC FUNCTION  NORMAL RIGHT VENTRICULAR SYSTOLIC FUNCTION  MILD VALVULAR REGURGITATION   NO VALVULAR STENOSIS    Neuro/Psych Chronic pain    GI/Hepatic negative GI ROS,,,  Endo/Other  Obesity  Renal/GU negative Renal ROS     Musculoskeletal  (+) Arthritis ,    Abdominal   Peds  Hematology negative hematology ROS (+)   Anesthesia Other Findings Last dose of Ozempic 05/05/24.  Reproductive/Obstetrics                              Anesthesia Physical Anesthesia Plan  ASA: 3  Anesthesia Plan: General   Post-op Pain Management:    Induction: Intravenous  PONV Risk Score and Plan: 3 and Propofol  infusion, TIVA and Treatment may vary due to age or medical condition  Airway Management Planned: Natural Airway  Additional Equipment:   Intra-op Plan:   Post-operative Plan:   Informed Consent: I have reviewed the patients History and Physical, chart, labs and discussed the procedure including the risks, benefits and alternatives for the proposed anesthesia with the patient or authorized representative who has indicated his/her understanding and acceptance.       Plan Discussed with: CRNA  Anesthesia Plan Comments: (LMA/GETA backup discussed.  Patient consented for risks of anesthesia including  but not limited to:  - adverse reactions to medications - damage to eyes, teeth, lips or other oral mucosa - nerve damage due to positioning  - sore throat or hoarseness - damage to heart, brain, nerves, lungs, other parts of body or loss of life  Informed patient about role of CRNA in peri- and intra-operative care.  Patient voiced understanding.)         Anesthesia Quick Evaluation

## 2024-05-12 NOTE — Anesthesia Postprocedure Evaluation (Signed)
 Anesthesia Post Note  Patient: Krista Blake  Procedure(s) Performed: COLONOSCOPY POLYPECTOMY, INTESTINE  Patient location during evaluation: PACU Anesthesia Type: General Level of consciousness: awake and alert Pain management: pain level controlled Vital Signs Assessment: post-procedure vital signs reviewed and stable Respiratory status: spontaneous breathing, nonlabored ventilation and respiratory function stable Cardiovascular status: blood pressure returned to baseline and stable Postop Assessment: no apparent nausea or vomiting Anesthetic complications: no   No notable events documented.   Last Vitals:  Vitals:   05/12/24 1113 05/12/24 1123  BP: 103/61 104/63  Pulse: 83 81  Resp: 17 17  Temp:    SpO2: 95% 98%    Last Pain:  Vitals:   05/12/24 1123  TempSrc:   PainSc: 0-No pain                 Camellia Merilee Louder

## 2024-05-13 ENCOUNTER — Encounter: Payer: Self-pay | Admitting: Gastroenterology

## 2024-05-13 LAB — SURGICAL PATHOLOGY

## 2024-07-11 ENCOUNTER — Other Ambulatory Visit: Payer: Self-pay | Admitting: Internal Medicine

## 2024-07-11 DIAGNOSIS — Z1231 Encounter for screening mammogram for malignant neoplasm of breast: Secondary | ICD-10-CM

## 2024-10-29 ENCOUNTER — Encounter

## 2024-10-30 ENCOUNTER — Ambulatory Visit: Admitting: Pulmonary Disease

## 2024-11-21 ENCOUNTER — Ambulatory Visit
Admission: RE | Admit: 2024-11-21 | Discharge: 2024-11-21 | Disposition: A | Source: Ambulatory Visit | Attending: Internal Medicine | Admitting: Internal Medicine

## 2024-11-21 DIAGNOSIS — Z1231 Encounter for screening mammogram for malignant neoplasm of breast: Secondary | ICD-10-CM | POA: Diagnosis present

## 2024-12-11 ENCOUNTER — Ambulatory Visit: Admitting: Pulmonary Disease
